# Patient Record
Sex: Female | Born: 1950 | Race: White | Hispanic: No | Smoking: Former smoker
Health system: Southern US, Community
[De-identification: ages and names within clinical notes are randomized; demographics above are authoritative.]

## PROBLEM LIST (undated history)

## (undated) DIAGNOSIS — I7300 Raynaud's syndrome without gangrene: Secondary | ICD-10-CM

## (undated) DIAGNOSIS — I495 Sick sinus syndrome: Secondary | ICD-10-CM

## (undated) DIAGNOSIS — Z950 Presence of cardiac pacemaker: Secondary | ICD-10-CM

## (undated) DIAGNOSIS — G4730 Sleep apnea, unspecified: Secondary | ICD-10-CM

## (undated) DIAGNOSIS — R9439 Abnormal result of other cardiovascular function study: Secondary | ICD-10-CM

## (undated) DIAGNOSIS — I480 Paroxysmal atrial fibrillation: Secondary | ICD-10-CM

## (undated) DIAGNOSIS — I499 Cardiac arrhythmia, unspecified: Secondary | ICD-10-CM

## (undated) DIAGNOSIS — K219 Gastro-esophageal reflux disease without esophagitis: Secondary | ICD-10-CM

## (undated) HISTORY — DX: Paroxysmal atrial fibrillation: I48.0

## (undated) HISTORY — PX: BUNIONECTOMY WITH WEIL OSTEOTOMY: SHX5604

## (undated) HISTORY — PX: BACK SURGERY: SHX140

## (undated) HISTORY — DX: Abnormal result of other cardiovascular function study: R94.39

## (undated) HISTORY — PX: CHOLECYSTECTOMY: SHX55

## (undated) HISTORY — DX: Raynaud's syndrome without gangrene: I73.00

## (undated) HISTORY — PX: TONSILLECTOMY: SUR1361

---

## 2003-10-10 HISTORY — PX: BREAST EXCISIONAL BIOPSY: SUR124

## 2004-05-31 ENCOUNTER — Encounter: Payer: Self-pay | Admitting: Cardiology

## 2009-06-01 ENCOUNTER — Encounter: Payer: Self-pay | Admitting: Cardiology

## 2009-08-12 ENCOUNTER — Encounter: Payer: Self-pay | Admitting: Cardiology

## 2009-08-17 ENCOUNTER — Encounter: Payer: Self-pay | Admitting: Cardiology

## 2009-08-17 ENCOUNTER — Ambulatory Visit: Payer: Self-pay | Admitting: Cardiology

## 2009-09-16 DIAGNOSIS — I10 Essential (primary) hypertension: Secondary | ICD-10-CM | POA: Insufficient documentation

## 2009-09-16 DIAGNOSIS — R002 Palpitations: Secondary | ICD-10-CM | POA: Insufficient documentation

## 2009-09-16 DIAGNOSIS — R0789 Other chest pain: Secondary | ICD-10-CM

## 2009-09-17 ENCOUNTER — Ambulatory Visit: Payer: Self-pay | Admitting: Cardiology

## 2011-10-10 HISTORY — PX: CARDIAC CATHETERIZATION: SHX172

## 2012-06-28 ENCOUNTER — Other Ambulatory Visit: Payer: Self-pay | Admitting: Cardiology

## 2012-06-28 ENCOUNTER — Encounter: Payer: Self-pay | Admitting: Cardiology

## 2012-06-28 DIAGNOSIS — R079 Chest pain, unspecified: Secondary | ICD-10-CM

## 2012-07-03 ENCOUNTER — Encounter: Payer: Self-pay | Admitting: Cardiology

## 2012-07-03 DIAGNOSIS — R079 Chest pain, unspecified: Secondary | ICD-10-CM

## 2012-08-09 ENCOUNTER — Ambulatory Visit (INDEPENDENT_AMBULATORY_CARE_PROVIDER_SITE_OTHER): Payer: BC Managed Care – PPO | Admitting: Cardiology

## 2012-08-09 ENCOUNTER — Encounter: Payer: Self-pay | Admitting: Cardiology

## 2012-08-09 ENCOUNTER — Encounter: Payer: Self-pay | Admitting: *Deleted

## 2012-08-09 ENCOUNTER — Other Ambulatory Visit: Payer: Self-pay | Admitting: Cardiology

## 2012-08-09 VITALS — BP 134/76 | HR 61 | Ht 69.0 in | Wt 180.0 lb

## 2012-08-09 DIAGNOSIS — R0789 Other chest pain: Secondary | ICD-10-CM

## 2012-08-09 DIAGNOSIS — R9439 Abnormal result of other cardiovascular function study: Secondary | ICD-10-CM | POA: Insufficient documentation

## 2012-08-09 DIAGNOSIS — R002 Palpitations: Secondary | ICD-10-CM

## 2012-08-09 MED ORDER — PANTOPRAZOLE SODIUM 40 MG PO TBEC: 40.0000 mg | DELAYED_RELEASE_TABLET | Freq: Every day | ORAL | Status: DC

## 2012-08-09 NOTE — Assessment & Plan Note (Signed)
Could be "false-positive" ST segment changes as detailed above. Needs further investigation.

## 2012-08-09 NOTE — Patient Instructions (Addendum)
Your physician recommends that you schedule a follow-up appointment in: 1 month. Your physician recommends that you continue on your current medications as directed. You have been provided a prescription for pantoprazole 40 mg daily.Please refer to the Current Medication list given to you today.  Your physician has requested that you have a stress echocardiogram. For further information please visit [email or web address] Please follow instruction sheet as given.

## 2012-08-09 NOTE — Progress Notes (Signed)
Clinical Summary Tiffany Melton is a 61 y.o.female referred back to the office by Dr. Dimas Aguas. She was seen previously by Dr. Myrtis Ser in December 2010 and has had history of chest pain as well as palpitations over the years. She had a low-risk nuclear imaging stress test at the time of her prior evaluation (abnormal ST segment changes but reassuring perfusion images), and no further cardiac workup was recommended at that time.  More recently she was admitted to [facility] back in September with recurrent atypical chest pain. She ruled out for myocardial infarction and was referred for a followup GXT on 9/25. She attained adequate heart rate response, 10.1 METs, dyspnea but no chest pain. Abnormal ST segment changes were noted reaching 3 mm depression in leads II, III, aVF, and V4 through V6 at peak heart rate, resolution 5 minutes. Hypertensive response noted.  We reviewed the results of her GXT today. She described her symptoms to me, tends to be a fairly sporadic chest pain, associated with feeling of palpitations, sometimes left arm discomfort. These episodes are nonexertional. Most recent one occurred in association with definite reflux symptoms with waterbrash. She took her husband's nitroglycerin with questionable relief, also took some of his Protonix.   No Known Allergies  Current Outpatient Prescriptions  Medication Sig Dispense Refill  . nitroGLYCERIN (NITROSTAT) 0.4 MG SL tablet Place 0.4 mg under the tongue every 5 (five) minutes as needed.      . pantoprazole (PROTONIX) 40 MG tablet Take 1 tablet (40 mg total) by mouth daily.  30 tablet  3  . DISCONTD: pantoprazole (PROTONIX) 40 MG tablet Take 40 mg by mouth daily.        Past Medical History  Diagnosis Date  . Palpitations   . Essential hypertension, benign   . Raynaud's disease   . Abnormal stress test     GXT 9/13    No past surgical history on file.  Family History  Problem Relation Age of Onset  . Hypertension      Social  History [name] reports that she quit smoking about 30 years ago. Her smoking use included Cigarettes. She started smoking about 49 years ago. She has a 30 pack-year smoking history. She has never used smokeless tobacco. [name] reports that she does not drink alcohol.  Review of Systems No syncope. No orthopnea or PND. No claudication. No fevers or chills. No cough. Otherwise negative.  Physical Examination Filed Vitals:   08/09/12 1350  BP: 134/76  Pulse: 61   Filed Weights   08/09/12 1350  Weight: 180 lb (81.647 kg)   Patient in no acute distress. HEENT: Conjunctiva and lids normal, oropharynx clear with moist mucosa. Neck: Supple, no elevated JVP or carotid bruits, no thyromegaly. Lungs: Clear to auscultation, nonlabored breathing at rest. Cardiac: Regular rate and rhythm, no S3 or significant systolic murmur, no pericardial rub. Abdomen: Soft, nontender, bowel sounds present. Extremities: No pitting edema, distal pulses 2+. Skin: Warm and dry. Musculoskeletal: No kyphosis. Neuropsychiatric: Alert and oriented x3, affect grossly appropriate.   Problem List and Plan   Atypical chest pain Sporadic, prolonged at times, sometimes associated with feeling of palpitations and left arm discomfort. One episode was associated with fairly typical reflux. She did have a recent abnormal GXT as noted, however back in 2010 also had abnormal ST segment changes with a reassuring myocardial perfusion study. Our plan is to initiate Protonix as empiric treatment for possible reflux/esophagitis, and refer her for an exercise echocardiogram to better  clarify possible ischemic etiology. If her echocardiographic images are reassuring, it is likely that the ST segment changes are a "false positive." If both imaging and ECG are more clearly abnormal, then we will consider cardiac catheterization. Office followup arranged.  Abnormal stress test Could be "false-positive" ST segment changes as detailed  above. Needs further investigation.  PALPITATIONS Possibility of transient arrhythmia is also to be considered. Once ischemic workup is complete, we might consider an outpatient monitor.    Jonelle Sidle, M.D., F.A.C.C.

## 2012-08-09 NOTE — Assessment & Plan Note (Signed)
Sporadic, prolonged at times, sometimes associated with feeling of palpitations and left arm discomfort. One episode was associated with fairly typical reflux. She did have a recent abnormal GXT as noted, however back in 2010 also had abnormal ST segment changes with a reassuring myocardial perfusion study. Our plan is to initiate Protonix as empiric treatment for possible reflux/esophagitis, and refer her for an exercise echocardiogram to better clarify possible ischemic etiology. If her echocardiographic images are reassuring, it is likely that the ST segment changes are a "false positive." If both imaging and ECG are more clearly abnormal, then we will consider cardiac catheterization. Office followup arranged.

## 2012-08-09 NOTE — Assessment & Plan Note (Signed)
Possibility of transient arrhythmia is also to be considered. Once ischemic workup is complete, we might consider an outpatient monitor.

## 2012-08-13 DIAGNOSIS — R079 Chest pain, unspecified: Secondary | ICD-10-CM

## 2012-08-16 ENCOUNTER — Telehealth: Payer: Self-pay | Admitting: Cardiology

## 2012-08-16 NOTE — Telephone Encounter (Signed)
The only other cardiac test to be considered now would be a diagnostic heart catheterization, which we did discuss briefly at her office visit. As I have already mentioned, her most recent exercise echocardiogram does not suggest that we will find significant obstructive CAD based on the echocardiographic images. If however she continues to have chest pain despite PPI and remains concerned, I would prefer to actually speak with her in the office for scheduling an invasive procedure that has risks and need to be clearly understood in advance.

## 2012-08-16 NOTE — Telephone Encounter (Signed)
Patient informed and states she continues to have the chest pain and its not any worse than before. Patient requesting to have any further testing that would be needed if suggested by MD without having to come back to office in between due to $70 copay. Nurse informed patient that follow up appointment is recommended and that MD would be notified of patients continued chest pain with protonix and concern about copay.

## 2012-08-16 NOTE — Telephone Encounter (Signed)
Patient called asking about results of stress test.  She states she is still having chest pain that she told Dr. Diona Browner about and states heart rate increases.  Patient indicated her copay is 70.00 and wanting to see if any additional workup is needed and can be done before the end of the year as her deductible is already met.

## 2012-08-16 NOTE — Telephone Encounter (Signed)
Message copied by Eustace Moore on Fri Aug 16, 2012  9:38 AM ------      Message from: MCDOWELL, Illene Bolus      Created: Wed Aug 14, 2012  8:48 AM       Study was reviewed. Although she has abnormal ST segment changes with exercise, documented on more than one occasion, her stress echocardiographic images were normal arguing against major obstructive CAD. Overall this is reassuring. Have her keep her followup visit to she how she is doing on Protonix.

## 2012-08-16 NOTE — Telephone Encounter (Signed)
Patient informed and verbalized understanding of plan. 

## 2012-08-26 ENCOUNTER — Encounter: Payer: Self-pay | Admitting: Cardiology

## 2012-08-26 ENCOUNTER — Encounter: Payer: Self-pay | Admitting: *Deleted

## 2012-08-26 ENCOUNTER — Telehealth: Payer: Self-pay | Admitting: Cardiology

## 2012-08-26 ENCOUNTER — Other Ambulatory Visit: Payer: Self-pay | Admitting: Cardiology

## 2012-08-26 ENCOUNTER — Ambulatory Visit (INDEPENDENT_AMBULATORY_CARE_PROVIDER_SITE_OTHER): Payer: BC Managed Care – PPO | Admitting: Cardiology

## 2012-08-26 VITALS — BP 131/82 | HR 69 | Ht 69.0 in | Wt 175.0 lb

## 2012-08-26 DIAGNOSIS — R072 Precordial pain: Secondary | ICD-10-CM

## 2012-08-26 DIAGNOSIS — R002 Palpitations: Secondary | ICD-10-CM

## 2012-08-26 DIAGNOSIS — Z01810 Encounter for preprocedural cardiovascular examination: Secondary | ICD-10-CM

## 2012-08-26 DIAGNOSIS — R9439 Abnormal result of other cardiovascular function study: Secondary | ICD-10-CM

## 2012-08-26 DIAGNOSIS — R0789 Other chest pain: Secondary | ICD-10-CM

## 2012-08-26 LAB — PROTIME-INR

## 2012-08-26 MED ORDER — ASPIRIN EC 81 MG PO TBEC: 81.0000 mg | DELAYED_RELEASE_TABLET | Freq: Every day | ORAL | Status: DC

## 2012-08-26 NOTE — Progress Notes (Signed)
 Clinical Summary Ms. Tobia is a 61 y.o.female presenting for office followup. She was seen in early November for followup of an abnormal GXT done back in September. She was reporting intermittent chest pain symptoms, fairly sporadic, also sense of palpitations and left arm discomfort. She had tried some of her husband's nitroglycerin and also Protonix.  We discussed followup testing, and proceeded with an exercise echocardiogram for more diagnostic sensitivity and specificity. She developed abnormal ST segment changes, approximately 3 mm ST segment depression in leads II, III, aVF, and V4 through V6 at peak with rare PVC and hypertensive response. No anginal chest pain was reported. The echocardiographic images were reassuring however demonstrating no inducible wall motion abnormalities with LVEF greater than 65%.  She presents today stating that she continues to have intermittent chest "burning and pressure," remains sporadic, not clearly exertional. The Protonix may have helped some but not completely. She also reports nitroglycerin responsiveness.  We reviewed the results of her stress testing, and in light of the fact she continues to manifest symptoms and remains very worried about her heart, plan is to pursue a diagnostic cardiac catheterization to clearly exclude the possibility of obstructive CAD that may need further management beyond medications. If this is not the case however, GI evaluation may be the next step.  No Known Allergies  Current Outpatient Prescriptions  Medication Sig Dispense Refill  . nitroGLYCERIN (NITROSTAT) 0.4 MG SL tablet Place 0.4 mg under the tongue every 5 (five) minutes as needed.      . pantoprazole (PROTONIX) 40 MG tablet Take 1 tablet (40 mg total) by mouth daily.  30 tablet  3  . aspirin EC 81 MG tablet Take 1 tablet (81 mg total) by mouth daily.        Past Medical History  Diagnosis Date  . Palpitations   . Essential hypertension, benign   .  Raynaud's disease   . Abnormal stress test     GXT 9/13    No past surgical history on file.  Family History  Problem Relation Age of Onset  . Hypertension      Social History Ms. Saine reports that she quit smoking about 30 years ago. Her smoking use included Cigarettes. She started smoking about 49 years ago. She has a 30 pack-year smoking history. She has never used smokeless tobacco. Ms. Erpelding reports that she does not drink alcohol.  Review of Systems She has cut back on caffeine, no progressive palpitations. Reports anxiety at times. No other major stressors. Otherwise negative.  Physical Examination Filed Vitals:   08/26/12 1050  BP: 131/82  Pulse: 69   Filed Weights   08/26/12 1050  Weight: 175 lb (79.379 kg)   Patient in no acute distress.  HEENT: Conjunctiva and lids normal, oropharynx clear with moist mucosa.  Neck: Supple, no elevated JVP or carotid bruits, no thyromegaly.  Lungs: Clear to auscultation, nonlabored breathing at rest.  Cardiac: Regular rate and rhythm, no S3 or significant systolic murmur, no pericardial rub.  Abdomen: Soft, nontender, bowel sounds present.  Extremities: No pitting edema, distal pulses 2+.  Skin: Warm and dry.  Musculoskeletal: No kyphosis.  Neuropsychiatric: Alert and oriented x3, affect grossly appropriate.  Problem List and Plan   Atypical chest pain Symptoms persist on Protonix, and patient reports having had responsiveness to nitroglycerin which she borrowed from her husband. She has clearly abnormal ST segment changes by stress testing, however recent stress echocardiogram showed normal echocardiographic images suggesting that these ST segment   changes may be "false positive." Having said that, she continues to manifest symptoms and remains very worried about her heart. We discussed the risks and benefits of diagnostic cardiac catheterization to clearly exclude obstructive CAD that may require further management, and she is in  agreement to proceed. Aspirin is being started, and baseline lab work be obtained. She has had a recent chest x-ray at [facility]. If her cardiac evaluation is reassuring, she may need GI evaluation.  Abnormal stress test Exercise echocardiogram noted above. Clearly abnormal ST segment changes, however reassuring echocardiographic images.  PALPITATIONS Reports no clear correlation with chest discomfort symptoms. She has cut back on caffeine. At this point arrhythmia not documented.    Harles Evetts G. Karmelo Bass, M.D., F.A.C.C.   

## 2012-08-26 NOTE — Assessment & Plan Note (Signed)
Exercise echocardiogram noted above. Clearly abnormal ST segment changes, however reassuring echocardiographic images.

## 2012-08-26 NOTE — Patient Instructions (Addendum)
   Left JV heart cath - see info sheet given  Begin Aspirin 81mg  daily Continue all other current medications. Follow up to be given after procedure above

## 2012-08-26 NOTE — Telephone Encounter (Signed)
Pt has [employer], no precert for heart cath.

## 2012-08-26 NOTE — Telephone Encounter (Signed)
Heart catherization scheduled for 08-30-12 by Dr. Antoine Poche JV lab 9:30am

## 2012-08-26 NOTE — Assessment & Plan Note (Signed)
Symptoms persist on Protonix, and patient reports having had responsiveness to nitroglycerin which she borrowed from her husband. She has clearly abnormal ST segment changes by stress testing, however recent stress echocardiogram showed normal echocardiographic images suggesting that these ST segment changes may be "false positive." Having said that, she continues to manifest symptoms and remains very worried about her heart. We discussed the risks and benefits of diagnostic cardiac catheterization to clearly exclude obstructive CAD that may require further management, and she is in agreement to proceed. Aspirin is being started, and baseline lab work be obtained. She has had a recent chest x-ray at [facility]. If her cardiac evaluation is reassuring, she may need GI evaluation.

## 2012-08-26 NOTE — Assessment & Plan Note (Signed)
Reports no clear correlation with chest discomfort symptoms. She has cut back on caffeine. At this point arrhythmia not documented.

## 2012-08-30 ENCOUNTER — Inpatient Hospital Stay (HOSPITAL_BASED_OUTPATIENT_CLINIC_OR_DEPARTMENT_OTHER)
Admission: RE | Admit: 2012-08-30 | Discharge: 2012-08-30 | Disposition: A | Discharge status: Home / Self Care | Payer: BC Managed Care – PPO | Source: Ambulatory Visit | Attending: Cardiology | Admitting: Cardiology

## 2012-08-30 ENCOUNTER — Encounter (HOSPITAL_BASED_OUTPATIENT_CLINIC_OR_DEPARTMENT_OTHER)
Admission: RE | Disposition: A | Discharge status: Home / Self Care | Payer: Self-pay | Source: Ambulatory Visit | Attending: Cardiology

## 2012-08-30 DIAGNOSIS — R072 Precordial pain: Secondary | ICD-10-CM

## 2012-08-30 DIAGNOSIS — I10 Essential (primary) hypertension: Secondary | ICD-10-CM | POA: Insufficient documentation

## 2012-08-30 DIAGNOSIS — I7300 Raynaud's syndrome without gangrene: Secondary | ICD-10-CM | POA: Insufficient documentation

## 2012-08-30 DIAGNOSIS — R079 Chest pain, unspecified: Secondary | ICD-10-CM | POA: Insufficient documentation

## 2012-08-30 SURGERY — JV LEFT HEART CATHETERIZATION WITH CORONARY ANGIOGRAM
Anesthesia: Moderate Sedation

## 2012-08-30 MED ORDER — ASPIRIN 81 MG PO CHEW
324.0000 mg | CHEWABLE_TABLET | ORAL | Status: AC
  Administered 2012-08-30: 324 mg via ORAL

## 2012-08-30 MED ORDER — SODIUM CHLORIDE 0.9 % IV SOLN: INTRAVENOUS | Status: DC

## 2012-08-30 MED ORDER — DIAZEPAM 5 MG PO TABS
5.0000 mg | ORAL_TABLET | ORAL | Status: AC
  Administered 2012-08-30: 5 mg via ORAL

## 2012-08-30 MED ORDER — SODIUM CHLORIDE 0.9 % IV SOLN: 1.0000 mL/kg/h | INTRAVENOUS | Status: DC

## 2012-08-30 MED ORDER — ONDANSETRON HCL 4 MG/2ML IJ SOLN: 4.0000 mg | Freq: Four times a day (QID) | INTRAMUSCULAR | Status: DC | PRN

## 2012-08-30 MED ORDER — ACETAMINOPHEN 325 MG PO TABS: 650.0000 mg | ORAL_TABLET | ORAL | Status: DC | PRN

## 2012-08-30 NOTE — OR Nursing (Signed)
Discharge instructions reviewed and signed, pt stated understanding, ambulated in hall without difficulty, site level 0, transported to son's car via wheelchair 

## 2012-08-30 NOTE — OR Nursing (Signed)
Dr Hochrein at bedside to discuss results and treatment plan with pt and family 

## 2012-08-30 NOTE — OR Nursing (Signed)
Tegaderm dressing applied, site level 0, bedrest begins at 100

## 2012-08-30 NOTE — OR Nursing (Signed)
Bedrest begins at 1055

## 2012-08-30 NOTE — Interval H&P Note (Signed)
History and Physical Interval Note:  08/30/2012 10:52 AM  Tiffany Melton  has presented today for surgery, with the diagnosis of abnormal stress, chest pain  The various methods of treatment have been discussed with the patient and family. After consideration of risks, benefits and other options for treatment, the patient has consented to  Procedure(s) (LRB) with comments: JV LEFT HEART CATHETERIZATION WITH CORONARY ANGIOGRAM (N/A) as a surgical intervention .  The patient's history has been reviewed, patient examined, no change in status, stable for surgery.  I have reviewed the patient's chart and labs.  Questions were answered to the patient's satisfaction.     Rollene Rotunda

## 2012-08-30 NOTE — CV Procedure (Signed)
  Cardiac Catheterization Procedure Note  Name: EMRYN HIOTT MRN: [id number] DOB: [birth year]  Procedure: Left Heart Cath, Selective Coronary Angiography, LV angiography  Indication:  Chest pain suggestive of unstable angina.   Procedural details: The right groin was prepped, draped, and anesthetized with 1% lidocaine. Using modified Seldinger technique, a 4 French sheath was introduced into the right femoral artery. Standard Judkins catheters were used for coronary angiography and left ventriculography. Catheter exchanges were performed over a guidewire. There were no immediate procedural complications. The patient was transferred to the post catheterization recovery area for further monitoring.  I did attempt radial access.  However, I was unable to cannulate the radial artery.    Procedural Findings:   Hemodynamics:     AO 123/88    LV 127/12   Coronary angiography:   Coronary dominance: Right  Left mainstem:   LM normal  Left anterior descending (LAD):   Moderate sized vessel.  Normal throughout.  Mid diagonal large and normal.  Left circumflex (LCx):  AV groove normal.  MOM moderate sized and normal.  PL small and normal.   Right coronary artery (RCA):  Very large and dominant.  Normal.  PDA large and normal.  PL moderate sized and normal  Left ventriculography: Left ventricular systolic function is normal, LVEF is estimated at 65%, there is no significant mitral regurgitation   Final Conclusions:  Normal coronaries.  NL LV function.  Recommendations: No further cardiac work up.    Rollene Rotunda 08/30/2012, 10:43 AM

## 2012-08-30 NOTE — H&P (View-Only) (Signed)
Clinical Summary Tiffany Melton is a 61 y.o.female presenting for office followup. She was seen in early November for followup of an abnormal GXT done back in September. She was reporting intermittent chest pain symptoms, fairly sporadic, also sense of palpitations and left arm discomfort. She had tried some of her husband's nitroglycerin and also Protonix.  We discussed followup testing, and proceeded with an exercise echocardiogram for more diagnostic sensitivity and specificity. She developed abnormal ST segment changes, approximately 3 mm ST segment depression in leads II, III, aVF, and V4 through V6 at peak with rare PVC and hypertensive response. No anginal chest pain was reported. The echocardiographic images were reassuring however demonstrating no inducible wall motion abnormalities with LVEF greater than 65%.  She presents today stating that she continues to have intermittent chest "burning and pressure," remains sporadic, not clearly exertional. The Protonix may have helped some but not completely. She also reports nitroglycerin responsiveness.  We reviewed the results of her stress testing, and in light of the fact she continues to manifest symptoms and remains very worried about her heart, plan is to pursue a diagnostic cardiac catheterization to clearly exclude the possibility of obstructive CAD that may need further management beyond medications. If this is not the case however, GI evaluation may be the next step.  No Known Allergies  Current Outpatient Prescriptions  Medication Sig Dispense Refill  . nitroGLYCERIN (NITROSTAT) 0.4 MG SL tablet Place 0.4 mg under the tongue every 5 (five) minutes as needed.      . pantoprazole (PROTONIX) 40 MG tablet Take 1 tablet (40 mg total) by mouth daily.  30 tablet  3  . aspirin EC 81 MG tablet Take 1 tablet (81 mg total) by mouth daily.        Past Medical History  Diagnosis Date  . Palpitations   . Essential hypertension, benign   .  Raynaud's disease   . Abnormal stress test     GXT 9/13    No past surgical history on file.  Family History  Problem Relation Age of Onset  . Hypertension      Social History Ms. Anagnos reports that she quit smoking about 30 years ago. Her smoking use included Cigarettes. She started smoking about 49 years ago. She has a 30 pack-year smoking history. She has never used smokeless tobacco. Ms. Frederking reports that she does not drink alcohol.  Review of Systems She has cut back on caffeine, no progressive palpitations. Reports anxiety at times. No other major stressors. Otherwise negative.  Physical Examination Filed Vitals:   08/26/12 1050  BP: 131/82  Pulse: 69   Filed Weights   08/26/12 1050  Weight: 175 lb (79.379 kg)   Patient in no acute distress.  HEENT: Conjunctiva and lids normal, oropharynx clear with moist mucosa.  Neck: Supple, no elevated JVP or carotid bruits, no thyromegaly.  Lungs: Clear to auscultation, nonlabored breathing at rest.  Cardiac: Regular rate and rhythm, no S3 or significant systolic murmur, no pericardial rub.  Abdomen: Soft, nontender, bowel sounds present.  Extremities: No pitting edema, distal pulses 2+.  Skin: Warm and dry.  Musculoskeletal: No kyphosis.  Neuropsychiatric: Alert and oriented x3, affect grossly appropriate.  Problem List and Plan   Atypical chest pain Symptoms persist on Protonix, and patient reports having had responsiveness to nitroglycerin which she borrowed from her husband. She has clearly abnormal ST segment changes by stress testing, however recent stress echocardiogram showed normal echocardiographic images suggesting that these ST segment  changes may be "false positive." Having said that, she continues to manifest symptoms and remains very worried about her heart. We discussed the risks and benefits of diagnostic cardiac catheterization to clearly exclude obstructive CAD that may require further management, and she is in  agreement to proceed. Aspirin is being started, and baseline lab work be obtained. She has had a recent chest x-ray at [facility]. If her cardiac evaluation is reassuring, she may need GI evaluation.  Abnormal stress test Exercise echocardiogram noted above. Clearly abnormal ST segment changes, however reassuring echocardiographic images.  PALPITATIONS Reports no clear correlation with chest discomfort symptoms. She has cut back on caffeine. At this point arrhythmia not documented.    Jonelle Sidle, M.D., F.A.C.C.

## 2012-09-19 ENCOUNTER — Ambulatory Visit: Payer: BC Managed Care – PPO | Admitting: Cardiology

## 2012-11-23 ENCOUNTER — Other Ambulatory Visit: Payer: Self-pay

## 2013-08-14 ENCOUNTER — Other Ambulatory Visit: Payer: Self-pay

## 2014-04-30 ENCOUNTER — Encounter (HOSPITAL_COMMUNITY): Payer: Self-pay | Admitting: *Deleted

## 2014-04-30 ENCOUNTER — Inpatient Hospital Stay (HOSPITAL_COMMUNITY)
Admission: AD | Admit: 2014-04-30 | Discharge: 2014-05-05 | DRG: 243 | Disposition: A | Discharge status: Home / Self Care | Payer: BC Managed Care – PPO | Source: Other Acute Inpatient Hospital | Attending: Cardiology | Admitting: Cardiology

## 2014-04-30 DIAGNOSIS — I10 Essential (primary) hypertension: Secondary | ICD-10-CM | POA: Diagnosis present

## 2014-04-30 DIAGNOSIS — R55 Syncope and collapse: Secondary | ICD-10-CM

## 2014-04-30 DIAGNOSIS — Z7982 Long term (current) use of aspirin: Secondary | ICD-10-CM

## 2014-04-30 DIAGNOSIS — I455 Other specified heart block: Secondary | ICD-10-CM

## 2014-04-30 DIAGNOSIS — I4891 Unspecified atrial fibrillation: Secondary | ICD-10-CM

## 2014-04-30 DIAGNOSIS — R079 Chest pain, unspecified: Secondary | ICD-10-CM

## 2014-04-30 DIAGNOSIS — Z87891 Personal history of nicotine dependence: Secondary | ICD-10-CM

## 2014-04-30 DIAGNOSIS — Z79899 Other long term (current) drug therapy: Secondary | ICD-10-CM

## 2014-04-30 DIAGNOSIS — I959 Hypotension, unspecified: Secondary | ICD-10-CM

## 2014-04-30 DIAGNOSIS — R002 Palpitations: Secondary | ICD-10-CM

## 2014-04-30 DIAGNOSIS — I480 Paroxysmal atrial fibrillation: Secondary | ICD-10-CM

## 2014-04-30 DIAGNOSIS — I7300 Raynaud's syndrome without gangrene: Secondary | ICD-10-CM | POA: Diagnosis present

## 2014-04-30 DIAGNOSIS — I4892 Unspecified atrial flutter: Secondary | ICD-10-CM | POA: Diagnosis present

## 2014-04-30 DIAGNOSIS — I495 Sick sinus syndrome: Principal | ICD-10-CM | POA: Diagnosis present

## 2014-04-30 HISTORY — DX: Sick sinus syndrome: I49.5

## 2014-04-30 MED ORDER — SODIUM CHLORIDE 0.9 % IV SOLN
INTRAVENOUS | Status: DC
  Administered 2014-04-30: 23:00:00 via INTRAVENOUS

## 2014-04-30 MED ORDER — POTASSIUM CHLORIDE CRYS ER 20 MEQ PO TBCR
40.0000 meq | EXTENDED_RELEASE_TABLET | Freq: Once | ORAL | Status: AC
  Administered 2014-05-01: 40 meq via ORAL
  Filled 2014-04-30: qty 2

## 2014-05-01 ENCOUNTER — Inpatient Hospital Stay (HOSPITAL_COMMUNITY): Payer: BC Managed Care – PPO

## 2014-05-01 ENCOUNTER — Encounter (HOSPITAL_COMMUNITY): Payer: Self-pay | Admitting: Internal Medicine

## 2014-05-01 DIAGNOSIS — R079 Chest pain, unspecified: Secondary | ICD-10-CM

## 2014-05-01 DIAGNOSIS — I480 Paroxysmal atrial fibrillation: Secondary | ICD-10-CM

## 2014-05-01 DIAGNOSIS — I4891 Unspecified atrial fibrillation: Secondary | ICD-10-CM

## 2014-05-01 DIAGNOSIS — I455 Other specified heart block: Secondary | ICD-10-CM

## 2014-05-01 DIAGNOSIS — R55 Syncope and collapse: Secondary | ICD-10-CM

## 2014-05-01 DIAGNOSIS — R002 Palpitations: Secondary | ICD-10-CM

## 2014-05-01 DIAGNOSIS — I495 Sick sinus syndrome: Secondary | ICD-10-CM

## 2014-05-01 LAB — CBC WITH DIFFERENTIAL/PLATELET
Basophils Absolute: 0 10*3/uL (ref 0.0–0.1)
Basophils Relative: 0 % (ref 0–1)
EOS ABS: 0.1 10*3/uL (ref 0.0–0.7)
EOS PCT: 1 % (ref 0–5)
HEMATOCRIT: 36.5 % (ref 36.0–46.0)
HEMOGLOBIN: 11.8 g/dL — AB (ref 12.0–15.0)
LYMPHS ABS: 2 10*3/uL (ref 0.7–4.0)
Lymphocytes Relative: 35 % (ref 12–46)
MCH: 31.5 pg (ref 26.0–34.0)
MCHC: 32.3 g/dL (ref 30.0–36.0)
MCV: 97.3 fL (ref 78.0–100.0)
MONO ABS: 0.6 10*3/uL (ref 0.1–1.0)
MONOS PCT: 11 % (ref 3–12)
Neutro Abs: 3 10*3/uL (ref 1.7–7.7)
Neutrophils Relative %: 53 % (ref 43–77)
Platelets: 177 10*3/uL (ref 150–400)
RBC: 3.75 MIL/uL — AB (ref 3.87–5.11)
RDW: 12.8 % (ref 11.5–15.5)
WBC: 5.7 10*3/uL (ref 4.0–10.5)

## 2014-05-01 LAB — BASIC METABOLIC PANEL
Anion gap: 11 (ref 5–15)
BUN: 10 mg/dL (ref 6–23)
CO2: 23 mEq/L (ref 19–32)
CREATININE: 0.57 mg/dL (ref 0.50–1.10)
Calcium: 8.1 mg/dL — ABNORMAL LOW (ref 8.4–10.5)
Chloride: 109 mEq/L (ref 96–112)
GFR calc Af Amer: >90 mL/min (ref >90)
GLUCOSE: 81 mg/dL (ref 70–99)
POTASSIUM: 4.2 meq/L (ref 3.7–5.3)
Sodium: 143 mEq/L (ref 137–147)

## 2014-05-01 LAB — HEMOGLOBIN A1C
HEMOGLOBIN A1C: 5.7 % — AB (ref <5.7)
Mean Plasma Glucose: 117 mg/dL — ABNORMAL HIGH (ref <117)

## 2014-05-01 LAB — TROPONIN I: Troponin I: <0.3 ng/mL (ref <0.30)

## 2014-05-01 LAB — LIPID PANEL
CHOL/HDL RATIO: 3.5 ratio
Cholesterol: 195 mg/dL (ref 0–200)
HDL: 55 mg/dL (ref >39)
LDL CALC: 126 mg/dL — AB (ref 0–99)
TRIGLYCERIDES: 69 mg/dL (ref <150)
VLDL: 14 mg/dL (ref 0–40)

## 2014-05-01 LAB — PRO B NATRIURETIC PEPTIDE: Pro B Natriuretic peptide (BNP): 490.1 pg/mL — ABNORMAL HIGH (ref 0–125)

## 2014-05-01 LAB — PROTIME-INR
INR: 1 (ref 0.00–1.49)
Prothrombin Time: 13.2 seconds (ref 11.6–15.2)

## 2014-05-01 LAB — D-DIMER, QUANTITATIVE (NOT AT ARMC)

## 2014-05-01 LAB — MRSA PCR SCREENING: MRSA BY PCR: POSITIVE — AB

## 2014-05-01 MED ORDER — SODIUM CHLORIDE 0.9 % IV SOLN: 250.0000 mL | INTRAVENOUS | Status: DC | PRN

## 2014-05-01 MED ORDER — ACETAMINOPHEN 325 MG PO TABS
650.0000 mg | ORAL_TABLET | ORAL | Status: DC | PRN
  Administered 2014-05-01 – 2014-05-02 (×2): 650 mg via ORAL
  Filled 2014-05-01 (×2): qty 2

## 2014-05-01 MED ORDER — IOHEXOL 350 MG/ML SOLN
100.0000 mL | Freq: Once | INTRAVENOUS | Status: AC | PRN
  Administered 2014-05-01: 100 mL via INTRAVENOUS

## 2014-05-01 MED ORDER — SODIUM CHLORIDE 0.9 % IV BOLUS (SEPSIS)
500.0000 mL | Freq: Once | INTRAVENOUS | Status: AC
  Administered 2014-05-01: 500 mL via INTRAVENOUS

## 2014-05-01 MED ORDER — METOPROLOL TARTRATE 1 MG/ML IV SOLN
INTRAVENOUS | Status: AC
  Administered 2014-05-01: 5 mg
  Filled 2014-05-01: qty 5

## 2014-05-01 MED ORDER — CHLORHEXIDINE GLUCONATE CLOTH 2 % EX PADS
6.0000 | MEDICATED_PAD | Freq: Every day | CUTANEOUS | Status: AC
  Administered 2014-05-01 – 2014-05-05 (×5): 6 via TOPICAL

## 2014-05-01 MED ORDER — NITROGLYCERIN 0.4 MG SL SUBL
SUBLINGUAL_TABLET | SUBLINGUAL | Status: AC
  Administered 2014-05-01: 0.4 mg
  Filled 2014-05-01: qty 1

## 2014-05-01 MED ORDER — BIOTENE DRY MOUTH MT LIQD
15.0000 mL | Freq: Two times a day (BID) | OROMUCOSAL | Status: DC
  Administered 2014-05-02 (×2): 15 mL via OROMUCOSAL

## 2014-05-01 MED ORDER — SODIUM CHLORIDE 0.9 % IJ SOLN: 3.0000 mL | INTRAMUSCULAR | Status: DC | PRN

## 2014-05-01 MED ORDER — MUPIROCIN 2 % EX OINT
1.0000 "application " | TOPICAL_OINTMENT | Freq: Two times a day (BID) | CUTANEOUS | Status: DC
  Administered 2014-05-01 – 2014-05-04 (×7): 1 via NASAL
  Filled 2014-05-01: qty 22

## 2014-05-01 MED ORDER — DOPAMINE-DEXTROSE 3.2-5 MG/ML-% IV SOLN
INTRAVENOUS | Status: AC
  Administered 2014-05-01: 2 ug/kg/min via INTRAVENOUS
  Filled 2014-05-01: qty 250

## 2014-05-01 MED ORDER — ASPIRIN 325 MG PO TABS
325.0000 mg | ORAL_TABLET | Freq: Once | ORAL | Status: AC
  Administered 2014-05-01: 325 mg via ORAL
  Filled 2014-05-01: qty 1

## 2014-05-01 MED ORDER — COSYNTROPIN 0.25 MG IJ SOLR
0.2500 mg | Freq: Once | INTRAMUSCULAR | Status: AC
  Administered 2014-05-01: 0.25 mg via INTRAVENOUS
  Filled 2014-05-01: qty 0.25

## 2014-05-01 MED ORDER — ASPIRIN EC 81 MG PO TBEC
81.0000 mg | DELAYED_RELEASE_TABLET | Freq: Every day | ORAL | Status: DC
  Administered 2014-05-01 – 2014-05-04 (×4): 81 mg via ORAL
  Filled 2014-05-01 (×5): qty 1

## 2014-05-01 MED ORDER — SODIUM CHLORIDE 0.9 % IJ SOLN
3.0000 mL | Freq: Two times a day (BID) | INTRAMUSCULAR | Status: DC
  Administered 2014-05-01 (×3): 3 mL via INTRAVENOUS

## 2014-05-01 MED ORDER — ONDANSETRON HCL 4 MG/2ML IJ SOLN: 4.0000 mg | Freq: Four times a day (QID) | INTRAMUSCULAR | Status: DC | PRN

## 2014-05-01 MED ORDER — DOPAMINE-DEXTROSE 3.2-5 MG/ML-% IV SOLN
2.0000 ug/kg/min | INTRAVENOUS | Status: DC
  Administered 2014-05-01: 2 ug/kg/min via INTRAVENOUS
  Administered 2014-05-01: 5 ug/kg/min via INTRAVENOUS
  Filled 2014-05-01: qty 250

## 2014-05-01 NOTE — Plan of Care (Signed)
Problem: Phase I Progression Outcomes Goal: Hemodynamically stable Outcome: Progressing Pauses resolved remains on dopamine

## 2014-05-01 NOTE — Progress Notes (Signed)
  Echocardiogram 2D Echocardiogram has been performed.  Mauricio Po 05/01/2014, 3:27 PM

## 2014-05-01 NOTE — Progress Notes (Signed)
Discussed case with Jolyn Nap of electrophysiology.  We will try to determine the underlying etiology for her hypotension which is currently persistent despite normal heart rate.  She did have atrial fibrillation briefly with sinus pauses. Ultimately she may require pacemaker but for now she is stable and we will try to determine hypotension etiology.  Checking cosyntropin stim test TSH normal Checking d-dimer - if elevated, CTA to exclude pulmonary embolism Checking echocardiogram Currently receiving IV fluid boluses  Afebrile, no white count No overt signs of infection. No rashes.  Exam is benign.  Trying to wean dopamine.  We will continue to follow. Please see history and physical from Dr. Jules Husbands earlier this morning.  Candee Furbish, MD

## 2014-05-01 NOTE — Progress Notes (Signed)
Written for NS bolus 500 ml. Reviewed H&P.  Candee Furbish, MD

## 2014-05-01 NOTE — H&P (Addendum)
Tiffany Melton is an 63 y.o. female.    Chief Complaint: dizziness and syncope Primary Cardiologist: Dr. Domenic Polite  HPI: Tiffany Melton is a 63 yo woman with PMH of Raynaud's disease, previous abnormal stress test and subsequent LHC (negative) 11/13 who presents with chest pain, dizziness and syncope. Tiffany Melton tells me she's been feeling less "right" over the last 6 months. She is able to walk as far as she likes and she's always on the go; however she has more fatigue and "gives out more" than she used to. However, today around 3pm she began experiencing some chest tightness and dizziness and ultimately was speaking to her daughter on the phone when she told her something was wrong and her husband saw her pass out.  She went to [facility] where she was noted to have atrial fibrillation + RVR to 170s and received 10 mg IV diltiazem. She had improved rates and actually had 2x2 second pauses and 1x4 second pause with associated dizziness and chest tightness. She was put on pacer pads with a back up rate of 50 and shortly thereafter she received several very painful jolts that apparently was pacing from the pacer/defibrillation per report and she went into sinus bradycardia. She's felt much improved since. She characterized her chest tightness as a tightness, left upper chest and felt like she couldn't take a deep breath, however, she didn't have chest tightness with a deep breath. During my exam she's been chest pain free. She tells me she doesn't and has not taken any other the counter herbals, weight loss drugs or other supplements.    Past Medical History  Diagnosis Date  . Palpitations   . Essential hypertension, benign   . Raynaud's disease   . Abnormal stress test     GXT 9/13    Past Surgical History  Procedure Laterality Date  . Back surgery    . Tonsillectomy    . Cholecystectomy    . Cardiac catheterization      2 years ago    Family History  Problem Relation Age of Onset  .  Hypertension     Social History:  reports that she quit smoking about 31 years ago. Her smoking use included Cigarettes. She started smoking about 51 years ago. She has a 35 pack-year smoking history. She has never used smokeless tobacco. She reports that she does not drink alcohol or use illicit drugs.  Allergies: No Known Allergies  Medications Prior to Admission  Medication Sig Dispense Refill  . aspirin EC 81 MG tablet Take 1 tablet (81 mg total) by mouth daily.      . nitroGLYCERIN (NITROSTAT) 0.4 MG SL tablet Place 0.4 mg under the tongue every 5 (five) minutes as needed.      . pantoprazole (PROTONIX) 40 MG tablet Take 1 tablet (40 mg total) by mouth daily.  30 tablet  3    No results found for this or any previous visit (from the past 48 hour(s)). No results found.  Review of Systems  Constitutional: Positive for malaise/fatigue. Negative for fever, chills and weight loss.  HENT: Negative for ear discharge.   Eyes: Negative for blurred vision, double vision and discharge.  Respiratory: Negative for cough and hemoptysis.   Cardiovascular: Positive for chest pain and palpitations. Negative for orthopnea and leg swelling.  Gastrointestinal: Negative for nausea, vomiting and diarrhea.  Genitourinary: Negative for frequency and hematuria.  Musculoskeletal: Negative for myalgias and neck pain.  Skin: Negative for rash.  Neurological:  Positive for dizziness and loss of consciousness. Negative for tremors, sensory change and headaches.  Endo/Heme/Allergies: Negative for polydipsia. Does not bruise/bleed easily.  Psychiatric/Behavioral: Negative for depression and suicidal ideas.    Blood pressure 102/67, pulse 61, temperature 98.2 F (36.8 C), temperature source Oral, resp. rate 13, height 5' 8"  (1.727 m), weight 85.5 kg (188 lb 7.9 oz), SpO2 100.00%. Physical Exam  Nursing note and vitals reviewed. Constitutional: She is oriented to person, place, and time. She appears  well-developed and well-nourished. No distress.  HENT:  Head: Normocephalic and atraumatic.  Nose: Nose normal.  Mouth/Throat: Oropharynx is clear and moist. No oropharyngeal exudate.  Eyes: Conjunctivae and EOM are normal. Pupils are equal, round, and reactive to light. No scleral icterus.  Neck: Normal range of motion. Neck supple. No JVD present. No tracheal deviation present.  Cardiovascular: Regular rhythm, normal heart sounds and intact distal pulses.  Exam reveals no gallop.   No murmur heard. bradycardia  Respiratory: Effort normal and breath sounds normal. No respiratory distress. She has no wheezes. She has no rales.  GI: Soft. Bowel sounds are normal. She exhibits no distension. There is no tenderness. There is no rebound.  Musculoskeletal: Normal range of motion. She exhibits no edema and no tenderness.  Neurological: She is alert and oriented to person, place, and time. No cranial nerve deficit.  Skin: Skin is warm and dry. No rash noted. She is not diaphoretic. No erythema.  Psychiatric: She has a normal mood and affect. Her behavior is normal. Thought content normal.  labs reviewed from [facility]; INR 0.9, bun/cr 0.59, albumin 4.1, ca 8.8, ast/alt 25/20, alk phos 93, na 140, K 3.4, co2 30, tsh 5.46, ck 162, CKMB 2.5, Trop I <0.01, wbc 7.3, h/h 14/41.6, plt 201 ECG strips reviewed; multiple pauses as long as 6 seconds, several at 4 ECG there afib + RVR 150ish ECG here at sinus bradycardia CXR [place]: no acute process  Assessment/Plan 63 yo woman with PMH of raynauds, previous 11/13 negative LHC who presents with dizziness, syncope and chest tightness.  1. Syncope: differential is hypovolemia, vasovagal, arrhythmia, neurogenic among others. Given pauses, it seems that sinus arrest is likely culprit. However, She did receive 10 mg IV diltiazem before the ECG findings per report. Going to the bathroom with supervision here in sinus bradycardia she remains lightheaded. Will observe  on telemetry in ICU, no AV nodal blockers, echocardiogram in AM. Strongly consider pacemaker. Discussed differential and potential need for PM with patient/family. NPO after MN.  2. Atrial fibrillation with RVR: she converted spontaneously or with attempted back-up pacing at [facility] CHADS2VASC of 2 for age > 73 and female (could be 3 if hypertension). She has an indication for AV nodal blockade for rate control and will likely need pacemaker. Defer anticoagulation for potential procedure but NOAC seem like a good option.  3. Chest pain: seems to be related to sinus arrest and atrial fibrillation with RVR as she also experienced palpitations. She had a negative cath 11/13 and she hasn't had chest pain until today. Currently resolved. Will obtain AM troponin to confirm.    Domenick Quebedeaux 05/01/2014, 12:58 AM Addendum: She was very dizzy getting up to go to the bathroom. No block or sinus pauses/arrest. She had variable lowish blood pressure and ultimately had maps repeated dropping into 40s so low dose dopamine started.

## 2014-05-01 NOTE — Progress Notes (Addendum)
Slowly weaning off Dopamime  HR is stable.   BP improved with IVF boluses.    Discussed with Dr. Caryl Comes. May not ultimately need pacer.  Will monitor.   ?reason for hypotension WBC normal TSH normal No fever Stim test results pending ECHO normal function. Can not rule out mass in ascending aorta. Recommend CTA.   I will order a gated cardiac CT to evaluate aortic root. Discussed with Dr. Weber Cooks and Dr. Aundra Dubin.    Candee Furbish, MD

## 2014-05-01 NOTE — Care Management Note (Signed)
    Page 1 of 1   05/01/2014     8:46:46 AM CARE MANAGEMENT NOTE 05/01/2014  Patient:  HARTLEE, AMEDEE   Account Number:  [id number]  Date Initiated:  05/01/2014  Documentation initiated by:  Elissa Hefty  Subjective/Objective Assessment:   adm w sinus node dysfunction     Action/Plan:   lives w husband, pcp dr Lennette Bihari howard   Anticipated DC Date:     Anticipated DC Plan:           Choice offered to / List presented to:             Status of service:   Medicare Important Message given?   (If response is "NO", the following Medicare IM given date fields will be blank) Date Medicare IM given:   Medicare IM given by:   Date Additional Medicare IM given:   Additional Medicare IM given by:    Discharge Disposition:    Per UR Regulation:  Reviewed for med. necessity/level of care/duration of stay  If discussed at [employer] of Stay Meetings, dates discussed:    Comments:

## 2014-05-02 DIAGNOSIS — I455 Other specified heart block: Secondary | ICD-10-CM

## 2014-05-02 DIAGNOSIS — I4891 Unspecified atrial fibrillation: Secondary | ICD-10-CM

## 2014-05-02 LAB — TROPONIN I: Troponin I: <0.3 ng/mL (ref <0.30)

## 2014-05-02 LAB — COMPREHENSIVE METABOLIC PANEL
ALK PHOS: 91 U/L (ref 39–117)
ALT: 15 U/L (ref 0–35)
AST: 21 U/L (ref 0–37)
Albumin: 3.3 g/dL — ABNORMAL LOW (ref 3.5–5.2)
Anion gap: 15 (ref 5–15)
BILIRUBIN TOTAL: 0.4 mg/dL (ref 0.3–1.2)
BUN: 9 mg/dL (ref 6–23)
CO2: 24 mEq/L (ref 19–32)
Calcium: 8.4 mg/dL (ref 8.4–10.5)
Chloride: 103 mEq/L (ref 96–112)
Creatinine, Ser: 0.48 mg/dL — ABNORMAL LOW (ref 0.50–1.10)
GFR calc Af Amer: >90 mL/min (ref >90)
GFR calc non Af Amer: >90 mL/min (ref >90)
GLUCOSE: 103 mg/dL — AB (ref 70–99)
POTASSIUM: 3.5 meq/L — AB (ref 3.7–5.3)
Sodium: 142 mEq/L (ref 137–147)
Total Protein: 6.6 g/dL (ref 6.0–8.3)

## 2014-05-02 LAB — ACTH STIMULATION, 3 TIME POINTS
Cortisol, 30 Min: 21.4 ug/dL (ref >20.0)
Cortisol, 60 Min: 24.3 ug/dL (ref >20)
Cortisol, Base: 17.8 ug/dL

## 2014-05-02 LAB — CBC
HEMATOCRIT: 38.8 % (ref 36.0–46.0)
HEMOGLOBIN: 12.8 g/dL (ref 12.0–15.0)
MCH: 31.8 pg (ref 26.0–34.0)
MCHC: 33 g/dL (ref 30.0–36.0)
MCV: 96.3 fL (ref 78.0–100.0)
Platelets: 207 10*3/uL (ref 150–400)
RBC: 4.03 MIL/uL (ref 3.87–5.11)
RDW: 12.4 % (ref 11.5–15.5)
WBC: 6.8 10*3/uL (ref 4.0–10.5)

## 2014-05-02 MED ORDER — SODIUM CHLORIDE 0.9 % IV SOLN
INTRAVENOUS | Status: DC
  Administered 2014-05-03: 75 mL via INTRAVENOUS

## 2014-05-02 NOTE — Progress Notes (Signed)
    Subjective:  Denies CP or dyspnea; no fever, dysuria, chills, productive cough, melena   Objective:  Filed Vitals:   05/02/14 0400 05/02/14 0500 05/02/14 0600 05/02/14 0730  BP: 102/57 119/51 106/29 110/41  Pulse: 50 52 57   Temp: 98.1 F (36.7 C)   98.1 F (36.7 C)  TempSrc: Oral   Oral  Resp: 17 17 12 18   Height:      Weight:  182 lb 8.7 oz (82.8 kg)    SpO2: 96% 96% 97% 97%    Intake/Output from previous day:  Intake/Output Summary (Last 24 hours) at 05/02/14 2952 Last data filed at 05/02/14 8413  Gross per 24 hour  Intake 2188.17 ml  Output   3000 ml  Net -811.83 ml    Physical Exam: Physical exam: Well-developed well-nourished in no acute distress.  Skin is warm and dry.  HEENT is normal.  Neck is supple.  Chest is clear to auscultation with normal expansion.  Cardiovascular exam is regular rate and rhythm.  Abdominal exam nontender or distended. No masses palpated. Extremities show no edema. neuro grossly intact    Lab Results: Basic Metabolic Panel:  Recent Labs  05/01/14 0230 05/02/14 0239  NA 143 142  K 4.2 3.5*  CL 109 103  CO2 23 24  GLUCOSE 81 103*  BUN 10 9  CREATININE 0.57 0.48*  CALCIUM 8.1* 8.4   CBC:  Recent Labs  05/01/14 0230 05/02/14 0239  WBC 5.7 6.8  NEUTROABS 3.0  --   HGB 11.8* 12.8  HCT 36.5 38.8  MCV 97.3 96.3  PLT 177 207   Cardiac Enzymes:  Recent Labs  05/01/14 0230  TROPONINI <0.30     Assessment/Plan:  1 paroxysmal atrial fibrillation/flutter-I have reviewed the patient's rhythm strips from [facility]. The patient appears to have atypical flutter. She also was noted to have post termination pauses following cardizem. However she had a syncopal episode at home prior to receiving any medications. Question if this was due to post termination pulse. I will have electrophysiology review on Monday. Question if she would be a candidate for ablation or need for pacemaker. She has not had any further  atrial arrhythmias in the past 24 hours. We cannot add AV nodal blocking agents given posttermination pauses and hypotension. Continue aspirin. Her embolic risk factors include female sex only. 2 syncope-as described above. 3 hypotension-etiology is unclear. She is not febrile and there is no indication of infection. Ddimer normal. Her hemoglobin is normal. Her LV function is normal. She does not appear to be dehydrated. Plan to continue hydration and wean dopamine as tolerated. CT shows no aortic mass.  Kirk Ruths 05/02/2014, 8:08 AM

## 2014-05-03 DIAGNOSIS — I495 Sick sinus syndrome: Principal | ICD-10-CM

## 2014-05-03 LAB — URINALYSIS, ROUTINE W REFLEX MICROSCOPIC
Bilirubin Urine: NEGATIVE
Glucose, UA: NEGATIVE mg/dL
HGB URINE DIPSTICK: NEGATIVE
Ketones, ur: NEGATIVE mg/dL
Leukocytes, UA: NEGATIVE
Nitrite: NEGATIVE
Protein, ur: NEGATIVE mg/dL
Specific Gravity, Urine: 1.019 (ref 1.005–1.030)
Urobilinogen, UA: 0.2 mg/dL (ref 0.0–1.0)
pH: 5.5 (ref 5.0–8.0)

## 2014-05-03 MED ORDER — YOU HAVE A PACEMAKER BOOK
Freq: Once | Status: AC
  Administered 2014-05-03: 1
  Filled 2014-05-03: qty 1

## 2014-05-03 NOTE — Progress Notes (Addendum)
    Subjective:  Denies CP or dyspnea; no fever, dysuria, chills, productive cough, melena; complains of HA (has these at home).   Objective:  Filed Vitals:   05/03/14 0500 05/03/14 0521 05/03/14 0600 05/03/14 0700  BP:  120/41 92/35 123/44  Pulse: 44 51 47 46  Temp:      TempSrc:      Resp:      Height:      Weight:      SpO2: 94% 97% 95% 97%    Intake/Output from previous day:  Intake/Output Summary (Last 24 hours) at 05/03/14 0737 Last data filed at 05/03/14 0700  Gross per 24 hour  Intake 3729.8 ml  Output   2050 ml  Net 1679.8 ml    Physical Exam: Physical exam: Well-developed well-nourished in no acute distress.  Skin is warm and dry.  HEENT is normal.  Neck is supple.  Chest is clear to auscultation with normal expansion.  Cardiovascular exam is regular rate and rhythm.  Abdominal exam nontender or distended. No masses palpated. Extremities show no edema. neuro grossly intact    Lab Results: Basic Metabolic Panel:  Recent Labs  05/01/14 0230 05/02/14 0239  NA 143 142  K 4.2 3.5*  CL 109 103  CO2 23 24  GLUCOSE 81 103*  BUN 10 9  CREATININE 0.57 0.48*  CALCIUM 8.1* 8.4   CBC:  Recent Labs  05/01/14 0230 05/02/14 0239  WBC 5.7 6.8  NEUTROABS 3.0  --   HGB 11.8* 12.8  HCT 36.5 38.8  MCV 97.3 96.3  PLT 177 207   Cardiac Enzymes:  Recent Labs  05/01/14 0230 05/02/14 0955  TROPONINI <0.30 <0.30    Telemetry-transient junctional rhythm early AM with HR high 30s and low 40s.  Assessment/Plan:  1 paroxysmal atrial fibrillation/flutter-I have reviewed the patient's rhythm strips from [facility]. The patient appears to have atypical flutter. She also was noted to have post termination pauses following cardizem. However she had a syncopal episode at home prior to receiving any medications. Question if this was due to post termination pause. I reviewed strips with Dr [name] yesterday and EP will see in AM. Question if she would be a  candidate for ablation; if not, will need pacemaker and then AV nodal blocking agents. She has not had any further atrial arrhythmias in the past 24 hours. We cannot add AV nodal blocking agents at this point given posttermination pauses and intermittent hypotension. Continue aspirin. Her embolic risk factors include female sex only. 2 syncope-as described above. 3 hypotension-patient continues with mild hypotension predominantly while asleep; etiology is unclear. She is not febrile and there is no indication of infection. Check UA. Ddimer normal. Her hemoglobin is normal. Her LV function is normal. She does not appear to be dehydrated. Plan to continue hydration and wean dopamine as tolerated. CT shows no aortic mass.  Kirk Ruths 05/03/2014, 7:37 AM

## 2014-05-04 ENCOUNTER — Encounter (HOSPITAL_COMMUNITY)
Admission: AD | Disposition: A | Discharge status: Home / Self Care | Payer: Self-pay | Source: Other Acute Inpatient Hospital | Attending: Cardiology

## 2014-05-04 ENCOUNTER — Encounter (HOSPITAL_COMMUNITY): Payer: Self-pay | Admitting: *Deleted

## 2014-05-04 DIAGNOSIS — I495 Sick sinus syndrome: Secondary | ICD-10-CM

## 2014-05-04 DIAGNOSIS — R55 Syncope and collapse: Secondary | ICD-10-CM

## 2014-05-04 DIAGNOSIS — I959 Hypotension, unspecified: Secondary | ICD-10-CM

## 2014-05-04 HISTORY — PX: PACEMAKER INSERTION: SHX728

## 2014-05-04 HISTORY — PX: PERMANENT PACEMAKER INSERTION: SHX5480

## 2014-05-04 SURGERY — PERMANENT PACEMAKER INSERTION
Anesthesia: LOCAL

## 2014-05-04 MED ORDER — ACETAMINOPHEN 325 MG PO TABS
325.0000 mg | ORAL_TABLET | ORAL | Status: DC | PRN
  Administered 2014-05-04 – 2014-05-05 (×4): 650 mg via ORAL
  Filled 2014-05-04 (×4): qty 2

## 2014-05-04 MED ORDER — SODIUM CHLORIDE 0.9 % IV SOLN
INTRAVENOUS | Status: DC
  Administered 2014-05-04 (×2): via INTRAVENOUS

## 2014-05-04 MED ORDER — LIDOCAINE HCL (PF) 1 % IJ SOLN
INTRAMUSCULAR | Status: AC
  Filled 2014-05-04: qty 30

## 2014-05-04 MED ORDER — HEPARIN (PORCINE) IN NACL 2-0.9 UNIT/ML-% IJ SOLN
INTRAMUSCULAR | Status: AC
  Filled 2014-05-04: qty 500

## 2014-05-04 MED ORDER — SODIUM CHLORIDE 0.9 % IV SOLN
INTRAVENOUS | Status: DC
Start: 2014-05-04 — End: 2014-05-04
  Administered 2014-05-04: 09:00:00 via INTRAVENOUS

## 2014-05-04 MED ORDER — MIDAZOLAM HCL 5 MG/5ML IJ SOLN
INTRAMUSCULAR | Status: AC
  Filled 2014-05-04: qty 5

## 2014-05-04 MED ORDER — CHLORHEXIDINE GLUCONATE 4 % EX LIQD: 60.0000 mL | Freq: Once | CUTANEOUS | Status: AC

## 2014-05-04 MED ORDER — CHLORHEXIDINE GLUCONATE 4 % EX LIQD
CUTANEOUS | Status: AC
  Administered 2014-05-04: 08:00:00
  Filled 2014-05-04: qty 30

## 2014-05-04 MED ORDER — GENTAMICIN SULFATE 40 MG/ML IJ SOLN
80.0000 mg | INTRAMUSCULAR | Status: DC
  Filled 2014-05-04: qty 2

## 2014-05-04 MED ORDER — ONDANSETRON HCL 4 MG/2ML IJ SOLN: 4.0000 mg | Freq: Four times a day (QID) | INTRAMUSCULAR | Status: DC | PRN

## 2014-05-04 MED ORDER — SODIUM CHLORIDE 0.9 % IV SOLN
INTRAVENOUS | Status: AC
  Administered 2014-05-04: 12:00:00 via INTRAVENOUS

## 2014-05-04 MED ORDER — FENTANYL CITRATE 0.05 MG/ML IJ SOLN
INTRAMUSCULAR | Status: AC
  Filled 2014-05-04: qty 2

## 2014-05-04 MED ORDER — CEFAZOLIN SODIUM 1-5 GM-% IV SOLN
1.0000 g | Freq: Four times a day (QID) | INTRAVENOUS | Status: AC
  Administered 2014-05-04 – 2014-05-05 (×3): 1 g via INTRAVENOUS
  Filled 2014-05-04 (×3): qty 50

## 2014-05-04 MED ORDER — CEFAZOLIN SODIUM-DEXTROSE 2-3 GM-% IV SOLR
2.0000 g | INTRAVENOUS | Status: DC
  Filled 2014-05-04: qty 50

## 2014-05-04 NOTE — CV Procedure (Signed)
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 Hemostasis was obtained.  After this, we turned our attention to gaining accessm to the extrathoracic,left subclavian vein. This was accomplished without difficulty and without the aspiration of air or puncture of the artery. 2 separate venipunctures were accomplished; guidewires were placed and retained and sequentially 7 French sheath through which were  passed an Medtronic MRI compatible  ventricular lead serial [id number] and a Medtronic MRI compatible   atrial lead serial number [id number] .  The ventricular lead was manipulated to the right ventricular apex with a bipolar R wave was 8, the pacing impedance was 1246, the threshold was 1.3 @ 0.5 msec  Current at threshold was   10  Ma and the current of injury was  BRISK.  The right atrial lead was manipulated to the right atrial appendage with a bipolar P-wave  3, the pacing impedance was 1081, the threshold 0.9@ 0.5 msec   Current at threshold was 0.9  Ma and the current of injury was BRISK.  The leads were affixed to the prepectoral fascia and attached to a Medtronic MRI compatible pulse generator [id number]  pulse generator serial number [id number].  Hemostasis was obtained. The pocket was copiously irrigated with antibiotic containing saline solution. The leads and the pulse generator were placed in the pocket and affixed to the prepectoral fascia. The wound was then closed in 2 layers in the normal fashion.  The wound was washed dried . And a dermabond adhesive was applied   Needle  Count, sponge counts and instrument counts were  correct at the end of the procedure .   The patient tolerated the procedure without apparent complication.  Maryagnes Amos.D.

## 2014-05-04 NOTE — Consult Note (Signed)
ELECTROPHYSIOLOGY CONSULT NOTE    Patient ID: Tiffany Melton MRN: [id number], DOB/AGE: 06-28-1951 63 y.o.  Admit date: 04/30/2014 Date of Consult: 05-04-14  Primary Physician: Rory Percy, MD Primary Cardiologist: Domenic Polite  Reason for Consultation: tachy brady syndrome  HPI:  Tiffany Melton is a 63 y.o. female with a past medical history significant for hypertension and Raynaud's disease.  She presented to [facility] on the day of admission with complaints of chest tightness and dizziness. She was found to be in afib with RVR with ventricular rates in the 170's.  She was given IV Diltiazem with improved rate control.  She then had several pauses lasting up to 4 seconds.  She was placed on temporary external pacemaker, received back up pacing, and converted to sinus bradycardia.  [facility].  Telemetry here has demonstrated sinus bradycardia and intermittent junctional rhythm.  She reports that for the last several months she has had decreased exercise intolerance and intermittent dizziness.    She has a long-standing history of recurrent tachypalpitations lasting more than a couple of years. These have been associated with lightheadedness.  She denies neurological symptoms.  Her hospitalization has also been notable for hypotension for which no explanation has been identified. This persisted and required pressors for a couple of days. Cortisol, d-dimer hemoglobin LV function was normal. No evidence of infection was noted.  Echo this admission demonstrated EF 60-65%, no RWMA, possible mass in ascending aorta, LA 50.    CTA of chest demonstrated no mass in aortic root or ascending aorta.   EP has been asked to evaluate for treatment options.   Thromboembolic risk profile is notable for a CHADS-VASc score of 0 (1 gender)  Past Medical History  Diagnosis Date  . Essential hypertension, benign   . Raynaud's disease   . Abnormal stress test      GXT 9/13  . Atrial fibrillation      Surgical History:  Past Surgical History  Procedure Laterality Date  . Back surgery    . Tonsillectomy    . Cholecystectomy    . Cardiac catheterization      2 years ago     Prescriptions prior to admission  Medication Sig Dispense Refill  . nitroGLYCERIN (NITROSTAT) 0.4 MG SL tablet Place 0.4 mg under the tongue every 5 (five) minutes as needed for chest pain.         Inpatient Medications:  . aspirin EC  81 mg Oral Daily  .  ceFAZolin (ANCEF) IV  2 g Intravenous On Call  . Chlorhexidine Gluconate Cloth  6 each Topical Q0600  . gentamicin irrigation  80 mg Irrigation On Call  . mupirocin ointment  1 application Nasal BID    Allergies: No Known Allergies  History   Social History  . Marital Status: Married    Spouse Name: N/A    Number of Children: N/A  . Years of Education: N/A   Occupational History  . Not on file.   Social History Main Topics  . Smoking status: Former Smoker -- 1.00 packs/day for 35 years    Types: Cigarettes    Start date: 10/09/1962    Quit date: 08/09/1982  . Smokeless tobacco: Never Used  . Alcohol Use: No  . Drug Use: No  . Sexual Activity: Not on file   Other Topics Concern  . Not on file   Social History Narrative  . No narrative on file  Family History  Problem Relation Age of Onset  . Hypertension      BP 137/63  Pulse 52  Temp(Src) 97.2 F (36.2 C) (Oral)  Resp 20  Ht 5\' 8"  (1.727 m)  Wt 187 lb 13.3 oz (85.2 kg)  BMI 28.57 kg/m2  SpO2 100%  Physical Exam: Alert and oriented in no acute distress HENT- normal Eyes- EOMI, without scleral icterus Skin- warm and dry; without rashes LN-neg Neck- supple without thyromegaly, JVP-flat, carotids brisk and full without bruits Back-without CVAT or kyphosis Lungs-clear to auscultation CV-Regular rate and rhythm, nl S1 and S2, no murmurs gallops or rubs, S4-absent Abd-soft with active bowel sounds; no midline pulsation or  hepatomegaly Pulses-intact femoral and distal MKS-without gross deformity Neuro- Ax O, CN3-12 intact, grossly normal motor and sensory function Affect engaging   Labs:   Lab Results  Component Value Date   WBC 6.8 05/02/2014   HGB 12.8 05/02/2014   HCT 38.8 05/02/2014   MCV 96.3 05/02/2014   PLT 207 05/02/2014     Recent Labs Lab 05/02/14 0239  NA 142  K 3.5*  CL 103  CO2 24  BUN 9  CREATININE 0.48*  CALCIUM 8.4  PROT 6.6  BILITOT 0.4  ALKPHOS 91  ALT 15  AST 21  GLUCOSE 103*     Radiology/Studies: Ct Coronary Morp W/cta Cor W/score W/ca W/cm &/or Wo/cm 05/01/2014   CLINICAL DATA:  Possible aortic root mass noted on recent echocardiogram.  EXAM: CT ANGIOGRAPHY OF THE HEART, CORONARY ARTERY, STRUCTURE, AND MORPHOLOGY  PROCEDURE: CT angiography of the coronary vessels was performed on a 256 channel system using prospective ECG gating. A scout and ECG-gated noncontrast exam (for calcium scoring) were performed. Appropriate delay was determined by bolus tracking after injection of iodinated contrast, and an ECG-gated coronary CTA was performed with sub-mm slice collimation during late diastole. Imaging post processing was performed on an independent workstation creating multiplanar and 3-D images, allowing for quantitative analysis of the heart and coronary arteries. Note that this exam targets the heart and the chest was not imaged in its entirety.  COMPARISON:  None.  MEDICATIONS: Lopressor:  5  mg, IV  Nitroglycerin 400 mcg, sublingual  FINDINGS: Technical quality: Suboptimal. There was a malfunction with the scanner such that the scanner was decoupled from the injector and bolus timing was significantly off (images were acquired during the venous phase rather than arterial phase of acquisition).  Heart rate:  60-62  CORONARY ARTERIES:  Evaluation of coronary artery is was nondiagnostic secondary to the limitations of the examination.  CORONARY CALCIUM: Total Agatston Score:  52  MESA  database percentile:  93rd  AORTA AND PULMONARY MEASUREMENTS:  Aortic root (21 - 40 mm):  23  mm at the annulus  31  mm at the sinuses of Valsalva  27  mm at the sinotubular junction  Ascending aorta: (< 40 mm):  35 mm  Descending aorta:  (< 40 mm):  28 mm  Main pulmonary artery: (< 30 mm):  29 mm  OTHER FINDINGS: The aortic valve is mildly calcified, predominantly on the left cusp near the commissure with the right cusp and near the commissure with the noncoronary cusp. Small amount of scarring in the inferior segment of the lingula. Dependent subsegmental atelectasis in the lower lobes of the lungs bilaterally. No suspicious appearing pulmonary nodules or masses are identified within the visualized thorax. Visualized portions of the upper abdomen are unremarkable. Old compression fracture of a mid thoracic vertebral  body (likely T9) with approximately 15% loss of anterior vertebral body height.  IMPRESSION: 1. Despite the limitations of today's examination, there is no aortic root or ascending aorta mass identified. 2. Mild sclerosis of the aortic valve. 3. The patient's total coronary artery calcium score is 71 which is 93rd percentile for patient's of matched age, gender and race/ethnicity. 4. Additional incidental findings, as above. These results were called by telephone at the time of interpretation on 05/01/2014 at 7:45 pm to Dr. Candee Furbish , who verbally acknowledged these results.   Electronically Signed   By: Vinnie Langton M.D.   On: 05/01/2014 19:52    TKZ:SWFUX brady, rate 57, normal intervals  TELEMETRY: atrial fibrillation with post termination pauses >4 sec  .hpor Principal Problem:   Syncope Active Problems:   Sinus pause   Atrial fibrillation with RVR   Sinus node dysfunction   Chest pain  The patient has had recurrent atrial fibrillation with tachycardia palpitations, episodes of lightheadedness and most recently syncope. She has had in the absence of AV node blocking agents  posttermination pauses. She's also had rapid rates require and swelling. Given this combination of sinus node dysfunction rapid atrial fibrillation and syncope with documented posttermination pauses, I think dual-chamber pacing as the Foundational  therapy is indicated.  control of rate and rhythm to require backup bradycardia pacing. I anticipate the use of antiarrhythmic therapy given the issues of hypotension an explanation for which thus far has alluded [place]

## 2014-05-05 ENCOUNTER — Inpatient Hospital Stay (HOSPITAL_COMMUNITY): Payer: BC Managed Care – PPO

## 2014-05-05 ENCOUNTER — Encounter (HOSPITAL_COMMUNITY): Payer: Self-pay | Admitting: *Deleted

## 2014-05-05 MED ORDER — ASPIRIN 81 MG PO TBEC: 81.0000 mg | DELAYED_RELEASE_TABLET | Freq: Every day | ORAL | Status: DC

## 2014-05-05 NOTE — Progress Notes (Signed)
   ELECTROPHYSIOLOGY ROUNDING NOTE    Patient Name: Tiffany Melton Date of Encounter: 05/05/2014    SUBJECTIVE:Patient feels well this morning.  No chest pain or shortness of breath.   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    TELEMETRY: Reviewed telemetry pt in atrial pacing with intrinsic ventricular conduction: Filed Vitals:   05/04/14 1324 05/04/14 1348 05/04/14 1531 05/05/14 0514  BP: 132/76 120/87 104/47 112/76  Pulse: 60 67 66 62  Temp:    97.6 F (36.4 C)  TempSrc:    Oral  Resp:  18  20  Height:      Weight:      SpO2: 100% 100% 100% 100%    Intake/Output Summary (Last 24 hours) at 05/05/14 9407 Last data filed at 05/04/14 1000  Gross per 24 hour  Intake 336.25 ml  Output      0 ml  Net 336.25 ml    CURRENT MEDICATIONS: . aspirin EC  81 mg Oral Daily  . mupirocin ointment  1 application Nasal BID    Radiology/Studies:  Final result pending, leads in stable position.  PHYSICAL EXAM Well developed and nourished in no acute distress HENT normal Neck supple Wound without hematoma Clear Regular rate and rhythm, no murmurs or gallops Abd-soft with active BS No Clubbing cyanosis edema Skin-warm and dry A & Oriented  Grossly normal sensory and motor function   DEVICE INTERROGATION: Device interrogated by industry.  Lead values including impedence, sensing, threshold within normal values.    Principal Problem:   Syncope Active Problems:   Atrial fibrillation with RVR   Sinus node dysfunction   Sinus pause   Chest pain   Hypotension   Wound care, restrictions reviewed with patient.  Will schedule follow up in [place] office per her request.  For afib, willlet symptoms and frequency declare the course

## 2014-05-05 NOTE — Discharge Instructions (Signed)
° ° °  Supplemental Discharge Instructions for  Pacemaker Patients  Activity No heavy lifting or vigorous activity with your left/right arm for 6 to 8 weeks.  Do not raise your left/right arm above your head for one week.  Gradually raise your affected arm as drawn below.              05/07/14                        05/08/14                         05/09/14                   05/10/14  NO DRIVING until cleared by your cardiologist.  WOUND CARE   Keep the wound area clean and dry.  Do not get this area wet for one week. No showers for one week; you may shower on  05/12/14  .   The tape/steri-strips on your wound will fall off; do not pull them off.  No bandage is needed on the site.  DO  NOT apply any creams, oils, or ointments to the wound area.   If you notice any drainage or discharge from the wound, any swelling or bruising at the site, or you develop a fever > 101? F after you are discharged home, call the office at once.  Special Instructions   You are still able to use cellular telephones; use the ear opposite the side where you have your pacemaker/defibrillator.  Avoid carrying your cellular phone near your device.   When traveling through airports, show security personnel your identification card to avoid being screened in the metal detectors.  Ask the security personnel to use the [place] wand.   Avoid arc welding equipment, MRI testing (magnetic resonance imaging), TENS units (transcutaneous nerve stimulators).  Call the office for questions about other devices.   Avoid electrical appliances that are in poor condition or are not properly grounded.   Microwave ovens are safe to be near or to operate.

## 2014-05-06 NOTE — Progress Notes (Signed)
Patient discharged to home.  Patient alert, oriented, verbally responsive, breathing regular and non-labored throughout, no s/s of distress noted throughout, no c/o pain throughout.  Discharge instructions thoroughly verbalized to patient and husband at bedside.  Both verbalized understanding throughout.  Patient left unit per wheelchair accompanied by tech.  VS WNL.  Dirk Dress 05/06/2014

## 2014-05-18 ENCOUNTER — Telehealth: Payer: Self-pay | Admitting: Internal Medicine

## 2014-05-18 NOTE — Telephone Encounter (Signed)
Mrs. Komorowski wants to know if she can return to work before seeing the clinic on 05-29-14 (wound check)

## 2014-05-18 NOTE — Telephone Encounter (Signed)
Patient informed that she will not be able to go to work until after her wound check appt. Pt voiced understanding. I offered her a sooner appt than the one scheduled for [facility] She stated that she would think about it and call back if she could make it. She says that she has not had any problems with the wound since the placement of the device.

## 2014-05-21 ENCOUNTER — Telehealth: Payer: Self-pay | Admitting: Internal Medicine

## 2014-05-21 ENCOUNTER — Ambulatory Visit (INDEPENDENT_AMBULATORY_CARE_PROVIDER_SITE_OTHER): Payer: BC Managed Care – PPO | Admitting: *Deleted

## 2014-05-21 DIAGNOSIS — Z950 Presence of cardiac pacemaker: Secondary | ICD-10-CM

## 2014-05-21 DIAGNOSIS — I495 Sick sinus syndrome: Secondary | ICD-10-CM

## 2014-05-21 NOTE — Telephone Encounter (Signed)
Reviewed restrictions for lifting > 10 pounds with the affected arm and she is clear to return to work.

## 2014-05-21 NOTE — Telephone Encounter (Signed)
New message     Pt was just seen and forgot to ask if she can go back to work.

## 2014-05-21 NOTE — Discharge Summary (Signed)
    Patient ID: Tiffany Melton MRN: [id number] DOB/AGE: [birth year] 63 y.o.  Admit date: 04/30/2014 Discharge date: 05/21/2014  Primary Discharge Diagnosis: Syncope Secondary Discharge Diagnosis: Hypotension, AFIB RVR    Consults: Dr. Avis Epley Course: 63 year old with AFIB RVR, associated 5 second pauses, hypotension, syncope. Workup for infection, Addisons negative.   CT scan of aorta was normal. (ECHO artifact aorta mass), normal EF.   Pacemaker placed, Dr. Caryl Comes.  Tiffany Melton discharged her after Dr. Caryl Comes saw in AM. See his notes for details.     Discharge Exam: Blood pressure 112/76, pulse 62, temperature 97.6 F (36.4 C), temperature source Oral, resp. rate 20, height 5\' 8"  (1.727 m), weight 187 lb 13.3 oz (85.2 kg), SpO2 100.00%.  Labs:   Lab Results  Component Value Date   WBC 6.8 05/02/2014   HGB 12.8 05/02/2014   HCT 38.8 05/02/2014   MCV 96.3 05/02/2014   PLT 207 05/02/2014   No results found for this basename: NA, K, CL, CO2, BUN, CREATININE, CALCIUM, LABALBU, PROT, BILITOT, ALKPHOS, ALT, AST, GLUCOSE,  in the last 168 hours Lab Results  Component Value Date   TROPONINI <0.30 05/02/2014    Lab Results  Component Value Date   CHOL 195 05/01/2014   Lab Results  Component Value Date   HDL 55 05/01/2014   Lab Results  Component Value Date   LDLCALC 126* 05/01/2014   Lab Results  Component Value Date   TRIG 69 05/01/2014   Lab Results  Component Value Date   CHOLHDL 3.5 05/01/2014       FOLLOW UP PLANS AND APPOINTMENTS    Medication List    STOP taking these medications       nitroGLYCERIN 0.4 MG SL tablet  Commonly known as:  NITROSTAT      TAKE these medications       aspirin 81 MG EC tablet  Take 1 tablet (81 mg total) by mouth daily.           Follow-up Information   Follow up with [facility]. (05/22/14 at 11:30am for pacemaker wound check)    Specialty:  Cardiology   Contact information:   [place] [address] [phone]      Follow up with Thompson Grayer, MD. (09/07/14 at 9am)    Contact information:   [place] [address] [phone]      [facility] UP APPOINTMENTS   Signed: Candee Furbish 05/21/2014, 11:52 AM

## 2014-05-22 ENCOUNTER — Ambulatory Visit: Payer: BC Managed Care – PPO

## 2014-05-22 LAB — MDC_IDC_ENUM_SESS_TYPE_INCLINIC
Battery Remaining Longevity: 142 mo
Battery Voltage: 3.11 V
Brady Statistic AS VS Percent: 27.23 %
Brady Statistic RA Percent Paced: 72.5 %
Brady Statistic RV Percent Paced: 3.46 %
Date Time Interrogation Session: 20150813181714
Lead Channel Impedance Value: 418 Ohm
Lead Channel Impedance Value: 475 Ohm
Lead Channel Pacing Threshold Amplitude: 0.5 V
Lead Channel Pacing Threshold Pulse Width: 0.4 ms
Lead Channel Sensing Intrinsic Amplitude: 3.875 mV
Lead Channel Sensing Intrinsic Amplitude: 4.125 mV
Lead Channel Setting Pacing Amplitude: 3.5 V
Lead Channel Setting Pacing Pulse Width: 0.6 ms
MDC IDC MSMT LEADCHNL RA IMPEDANCE VALUE: 570 Ohm
MDC IDC MSMT LEADCHNL RV IMPEDANCE VALUE: 361 Ohm
MDC IDC MSMT LEADCHNL RV PACING THRESHOLD AMPLITUDE: 1.875 V
MDC IDC MSMT LEADCHNL RV PACING THRESHOLD PULSEWIDTH: 0.4 ms
MDC IDC MSMT LEADCHNL RV SENSING INTR AMPL: 2.25 mV
MDC IDC MSMT LEADCHNL RV SENSING INTR AMPL: 4.5 mV
MDC IDC SET LEADCHNL RA PACING AMPLITUDE: 3.5 V
MDC IDC SET LEADCHNL RV SENSING SENSITIVITY: 2 mV
MDC IDC STAT BRADY AP VP PERCENT: 3.19 %
MDC IDC STAT BRADY AP VS PERCENT: 69.31 %
MDC IDC STAT BRADY AS VP PERCENT: 0.26 %
Zone Setting Detection Interval: 350 ms
Zone Setting Detection Interval: 400 ms

## 2014-05-22 NOTE — Progress Notes (Signed)
Wound check appointment. No steri-strips, dermabond used. Wound without redness or edema. Incision edges approximated, wound well healed. Normal device function. Thresholds, sensing, and impedances consistent with implant measurements. Device programmed at 3.5V/auto capture programmed on for extra safety margin until 3 month visit---changed RV output from 4.75V@0 .62ms to 3.5V@0 .89ms. Histogram distribution appropriate for patient and level of activity. No mode switches or high ventricular rates noted. Patient educated about wound care, arm mobility, lifting restrictions. ROV w/ Dr. Liane Comber 09/07/14.

## 2014-05-29 ENCOUNTER — Ambulatory Visit: Payer: BC Managed Care – PPO

## 2014-06-03 ENCOUNTER — Encounter: Payer: Self-pay | Admitting: Internal Medicine

## 2014-08-11 ENCOUNTER — Telehealth: Payer: Self-pay | Admitting: *Deleted

## 2014-08-11 NOTE — Telephone Encounter (Signed)
Patient called to see if it was safe for her to tan 2-3 x's per week with her PPM. Please advise.

## 2014-08-20 NOTE — Telephone Encounter (Signed)
LMOM--1613/[id number]

## 2014-09-07 ENCOUNTER — Encounter: Payer: Self-pay | Admitting: Internal Medicine

## 2014-09-07 ENCOUNTER — Ambulatory Visit (INDEPENDENT_AMBULATORY_CARE_PROVIDER_SITE_OTHER): Payer: BC Managed Care – PPO | Admitting: Internal Medicine

## 2014-09-07 VITALS — BP 128/83 | HR 79 | Ht 69.0 in | Wt 192.0 lb

## 2014-09-07 DIAGNOSIS — I4891 Unspecified atrial fibrillation: Secondary | ICD-10-CM

## 2014-09-07 DIAGNOSIS — I495 Sick sinus syndrome: Secondary | ICD-10-CM | POA: Diagnosis not present

## 2014-09-07 DIAGNOSIS — I480 Paroxysmal atrial fibrillation: Secondary | ICD-10-CM

## 2014-09-07 LAB — MDC_IDC_ENUM_SESS_TYPE_INCLINIC
Battery Remaining Longevity: 134 mo
Brady Statistic AP VP Percent: 2.18 %
Brady Statistic AP VS Percent: 60.01 %
Brady Statistic AS VS Percent: 37.4 %
Brady Statistic RV Percent Paced: 2.59 %
Date Time Interrogation Session: 20151130092429
Lead Channel Impedance Value: 399 Ohm
Lead Channel Pacing Threshold Amplitude: 0.5 V
Lead Channel Pacing Threshold Pulse Width: 0.4 ms
Lead Channel Pacing Threshold Pulse Width: 0.4 ms
Lead Channel Sensing Intrinsic Amplitude: 5 mV
Lead Channel Setting Sensing Sensitivity: 2 mV
MDC IDC MSMT BATTERY VOLTAGE: 3.05 V
MDC IDC MSMT LEADCHNL RA IMPEDANCE VALUE: 532 Ohm
MDC IDC MSMT LEADCHNL RA IMPEDANCE VALUE: 665 Ohm
MDC IDC MSMT LEADCHNL RA SENSING INTR AMPL: 4.125 mV
MDC IDC MSMT LEADCHNL RV IMPEDANCE VALUE: 437 Ohm
MDC IDC MSMT LEADCHNL RV PACING THRESHOLD AMPLITUDE: 1 V
MDC IDC SET LEADCHNL RA PACING AMPLITUDE: 2 V
MDC IDC SET LEADCHNL RV PACING AMPLITUDE: 2.5 V
MDC IDC SET LEADCHNL RV PACING PULSEWIDTH: 0.4 ms
MDC IDC STAT BRADY AS VP PERCENT: 0.41 %
MDC IDC STAT BRADY RA PERCENT PACED: 62.19 %
Zone Setting Detection Interval: 350 ms
Zone Setting Detection Interval: 400 ms

## 2014-09-07 MED ORDER — DILTIAZEM HCL 30 MG PO TABS: 30.0000 mg | ORAL_TABLET | Freq: Four times a day (QID) | ORAL | Status: DC | PRN

## 2014-09-07 NOTE — Patient Instructions (Addendum)
Your physician recommends that you schedule a follow-up appointment in: 1 year with Dr. [name]. You will receive a reminder letter in the mail in about 10 months reminding you to call and schedule your appointment. If you don't receive this letter, please contact our office. Carelink device check 12/07/14. Your physician has recommended you make the following change in your medication Start diltiazem 30 mg taking 1-2 tablets every 6 hours as needed for palipitations. Please schedule a visit with Dr. Domenic Polite in 3-4 months.

## 2014-09-07 NOTE — Progress Notes (Signed)
PCP:  Rory Percy, MD Primary Cardiologist: Dr Domenic Polite  The patient presents today for routine electrophysiology followup.  Since her recent hospital discharge, the patient reports doing very well.  Her afib burden is low.  She reports symptoms of tachypalpitations and fatigue during her afib.  Episodes are short lived. Today, she denies symptoms of chest pain, shortness of breath, orthopnea, PND, lower extremity edema, dizziness, presyncope, syncope, or neurologic sequela.  The patient feels that she is tolerating medications without difficulties and is otherwise without complaint today.   Past Medical History  Diagnosis Date  . Raynaud's disease   . Abnormal stress test     GXT 9/13, cath revealed normal coronary arteries  . Paroxysmal atrial fibrillation   . Sick sinus syndrome    Past Surgical History  Procedure Laterality Date  . Back surgery    . Tonsillectomy    . Cholecystectomy    . Cardiac catheterization  2013    normal cors  . Pacemaker insertion  05-04-14    MDT Advisa dual chamber pacemaker implanted by Dr Caryl Comes for SSS, atrial fibrillation post termination pauses    Current Outpatient Prescriptions  Medication Sig Dispense Refill  . aspirin EC 81 MG EC tablet Take 1 tablet (81 mg total) by mouth daily.    Marland Kitchen diltiazem (CARDIZEM) 30 MG tablet Take 1-2 tablets (30-60 mg total) by mouth every 6 (six) hours as needed. palpitations 30 tablet 1   No current facility-administered medications for this visit.    No Known Allergies  History   Social History  . Marital Status: Married    Spouse Name: N/A    Number of Children: N/A  . Years of Education: N/A   Occupational History  . Not on file.   Social History Main Topics  . Smoking status: Former Smoker -- 1.00 packs/day for 35 years    Types: Cigarettes    Start date: 10/09/1962    Quit date: 08/09/1982  . Smokeless tobacco: Never Used  . Alcohol Use: No  . Drug Use: No  . Sexual Activity: Not on file    Other Topics Concern  . Not on file   Social History Narrative    Family History  Problem Relation Age of Onset  . Hypertension      ROS-  All systems are reviewed and are negative except as outlined in the HPI above  Physical Exam: Filed Vitals:   09/07/14 0848  BP: 128/83  Pulse: 79  Height: 5\' 9"  (1.753 m)  Weight: 192 lb (87.091 kg)    GEN- The patient is well appearing, alert and oriented x 3 today.   Head- normocephalic, atraumatic Eyes-  Sclera clear, conjunctiva pink Ears- hearing intact Oropharynx- clear Neck- supple, no JVP Lymph- no cervical lymphadenopathy Lungs- Clear to ausculation bilaterally, normal work of breathing Heart- Regular rate and rhythm, no murmurs, rubs or gallops, PMI not laterally displaced GI- soft, NT, ND, + BS Extremities- no clubbing, cyanosis, or edema Neuro- strength and sensation are intact Pacemaker pocket is well healed  Pacemaker interrogation is reviewed today and normal  Assessment and Plan:  1. afib Burden is low (0.5% by PPM interrogation today) Today I have given cardizem 30mg  tablets which she can take prn with afib She does not wish to take daily antiarrhythmic therapy at this time.  We could consider flecainide as a pill in the pocket if needed going forward. Her chads2vasc score is 1 though she will soon be a 63 yo.  She is aware that once she is 64, chads2vasc score would be 2 and she should consider initiation of anticoagulation therapy at that time.  She is not overly excited about this.  2. Sick sinus with post termination pauses She a paces > 65% of the time but is doing well with this Normal pacemaker function See Claudia Desanctis Art report No changes today  3. Overweight Regular exercise is encouraged  She will follow-up with Dr Domenic Polite for AF management carelink every 3 months I will see again in 1 year

## 2014-09-08 ENCOUNTER — Encounter: Payer: Self-pay | Admitting: Internal Medicine

## 2014-09-17 ENCOUNTER — Encounter (HOSPITAL_COMMUNITY): Payer: Self-pay | Admitting: Internal Medicine

## 2014-11-10 ENCOUNTER — Telehealth: Payer: Self-pay | Admitting: *Deleted

## 2014-11-12 ENCOUNTER — Telehealth: Payer: Self-pay | Admitting: Internal Medicine

## 2014-11-12 NOTE — Telephone Encounter (Signed)
Informed pt that wearing a fitbit is ok and using a Yeske gun is ok as well.

## 2014-11-12 NOTE — Telephone Encounter (Signed)
°  1. Has your device fired? No  2. Is you device beeping? No  3. Are you experiencing draining or swelling at device site? No  4. Are you calling to see if we received your device transmission? No  5. Have you passed out? No  Pt calling wanting to know if her wearing a Fitbit will interfere with her Pacemaker? Pt also states that she has signed up for a concealed carry gun class and wants to know if her shooting a gun will interfere with her pacemaker as well. Please call back and advice. Pt states that she has to work at 1:30 and if she is not home when you call, pt states that it is ok to leave a detailed message answering her questions.

## 2014-11-20 NOTE — Telephone Encounter (Signed)
Patient informed via vm. 

## 2014-11-30 ENCOUNTER — Ambulatory Visit (INDEPENDENT_AMBULATORY_CARE_PROVIDER_SITE_OTHER): Payer: BC Managed Care – PPO | Admitting: Cardiology

## 2014-11-30 ENCOUNTER — Encounter: Payer: Self-pay | Admitting: Cardiology

## 2014-11-30 VITALS — BP 138/94 | HR 80 | Ht 68.0 in | Wt 185.0 lb

## 2014-11-30 DIAGNOSIS — I495 Sick sinus syndrome: Secondary | ICD-10-CM

## 2014-11-30 DIAGNOSIS — I480 Paroxysmal atrial fibrillation: Secondary | ICD-10-CM

## 2014-11-30 NOTE — Patient Instructions (Signed)
Continue all current medications. Your physician wants you to follow up in: 6 months.  You will receive a reminder letter in the mail one-two months in advance.  If you don't receive a letter, please call our office to schedule the follow up appointment   

## 2014-11-30 NOTE — Progress Notes (Signed)
Cardiology Office Note  Date: 11/30/2014   ID: Tiffany Melton, DOB Nov 05, 1950, MRN [id number]  PCP: Rory Percy, MD  Primary Cardiologist: Rozann Lesches, MD   Chief Complaint  Patient presents with  . PAF  . Sick sinus syndrome    History of Present Illness: Tiffany Melton is a 64 y.o. female last seen by me in November 2013. Interval follow-up with Dr. [name] noted in November 2015. As noted below, she has a history of interval Medtronic pacemaker placement with sick sinus syndrome in the setting of PAF, July 2015. She has been noted to have very low atrial fibrillation burden on device interrogation, so far has not been anticoagulated with CHADSVASC score of 1.  She presents today stating that she has been trying to exercise more, has a wrist worn fitness monitoring device, and has also been participating in [employer]. She still experiences intermittent palpitations, they scare her somewhat, but she does not have to use Cardizem very frequently at all. She has had no dizziness or syncope.   Past Medical History  Diagnosis Date  . Raynaud's disease   . Abnormal stress test     GXT 9/13, cath revealed normal coronary arteries  . Paroxysmal atrial fibrillation   . Sick sinus syndrome     Past Surgical History  Procedure Laterality Date  . Back surgery    . Tonsillectomy    . Cholecystectomy    . Cardiac catheterization  2013  . Pacemaker insertion  05-04-14    MDT Advisa dual chamber pacemaker implanted by Dr Caryl Comes for SSS, atrial fibrillation post termination pauses  . Permanent pacemaker insertion N/A 05/04/2014    Procedure: PERMANENT PACEMAKER INSERTION;  Surgeon: Deboraha Sprang, MD;  Location: [facility] CATH LAB;  Service: Cardiovascular;  Laterality: N/A;    Current Outpatient Prescriptions  Medication Sig Dispense Refill  . aspirin EC 81 MG EC tablet Take 1 tablet (81 mg total) by mouth daily.    Marland Kitchen diltiazem (CARDIZEM) 30 MG tablet Take 1-2 tablets (30-60 mg total)  by mouth every 6 (six) hours as needed. palpitations 30 tablet 1   No current facility-administered medications for this visit.    Allergies:  Review of patient's allergies indicates no known allergies.   Social History: The patient  reports that she quit smoking about 32 years ago. Her smoking use included Cigarettes. She started smoking about 52 years ago. She has a 35 pack-year smoking history. She has never used smokeless tobacco. She reports that she does not drink alcohol or use illicit drugs.   Family History: The patient's family history includes Hypertension in an other family member.   ROS:  Please see the history of present illness. Otherwise, complete review of systems is positive for anxiety.  All other systems are reviewed and negative.    Physical Exam: VS:  BP 138/94 mmHg  Pulse 80  Ht 5\' 8"  (1.727 m)  Wt 185 lb (83.915 kg)  BMI 28.14 kg/m2  SpO2 98%, BMI Body mass index is 28.14 kg/(m^2).  Wt Readings from Last 3 Encounters:  11/30/14 185 lb (83.915 kg)  09/07/14 192 lb (87.091 kg)  05/04/14 187 lb 13.3 oz (85.2 kg)     Patient in no acute distress.  HEENT: Conjunctiva and lids normal, oropharynx clear with moist mucosa.  Neck: Supple, no elevated JVP or carotid bruits, no thyromegaly.  Lungs: Clear to auscultation, nonlabored breathing at rest.  Cardiac: Regular rate and rhythm, no S3 or significant systolic  murmur, no pericardial rub.  Abdomen: Soft, nontender, bowel sounds present.  Extremities: No pitting edema, distal pulses 2+.  Skin: Warm and dry.  Musculoskeletal: No kyphosis.  Neuropsychiatric: Alert and oriented x3, affect grossly appropriate.   ECG: ECG is not ordered today.   Recent Labwork: 05/01/2014: Pro B Natriuretic peptide (BNP) 490.1* 05/02/2014: ALT 15; AST 21; BUN 9; Creatinine 0.48*; Hemoglobin 12.8; Platelets 207; Potassium 3.5*; Sodium 142     Component Value Date/Time   CHOL 195 05/01/2014 0230   TRIG 69 05/01/2014 0230    HDL 55 05/01/2014 0230   CHOLHDL 3.5 05/01/2014 0230   VLDL 14 05/01/2014 0230   LDLCALC 126* 05/01/2014 0230    Assessment and Plan:  1. Paroxysmal atrial fibrillation, CHADSVASC score of 1. She continues on aspirin and as needed Cardizem for now. We did discuss potential for anticoagulation as her thromboembolic risk increases. Keep interval follow with Dr. [name] for device interrogation.  2. History of sick sinus syndrome with post-termination pauses status post Medtronic pacemaker followed by Dr. [name].  3. History of normal coronary arteries by cardiac catheterization 2013.  Current medicines are reviewed at length with the patient today.  The patient does not have concerns regarding medicines.  Disposition: FU with me in 6 months.   Signed, Satira Sark, MD, [facility] 11/30/2014 9:24 AM    [place] at [place], [place],  [address] Phone: [phone]; Fax: [phone]

## 2014-12-07 ENCOUNTER — Ambulatory Visit (INDEPENDENT_AMBULATORY_CARE_PROVIDER_SITE_OTHER): Payer: BC Managed Care – PPO | Admitting: *Deleted

## 2014-12-07 DIAGNOSIS — I495 Sick sinus syndrome: Secondary | ICD-10-CM

## 2014-12-08 NOTE — Progress Notes (Signed)
Remote pacemaker transmission.   

## 2014-12-16 LAB — MDC_IDC_ENUM_SESS_TYPE_REMOTE
Brady Statistic AP VP Percent: 0.15 %
Brady Statistic AP VS Percent: 68.46 %
Brady Statistic AS VP Percent: 0.02 %
Brady Statistic AS VS Percent: 31.37 %
Brady Statistic RA Percent Paced: 68.61 %
Brady Statistic RV Percent Paced: 0.16 %
Lead Channel Impedance Value: 665 Ohm
Lead Channel Pacing Threshold Amplitude: 1 V
Lead Channel Pacing Threshold Pulse Width: 0.4 ms
Lead Channel Sensing Intrinsic Amplitude: 0.375 mV
Lead Channel Setting Pacing Amplitude: 2 V
Lead Channel Setting Pacing Amplitude: 2.5 V
Lead Channel Setting Pacing Pulse Width: 0.4 ms
Lead Channel Setting Sensing Sensitivity: 2 mV
MDC IDC MSMT BATTERY REMAINING LONGEVITY: 128 mo
MDC IDC MSMT BATTERY VOLTAGE: 3.03 V
MDC IDC MSMT LEADCHNL RA IMPEDANCE VALUE: 494 Ohm
MDC IDC MSMT LEADCHNL RA PACING THRESHOLD AMPLITUDE: 0.625 V
MDC IDC MSMT LEADCHNL RV IMPEDANCE VALUE: 380 Ohm
MDC IDC MSMT LEADCHNL RV IMPEDANCE VALUE: 418 Ohm
MDC IDC MSMT LEADCHNL RV PACING THRESHOLD PULSEWIDTH: 0.4 ms
MDC IDC MSMT LEADCHNL RV SENSING INTR AMPL: 3 mV
MDC IDC SESS DTM: 20160229140051
MDC IDC SET ZONE DETECTION INTERVAL: 400 ms
Zone Setting Detection Interval: 350 ms

## 2014-12-21 ENCOUNTER — Encounter: Payer: Self-pay | Admitting: Cardiology

## 2014-12-24 ENCOUNTER — Encounter: Payer: Self-pay | Admitting: Internal Medicine

## 2015-01-04 ENCOUNTER — Encounter: Payer: Self-pay | Admitting: Cardiology

## 2015-03-11 ENCOUNTER — Ambulatory Visit (INDEPENDENT_AMBULATORY_CARE_PROVIDER_SITE_OTHER): Payer: BC Managed Care – PPO | Admitting: *Deleted

## 2015-03-11 DIAGNOSIS — I495 Sick sinus syndrome: Secondary | ICD-10-CM

## 2015-03-11 NOTE — Progress Notes (Signed)
Remote pacemaker transmission.   

## 2015-03-16 LAB — CUP PACEART REMOTE DEVICE CHECK
Battery Remaining Longevity: 116 mo
Battery Voltage: 3.03 V
Brady Statistic AP VP Percent: 0.18 %
Brady Statistic AP VS Percent: 63.3 %
Brady Statistic AS VP Percent: 0.03 %
Brady Statistic AS VS Percent: 36.49 %
Date Time Interrogation Session: 20160602131608
Lead Channel Impedance Value: 342 Ohm
Lead Channel Impedance Value: 475 Ohm
Lead Channel Impedance Value: 627 Ohm
Lead Channel Pacing Threshold Amplitude: 0.625 V
Lead Channel Sensing Intrinsic Amplitude: 3 mV
Lead Channel Sensing Intrinsic Amplitude: 3 mV
Lead Channel Sensing Intrinsic Amplitude: 3.375 mV
Lead Channel Setting Pacing Amplitude: 2.5 V
Lead Channel Setting Pacing Pulse Width: 0.4 ms
MDC IDC MSMT LEADCHNL RA PACING THRESHOLD PULSEWIDTH: 0.4 ms
MDC IDC MSMT LEADCHNL RA SENSING INTR AMPL: 3.375 mV
MDC IDC MSMT LEADCHNL RV IMPEDANCE VALUE: 399 Ohm
MDC IDC MSMT LEADCHNL RV PACING THRESHOLD AMPLITUDE: 1.125 V
MDC IDC MSMT LEADCHNL RV PACING THRESHOLD PULSEWIDTH: 0.4 ms
MDC IDC SET LEADCHNL RA PACING AMPLITUDE: 2 V
MDC IDC SET LEADCHNL RV SENSING SENSITIVITY: 2 mV
MDC IDC SET ZONE DETECTION INTERVAL: 350 ms
MDC IDC SET ZONE DETECTION INTERVAL: 400 ms
MDC IDC STAT BRADY RA PERCENT PACED: 63.48 %
MDC IDC STAT BRADY RV PERCENT PACED: 0.21 %

## 2015-03-30 ENCOUNTER — Encounter: Payer: Self-pay | Admitting: Cardiology

## 2015-03-31 ENCOUNTER — Encounter: Payer: Self-pay | Admitting: Internal Medicine

## 2015-04-19 DIAGNOSIS — E780 Pure hypercholesterolemia: Secondary | ICD-10-CM | POA: Diagnosis not present

## 2015-04-26 DIAGNOSIS — E780 Pure hypercholesterolemia: Secondary | ICD-10-CM | POA: Diagnosis not present

## 2015-04-26 DIAGNOSIS — I480 Paroxysmal atrial fibrillation: Secondary | ICD-10-CM | POA: Diagnosis not present

## 2015-04-26 DIAGNOSIS — B372 Candidiasis of skin and nail: Secondary | ICD-10-CM | POA: Diagnosis not present

## 2015-04-26 DIAGNOSIS — I495 Sick sinus syndrome: Secondary | ICD-10-CM | POA: Diagnosis not present

## 2015-04-30 DIAGNOSIS — Z1231 Encounter for screening mammogram for malignant neoplasm of breast: Secondary | ICD-10-CM | POA: Diagnosis not present

## 2015-06-15 ENCOUNTER — Ambulatory Visit (INDEPENDENT_AMBULATORY_CARE_PROVIDER_SITE_OTHER): Payer: Medicare Other | Admitting: *Deleted

## 2015-06-15 DIAGNOSIS — I495 Sick sinus syndrome: Secondary | ICD-10-CM

## 2015-06-15 NOTE — Progress Notes (Signed)
Remote pacemaker transmission.   

## 2015-06-22 LAB — CUP PACEART REMOTE DEVICE CHECK
Brady Statistic AP VP Percent: 0.4 %
Brady Statistic AP VS Percent: 70.7 %
Date Time Interrogation Session: 20160913113009
Lead Channel Pacing Threshold Amplitude: 0.625 V
Lead Channel Sensing Intrinsic Amplitude: 0.4 mV
Lead Channel Setting Pacing Amplitude: 2.5 V
MDC IDC MSMT LEADCHNL RA IMPEDANCE VALUE: 608 Ohm
MDC IDC MSMT LEADCHNL RA PACING THRESHOLD PULSEWIDTH: 0.4 ms
MDC IDC MSMT LEADCHNL RV IMPEDANCE VALUE: 399 Ohm
MDC IDC MSMT LEADCHNL RV PACING THRESHOLD AMPLITUDE: 2 V
MDC IDC MSMT LEADCHNL RV PACING THRESHOLD PULSEWIDTH: 0.4 ms
MDC IDC MSMT LEADCHNL RV SENSING INTR AMPL: 4 mV
MDC IDC SET LEADCHNL RA PACING AMPLITUDE: 2 V
MDC IDC SET LEADCHNL RV PACING PULSEWIDTH: 0.4 ms
MDC IDC SET LEADCHNL RV SENSING SENSITIVITY: 2 mV
MDC IDC SET ZONE DETECTION INTERVAL: 400 ms
MDC IDC STAT BRADY AS VS PERCENT: 28.9 %
Zone Setting Detection Interval: 350 ms

## 2015-06-28 ENCOUNTER — Encounter: Payer: Self-pay | Admitting: *Deleted

## 2015-06-29 ENCOUNTER — Encounter: Payer: Self-pay | Admitting: Cardiology

## 2015-07-06 DIAGNOSIS — J029 Acute pharyngitis, unspecified: Secondary | ICD-10-CM | POA: Diagnosis not present

## 2015-07-13 ENCOUNTER — Encounter: Payer: Self-pay | Admitting: *Deleted

## 2015-07-13 ENCOUNTER — Ambulatory Visit (INDEPENDENT_AMBULATORY_CARE_PROVIDER_SITE_OTHER): Payer: Medicare Other | Admitting: Cardiology

## 2015-07-13 ENCOUNTER — Encounter: Payer: Self-pay | Admitting: Cardiology

## 2015-07-13 VITALS — BP 139/83 | HR 65 | Ht 68.0 in | Wt 186.0 lb

## 2015-07-13 DIAGNOSIS — I495 Sick sinus syndrome: Secondary | ICD-10-CM

## 2015-07-13 DIAGNOSIS — Z950 Presence of cardiac pacemaker: Secondary | ICD-10-CM

## 2015-07-13 DIAGNOSIS — I480 Paroxysmal atrial fibrillation: Secondary | ICD-10-CM

## 2015-07-13 MED ORDER — APIXABAN 5 MG PO TABS: 5.0000 mg | ORAL_TABLET | Freq: Two times a day (BID) | ORAL | Status: DC

## 2015-07-13 MED ORDER — DILTIAZEM HCL ER COATED BEADS 120 MG PO CP24: 120.0000 mg | ORAL_CAPSULE | Freq: Every day | ORAL | Status: DC

## 2015-07-13 NOTE — Progress Notes (Signed)
Cardiology Office Note  Date: 07/13/2015   ID: [name], DOB [birth year], MRN [id number]  PCP: Rory Percy, MD  Primary Cardiologist: Rozann Lesches, MD   Chief Complaint  Patient presents with  . PAF    History of Present Illness: Tiffany Melton is a 64 y.o. female last seen in February. She presents for a routine follow-up visit today. She still describes intermittent palpitations, has used short acting Cardizem only 4 times in the last 6 months however. She really tries to avoid using any medication for most of her palpitation episodes.  She continues to follow with Dr. [name] in the device clinic, also has remote transmissions. Most recent assessment found 167 AT/AF episodes, the longest being around an hour in duration. We discussed this today.  CHADSVASC score is now 2 at age 1. Annual risk of stroke is approximately 2.3% on aspirin versus 0.8% on Eliquis. We discussed the thromboembolic risk associated with PAF and indication for anticoagulation.  ECG today shows sinus rhythm with PVCs.  She reports having lab work with Dr. Nadara Mustard in the last few months.   Past Medical History  Diagnosis Date  . Raynaud's disease   . Abnormal stress test     GXT 9/13, cath revealed normal coronary arteries  . Paroxysmal atrial fibrillation (HCC)   . Sick sinus syndrome [facility])     Past Surgical History  Procedure Laterality Date  . Back surgery    . Tonsillectomy    . Cholecystectomy    . Cardiac catheterization  2013  . Pacemaker insertion  05-04-14    MDT Advisa dual chamber pacemaker implanted by Dr Caryl Comes for SSS, atrial fibrillation post termination pauses  . Permanent pacemaker insertion N/A 05/04/2014    Procedure: PERMANENT PACEMAKER INSERTION;  Surgeon: Deboraha Sprang, MD;  Location: [facility] CATH LAB;  Service: Cardiovascular;  Laterality: N/A;    Current Outpatient Prescriptions  Medication Sig Dispense Refill  . diltiazem (CARDIZEM) 30 MG tablet Take 1-2 tablets (30-60  mg total) by mouth every 6 (six) hours as needed. palpitations 30 tablet 1  . simvastatin (ZOCOR) 10 MG tablet Take 1 tablet by mouth daily.    Marland Kitchen apixaban (ELIQUIS) 5 MG TABS tablet Take 1 tablet (5 mg total) by mouth 2 (two) times daily. 60 tablet 0  . diltiazem (CARDIZEM CD) 120 MG 24 hr capsule Take 1 capsule (120 mg total) by mouth daily. 30 capsule 4   No current facility-administered medications for this visit.    Allergies:  Review of patient's allergies indicates no known allergies.   Social History: The patient  reports that she quit smoking about 32 years ago. Her smoking use included Cigarettes. She started smoking about 52 years ago. She has a 35 pack-year smoking history. She has never used smokeless tobacco. She reports that she does not drink alcohol or use illicit drugs.   ROS:  Please see the history of present illness. Otherwise, complete review of systems is positive for palpitations.  All other systems are reviewed and negative.   Physical Exam: VS:  BP 139/83 mmHg  Pulse 65  Ht 5\' 8"  (1.727 m)  Wt 186 lb (84.369 kg)  BMI 28.29 kg/m2, BMI Body mass index is 28.29 kg/(m^2).  Wt Readings from Last 3 Encounters:  07/13/15 186 lb (84.369 kg)  11/30/14 185 lb (83.915 kg)  09/07/14 192 lb (87.091 kg)     Patient in no acute distress.  HEENT: Conjunctiva and lids normal, oropharynx clear with  moist mucosa.  Neck: Supple, no elevated JVP or carotid bruits, no thyromegaly.  Lungs: Clear to auscultation, nonlabored breathing at rest.  Cardiac: Regular rate and rhythm, no S3 or significant systolic murmur, no pericardial rub.  Abdomen: Soft, nontender, bowel sounds present.  Extremities: No pitting edema, distal pulses 2+.  Skin: Warm and dry.  Musculoskeletal: No kyphosis.  Neuropsychiatric: Alert and oriented x3, affect grossly appropriate.   ECG: ECG is ordered today.   Recent Labwork:  July 2015: Hemoglobin 12.8, platelets 207, potassium 3.5, BUN 9,  creatinine 0.48, AST 21, ALT 15  Assessment and Plan:  1. Symptomatic paroxysmal atrial fibrillation. CHADSVASC score is now 2. We discussed rationale for anticoagulation to reduce risk of stroke. She is in agreement and we will start Eliquis 5 mg twice daily, stop aspirin. Otherwise we will try Cardizem CD 120 mg daily to see if this improves her palpitations. She still has short acting Cardizem to use for longer episodes. If this is not effective, may need to consider antiarrhythmic therapy.  2. Sick sinus syndrome status post pacemaker placement, followed by Dr. [name].  3. History of normal coronary arteries at cardiac catheterization 2013.  Current medicines were reviewed with the patient today.   Orders Placed This Encounter  Procedures  . EKG 12-Lead    Disposition: FU with me in 4 months.   Signed, Satira Sark, MD, [facility] 07/13/2015 9:10 AM    [place] at [place], [place], [place] [address] Phone: [phone]; Fax: [phone]

## 2015-07-13 NOTE — Patient Instructions (Signed)
Your physician has recommended you make the following change in your medication:  Stop aspirin. Start eliquis 5 mg twice daily. You have been given 1 week of samples and also please activate your free-30 day trial card. Start cardizem cd 120 mg daily. Continue all other medications the same. Your physician recommends that you return for lab work in: 4 months just before your next visit to check your BMET & CBC. Your physician recommends that you schedule a follow-up appointment in: 4 months. You will receive a reminder letter in the mail in about 1-2 months reminding you to call and schedule your appointment. If you don't receive this letter, please contact our office.

## 2015-07-15 ENCOUNTER — Encounter: Payer: Self-pay | Admitting: Internal Medicine

## 2015-07-22 ENCOUNTER — Other Ambulatory Visit: Payer: Self-pay | Admitting: *Deleted

## 2015-07-22 ENCOUNTER — Telehealth: Payer: Self-pay | Admitting: Cardiology

## 2015-07-22 MED ORDER — APIXABAN 5 MG PO TABS: 5.0000 mg | ORAL_TABLET | Freq: Two times a day (BID) | ORAL | Status: DC

## 2015-07-22 NOTE — Telephone Encounter (Signed)
Patient advised to bring the paperwork completely filled out to our office and we could send the application in for her to get eliquis from the drug company.

## 2015-07-22 NOTE — Telephone Encounter (Signed)
Home until 12pm can leave message after this time if needed Would like to know if she can get help with medication asst form

## 2015-08-05 ENCOUNTER — Telehealth: Payer: Self-pay | Admitting: *Deleted

## 2015-08-05 ENCOUNTER — Other Ambulatory Visit: Payer: Self-pay | Admitting: *Deleted

## 2015-08-05 MED ORDER — DILTIAZEM HCL ER COATED BEADS 120 MG PO CP24: 120.0000 mg | ORAL_CAPSULE | Freq: Every day | ORAL | Status: DC

## 2015-08-05 NOTE — Telephone Encounter (Signed)
Tiffany Melton from [employer] pt assistance says pt is approved for free Eliquis. Will need to reapply by 10/09/2015.

## 2015-08-05 NOTE — Telephone Encounter (Signed)
Contacted [employer] to give the corrected [employer] ID information.

## 2015-08-05 NOTE — Telephone Encounter (Signed)
Andee Demers dob:05-Sep-2051     [employer] member id #[id number] gp# [employer]

## 2015-08-24 ENCOUNTER — Other Ambulatory Visit: Payer: Self-pay | Admitting: *Deleted

## 2015-08-24 DIAGNOSIS — Z5181 Encounter for therapeutic drug level monitoring: Secondary | ICD-10-CM

## 2015-08-24 DIAGNOSIS — I480 Paroxysmal atrial fibrillation: Secondary | ICD-10-CM

## 2015-08-24 NOTE — Progress Notes (Signed)
Lab work orders mailed to patient per her request and she is having these done at Dr. Mertha Finders office.

## 2015-09-10 ENCOUNTER — Ambulatory Visit (INDEPENDENT_AMBULATORY_CARE_PROVIDER_SITE_OTHER): Payer: Medicare Other | Admitting: Internal Medicine

## 2015-09-10 ENCOUNTER — Encounter: Payer: Self-pay | Admitting: Internal Medicine

## 2015-09-10 VITALS — BP 144/86 | HR 78 | Ht 69.0 in | Wt 186.0 lb

## 2015-09-10 DIAGNOSIS — I480 Paroxysmal atrial fibrillation: Secondary | ICD-10-CM

## 2015-09-10 DIAGNOSIS — I495 Sick sinus syndrome: Secondary | ICD-10-CM

## 2015-09-10 NOTE — Patient Instructions (Signed)
Your physician recommends that you continue on your current medications as directed. Please refer to the Current Medication list given to you today. Your next device check from home is on 12/13/15. Your physician recommends that you schedule a follow-up appointment in: 1 year with Dr. Allred. You can schedule this appointment today or you can wait for your letter to come in the mail in about 10 months reminding you to call and schedule this appointment. If you do not receive this letter, please contact our office for your appointment.  

## 2015-09-10 NOTE — Progress Notes (Signed)
PCP:  Rory Percy, MD Primary Cardiologist: Dr Domenic Polite  The patient presents today for routine electrophysiology followup.  Since her last visit, the patient reports doing very well.  Her afib burden is low.  She reports symptoms of tachypalpitations and fatigue during her afib.  Episodes are short lived typically. Today, she denies symptoms of chest pain, shortness of breath, orthopnea, PND, lower extremity edema, dizziness, presyncope, syncope, or neurologic sequela.  The patient feels that she is tolerating medications without difficulties and is otherwise without complaint today.   Past Medical History  Diagnosis Date  . Raynaud's disease   . Abnormal stress test     GXT 9/13, cath revealed normal coronary arteries  . Paroxysmal atrial fibrillation (HCC)   . Sick sinus syndrome [facility])    Past Surgical History  Procedure Laterality Date  . Back surgery    . Tonsillectomy    . Cholecystectomy    . Cardiac catheterization  2013  . Pacemaker insertion  05-04-14    MDT Advisa dual chamber pacemaker implanted by Dr Caryl Comes for SSS, atrial fibrillation post termination pauses  . Permanent pacemaker insertion N/A 05/04/2014    Procedure: PERMANENT PACEMAKER INSERTION;  Surgeon: Deboraha Sprang, MD;  Location: [facility] CATH LAB;  Service: Cardiovascular;  Laterality: N/A;    Current Outpatient Prescriptions  Medication Sig Dispense Refill  . apixaban (ELIQUIS) 5 MG TABS tablet Take 1 tablet (5 mg total) by mouth 2 (two) times daily. 180 tablet 3  . diltiazem (CARDIZEM CD) 120 MG 24 hr capsule Take 1 capsule (120 mg total) by mouth daily. 90 capsule 2  . diltiazem (CARDIZEM) 30 MG tablet Take 1-2 tablets (30-60 mg total) by mouth every 6 (six) hours as needed. palpitations 30 tablet 1  . simvastatin (ZOCOR) 10 MG tablet Take 1 tablet by mouth daily.     No current facility-administered medications for this visit.    No Known Allergies  Social History   Social History  . Marital Status:  Married    Spouse Name: N/A  . Number of Children: N/A  . Years of Education: N/A   Occupational History  . Not on file.   Social History Main Topics  . Smoking status: Former Smoker -- 1.00 packs/day for 35 years    Types: Cigarettes    Start date: 10/09/1962    Quit date: 08/09/1982  . Smokeless tobacco: Never Used  . Alcohol Use: No  . Drug Use: No  . Sexual Activity: Not on file   Other Topics Concern  . Not on file   Social History Narrative    Family History  Problem Relation Age of Onset  . Hypertension      ROS-  All systems are reviewed and are negative except as outlined in the HPI above  Physical Exam: Filed Vitals:   09/10/15 1138  BP: 144/86  Pulse: 78  Height: 5\' 9"  (1.753 m)  Weight: 186 lb (84.369 kg)  SpO2: 100%    GEN- The patient is well appearing, alert and oriented x 3 today.   Head- normocephalic, atraumatic Eyes-  Sclera clear, conjunctiva pink Ears- hearing intact Oropharynx- clear Neck- supple, no JVP Lymph- no cervical lymphadenopathy Lungs- Clear to ausculation bilaterally, normal work of breathing Heart- Regular rate and rhythm, no murmurs, rubs or gallops, PMI not laterally displaced GI- soft, NT, ND, + BS Extremities- no clubbing, cyanosis, or edema Neuro- strength and sensation are intact Pacemaker pocket is well healed  Pacemaker interrogation is reviewed  today and normal  Assessment and Plan:  1. afib Burden is low (0.8% by PPM interrogation today--> 0.5% last year) She has prn cardizem.  She is not interested in increasing daily cardizem or AAD therapy.  She is also not interested in prn flecainide. Lifestyle modification was discussed today Her chads2vasc score is 2 . She is doing well with eliquis,  2. Sick sinus with post termination pauses Normal pacemaker function See Pace Art report No changes today RV output is elevated though she never RV paces  3. Overweight Regular exercise is encouraged  She will  follow-up with Dr Domenic Polite for AF management carelink every 3 months I will see again in 1 year  Thompson Grayer MD, [facility] 09/10/2015 12:26 PM

## 2015-09-28 ENCOUNTER — Other Ambulatory Visit: Payer: Self-pay | Admitting: *Deleted

## 2015-09-28 MED ORDER — APIXABAN 5 MG PO TABS: 5.0000 mg | ORAL_TABLET | Freq: Two times a day (BID) | ORAL | Status: DC

## 2015-09-30 LAB — CUP PACEART INCLINIC DEVICE CHECK
Brady Statistic AP VS Percent: 68.29 %
Brady Statistic AS VS Percent: 31.46 %
Brady Statistic RA Percent Paced: 68.51 %
Implantable Lead Implant Date: 20150727
Implantable Lead Location: 753859
Implantable Lead Model: 5076
Lead Channel Impedance Value: 627 Ohm
Lead Channel Pacing Threshold Amplitude: 0.75 V
Lead Channel Pacing Threshold Pulse Width: 0.4 ms
Lead Channel Sensing Intrinsic Amplitude: 4.625 mV
Lead Channel Setting Pacing Amplitude: 2 V
Lead Channel Setting Pacing Amplitude: 3.5 V
MDC IDC LEAD IMPLANT DT: 20150727
MDC IDC LEAD LOCATION: 753860
MDC IDC MSMT BATTERY REMAINING LONGEVITY: 108 mo
MDC IDC MSMT BATTERY VOLTAGE: 3.02 V
MDC IDC MSMT LEADCHNL RA IMPEDANCE VALUE: 475 Ohm
MDC IDC MSMT LEADCHNL RA PACING THRESHOLD PULSEWIDTH: 0.4 ms
MDC IDC MSMT LEADCHNL RA SENSING INTR AMPL: 3.5 mV
MDC IDC MSMT LEADCHNL RV IMPEDANCE VALUE: 342 Ohm
MDC IDC MSMT LEADCHNL RV IMPEDANCE VALUE: 399 Ohm
MDC IDC MSMT LEADCHNL RV PACING THRESHOLD AMPLITUDE: 2.25 V
MDC IDC SESS DTM: 20161202180154
MDC IDC SET LEADCHNL RV PACING PULSEWIDTH: 0.4 ms
MDC IDC SET LEADCHNL RV SENSING SENSITIVITY: 2 mV
MDC IDC STAT BRADY AP VP PERCENT: 0.21 %
MDC IDC STAT BRADY AS VP PERCENT: 0.03 %
MDC IDC STAT BRADY RV PERCENT PACED: 0.24 %

## 2015-11-01 DIAGNOSIS — M545 Low back pain: Secondary | ICD-10-CM | POA: Diagnosis not present

## 2015-11-08 ENCOUNTER — Other Ambulatory Visit: Payer: Self-pay | Admitting: Cardiology

## 2015-11-08 DIAGNOSIS — I480 Paroxysmal atrial fibrillation: Secondary | ICD-10-CM | POA: Diagnosis not present

## 2015-11-08 DIAGNOSIS — E7800 Pure hypercholesterolemia, unspecified: Secondary | ICD-10-CM | POA: Diagnosis not present

## 2015-11-08 MED ORDER — DILTIAZEM HCL ER COATED BEADS 120 MG PO CP24: 120.0000 mg | ORAL_CAPSULE | Freq: Every day | ORAL | Status: DC

## 2015-11-08 NOTE — Telephone Encounter (Signed)
Medication sent to pharmacy  

## 2015-11-08 NOTE — Telephone Encounter (Signed)
diltiazem (CARDIZEM CD) 120 MG 24 hr capsule  Please send RX to [facility]   Fax # [phone]

## 2015-11-15 ENCOUNTER — Encounter: Payer: Self-pay | Admitting: Cardiology

## 2015-11-15 ENCOUNTER — Ambulatory Visit (INDEPENDENT_AMBULATORY_CARE_PROVIDER_SITE_OTHER): Payer: Medicare Other | Admitting: Cardiology

## 2015-11-15 VITALS — BP 126/74 | HR 74 | Ht 69.0 in | Wt 188.6 lb

## 2015-11-15 DIAGNOSIS — I480 Paroxysmal atrial fibrillation: Secondary | ICD-10-CM | POA: Diagnosis not present

## 2015-11-15 DIAGNOSIS — I495 Sick sinus syndrome: Secondary | ICD-10-CM

## 2015-11-15 NOTE — Patient Instructions (Signed)
Your physician recommends that you continue on your current medications as directed. Please refer to the Current Medication list given to you today. Your physician recommends that you schedule a follow-up appointment in: 6 months. You will receive a reminder letter in the mail in about 4 months reminding you to call and schedule your appointment. If you don't receive this letter, please contact our office. 

## 2015-11-15 NOTE — Progress Notes (Signed)
Cardiology Office Note  Date: 11/15/2015   ID: Tiffany Melton, DOB 1950-11-04, MRN [id number]  PCP: Rory Percy, MD  Primary Cardiologist: Rozann Lesches, MD   Chief Complaint  Patient presents with  . Atrial Fibrillation    History of Present Illness: Tiffany Melton is a 65 y.o. female last seen in October 2016. Interval follow-up with Dr. [name] noted in December 2016. She was noted to have a relatively low PAF burden and continued on Cardizem as well as Eliquis.  She presents for a follow-up visit today, states that she feels much better in terms of palpitation frequency on her current regimen. She is on Cardizem CD 120 mg daily as well as Eliquis. No reported bleeding problems. She had lab work recently with Dr. Nadara Mustard, results to be requested.  Past Medical History  Diagnosis Date  . Raynaud's disease   . Abnormal stress test     GXT 9/13, cath revealed normal coronary arteries  . Paroxysmal atrial fibrillation (HCC)   . Sick sinus syndrome [facility])     Current Outpatient Prescriptions  Medication Sig Dispense Refill  . apixaban (ELIQUIS) 5 MG TABS tablet Take 1 tablet (5 mg total) by mouth 2 (two) times daily. 180 tablet 3  . diltiazem (CARDIZEM CD) 120 MG 24 hr capsule Take 1 capsule (120 mg total) by mouth daily. 90 capsule 3  . diltiazem (CARDIZEM) 30 MG tablet Take 1-2 tablets (30-60 mg total) by mouth every 6 (six) hours as needed. palpitations 30 tablet 1  . simvastatin (ZOCOR) 10 MG tablet Take 1 tablet by mouth daily.     No current facility-administered medications for this visit.   Allergies:  Review of patient's allergies indicates no known allergies.   Social History: The patient  reports that she quit smoking about 33 years ago. Her smoking use included Cigarettes. She started smoking about 53 years ago. She has a 35 pack-year smoking history. She has never used smokeless tobacco. She reports that she does not drink alcohol or use illicit drugs.   ROS:   Please see the history of present illness. Otherwise, complete review of systems is positive for none.  All other systems are reviewed and negative.   Physical Exam: VS:  BP 126/74 mmHg  Pulse 74  Ht 5\' 9"  (1.753 m)  Wt 188 lb 9.6 oz (85.548 kg)  BMI 27.84 kg/m2  SpO2 100%, BMI Body mass index is 27.84 kg/(m^2).  Wt Readings from Last 3 Encounters:  11/15/15 188 lb 9.6 oz (85.548 kg)  09/10/15 186 lb (84.369 kg)  07/13/15 186 lb (84.369 kg)    Patient in no acute distress.  HEENT: Conjunctiva and lids normal, oropharynx clear with moist mucosa.  Neck: Supple, no elevated JVP or carotid bruits, no thyromegaly.  Lungs: Clear to auscultation, nonlabored breathing at rest.  Cardiac: Regular rate and rhythm, no S3 or significant systolic murmur, no pericardial rub.  Abdomen: Soft, nontender, bowel sounds present.  Extremities: No pitting edema, distal pulses 2+.   ECG: I reviewed her tracing from 07/13/2015 which showed normal sinus rhythm with PVCs, inferior Q waves.  Assessment and Plan:  1. Paroxysmal atrial fibrillation. Symptomatically well controlled on current regimen including Cardizem CD and Eliquis. Low arrhythmia burden by recent device interrogation. Continue current regimen.  2. Sick sinus syndrome status post Medtronic pacemaker placement. Keep follow-up with Dr. [name].  Current medicines were reviewed with the patient today.  Disposition: FU with me in 6 months.   Signed,  Satira Sark, MD, [facility] 11/15/2015 10:51 AM    [facility] [place], [place], [place] [address] Phone: [phone]; Fax: [phone]

## 2015-11-30 DIAGNOSIS — J069 Acute upper respiratory infection, unspecified: Secondary | ICD-10-CM | POA: Diagnosis not present

## 2015-12-09 ENCOUNTER — Telehealth: Payer: Self-pay | Admitting: *Deleted

## 2015-12-09 NOTE — Telephone Encounter (Signed)
Patient calling office this morning with c/o chest pain.  Stated that pain was on her right side, went up to face and down to her stomach.  Rated  7/10.  Lasted approximately 10-12 minutes then eased off.  Stated that she did not take her NTG as she was not sure due to being on her right side.  Now she has no complaints of chest pain, SOB, or dizziness.  Does feel slight weakness, but mostly feels back to her usual self.  Reviewed use of nitro with patient & need to call 911 / go to ED if occurs again.  Message sent to provider for any further advice.

## 2015-12-13 ENCOUNTER — Ambulatory Visit (INDEPENDENT_AMBULATORY_CARE_PROVIDER_SITE_OTHER): Payer: Medicare Other | Admitting: *Deleted

## 2015-12-13 DIAGNOSIS — I495 Sick sinus syndrome: Secondary | ICD-10-CM

## 2015-12-17 ENCOUNTER — Ambulatory Visit (INDEPENDENT_AMBULATORY_CARE_PROVIDER_SITE_OTHER): Payer: Medicare Other | Admitting: Cardiology

## 2015-12-17 ENCOUNTER — Encounter: Payer: Self-pay | Admitting: Cardiology

## 2015-12-17 VITALS — BP 121/77 | HR 66 | Ht 69.0 in | Wt 185.2 lb

## 2015-12-17 DIAGNOSIS — I495 Sick sinus syndrome: Secondary | ICD-10-CM

## 2015-12-17 DIAGNOSIS — R079 Chest pain, unspecified: Secondary | ICD-10-CM

## 2015-12-17 DIAGNOSIS — R0789 Other chest pain: Secondary | ICD-10-CM

## 2015-12-17 DIAGNOSIS — I480 Paroxysmal atrial fibrillation: Secondary | ICD-10-CM | POA: Diagnosis not present

## 2015-12-17 NOTE — Progress Notes (Signed)
Remote pacemaker transmission.   

## 2015-12-17 NOTE — Patient Instructions (Addendum)
Your physician recommends that you continue on your current medications as directed. Please refer to the Current Medication list given to you today. Please use your diltiazem 30 mg more frequently for breakthrough palpitations. Call office with response so that dose may be adjusted if needed. Your physician recommends that you schedule a follow-up appointment in: 6 months. You will receive a reminder letter in the mail in about 4 months reminding you to call and schedule your appointment. If you don't receive this letter, please contact our office.

## 2015-12-17 NOTE — Progress Notes (Signed)
Cardiology Office Note  Date: 12/17/2015   ID: [name], DOB Dec 19, 1950, MRN [id number]  PCP: Rory Percy, MD  Primary Cardiologist: Rozann Lesches, MD   Chief Complaint  Patient presents with  . PAF    History of Present Illness: Tiffany Melton is a 65 y.o. female last seen in February. She presents to discuss interval symptoms. She states that she has had a few episodes of sudden palpitations associated with discomfort , one occurred when she was at work and began in her neck moving down into the chest and abdomen, another occurred when she was at home. Both without specific precipitant other than perhaps stress on one occasion. She has not had any exertional chest pain.  She has not remembered to take her short acting Cardizem with these episodes.  She has a known history of PAF that we have been managing medically, had a relatively low arrhythmia burden based on her prior device check, most recent interrogation pending. She feels better today, I reviewed her tracing which shows sinus rhythm with short PR interval.  She is a history of normal coronary arteries at cardiac catheterization in 2013, also follow-up cardiac CT in 2015 showing calcium score 52.  Past Medical History  Diagnosis Date  . Raynaud's disease   . Abnormal stress test     GXT 9/13, cath revealed normal coronary arteries  . Paroxysmal atrial fibrillation (HCC)   . Sick sinus syndrome [facility])     Past Surgical History  Procedure Laterality Date  . Back surgery    . Tonsillectomy    . Cholecystectomy    . Cardiac catheterization  2013  . Pacemaker insertion  05-04-14    MDT Advisa dual chamber pacemaker implanted by Dr Caryl Comes for SSS, atrial fibrillation post termination pauses  . Permanent pacemaker insertion N/A 05/04/2014    Procedure: PERMANENT PACEMAKER INSERTION;  Surgeon: Deboraha Sprang, MD;  Location: [facility] CATH LAB;  Service: Cardiovascular;  Laterality: N/A;    Current Outpatient Prescriptions    Medication Sig Dispense Refill  . apixaban (ELIQUIS) 5 MG TABS tablet Take 1 tablet (5 mg total) by mouth 2 (two) times daily. 180 tablet 3  . diltiazem (CARDIZEM CD) 120 MG 24 hr capsule Take 1 capsule (120 mg total) by mouth daily. 90 capsule 3  . diltiazem (CARDIZEM) 30 MG tablet Take 1-2 tablets (30-60 mg total) by mouth every 6 (six) hours as needed. palpitations 30 tablet 1  . simvastatin (ZOCOR) 10 MG tablet Take 1 tablet by mouth daily.     No current facility-administered medications for this visit.   Allergies:  Review of patient's allergies indicates no known allergies.   Social History: The patient  reports that she quit smoking about 33 years ago. Her smoking use included Cigarettes. She started smoking about 53 years ago. She has a 35 pack-year smoking history. She has never used smokeless tobacco. She reports that she does not drink alcohol or use illicit drugs.   ROS:  Please see the history of present illness. Otherwise, complete review of systems is positive for none.  All other systems are reviewed and negative.   Physical Exam: VS:  BP 121/77 mmHg  Pulse 66  Ht 5\' 9"  (1.753 m)  Wt 185 lb 3.2 oz (84.006 kg)  BMI 27.34 kg/m2  SpO2 99%, BMI Body mass index is 27.34 kg/(m^2).  Wt Readings from Last 3 Encounters:  12/17/15 185 lb 3.2 oz (84.006 kg)  11/15/15 188 lb 9.6  oz (85.548 kg)  09/10/15 186 lb (84.369 kg)    Patient in no acute distress.  HEENT: Conjunctiva and lids normal, oropharynx clear with moist mucosa.  Neck: Supple, no elevated JVP or carotid bruits, no thyromegaly.  Lungs: Clear to auscultation, nonlabored breathing at rest.  Cardiac: Regular rate and rhythm, no S3 or significant systolic murmur, no pericardial rub.  Abdomen: Soft, nontender, bowel sounds present.  Extremities: No pitting edema, distal pulses 2+.   ECG: I personally reviewed the prior tracing from 07/13/2015 which showed sinus rhythm with PVCs.  Recent Labwork: No results  found for requested labs within last 365 days.     Component Value Date/Time   CHOL 195 05/01/2014 0230   TRIG 69 05/01/2014 0230   HDL 55 05/01/2014 0230   CHOLHDL 3.5 05/01/2014 0230   VLDL 14 05/01/2014 0230   LDLCALC 126* 05/01/2014 0230    Other Studies Reviewed Today:  Cardiac catheterization in 08/30/2012: Coronary angiography:   Coronary dominance: Right  Left mainstem: LM normal  Left anterior descending (LAD): Moderate sized vessel. Normal throughout. Mid diagonal large and normal.  Left circumflex (LCx): AV groove normal. MOM moderate sized and normal. PL small and normal.   Right coronary artery (RCA): Very large and dominant. Normal. PDA large and normal. PL moderate sized and normal  Left ventriculography: Left ventricular systolic function is normal, LVEF is estimated at 65%, there is no significant mitral regurgitation   Final Conclusions: Normal coronaries. NL LV function.  Cardiac CT 05/01/2014: FINDINGS: Technical quality: Suboptimal. There was a malfunction with the scanner such that the scanner was decoupled from the injector and bolus timing was significantly off (images were acquired during the venous phase rather than arterial phase of acquisition).  Heart rate: 60-62  CORONARY ARTERIES:  Evaluation of coronary artery is was nondiagnostic secondary to the limitations of the examination.  CORONARY CALCIUM: Total Agatston Score: 52  MESA database percentile: 93rd  AORTA AND PULMONARY MEASUREMENTS:  Aortic root (21 - 40 mm):  23 mm at the annulus  31 mm at the sinuses of Valsalva  27 mm at the sinotubular junction  Ascending aorta: (< 40 mm): 35 mm  Descending aorta: (< 40 mm): 28 mm  Main pulmonary artery: (< 30 mm): 29 mm  OTHER FINDINGS: The aortic valve is mildly calcified, predominantly on the left cusp near the commissure with the right cusp and near the commissure with the noncoronary  cusp. Small amount of scarring in the inferior segment of the lingula. Dependent subsegmental atelectasis in the lower lobes of the lungs bilaterally. No suspicious appearing pulmonary nodules or masses are identified within the visualized thorax. Visualized portions of the upper abdomen are unremarkable. Old compression fracture of a mid thoracic vertebral body (likely T9) with approximately 15% loss of anterior vertebral body height.  IMPRESSION: 1. Despite the limitations of today's examination, there is no aortic root or ascending aorta mass identified. 2. Mild sclerosis of the aortic valve. 3. The patient's total coronary artery calcium score is 35 which is 93rd percentile for patient's of matched age, gender and race/ethnicity. 4. Additional incidental findings, as above.  Assessment and Plan:  1. Recent episodes of palpitations and chest discomfort, at this point suspect symptomatic PAF based on her history. She is in sinus rhythm today. We discussed medication adjustments, for now she will try and be more consistent with using short acting Cardizem when these events occur. If frequency increases we will then increase her Cardizem CD dose.  If device interrogation indicates significantly increased atrial fibrillation burden, antiarrhythmic therapy such as flecainide could be considered.  2. History of normal coronary arteries by cardiac catheterization 2013.  3. Sick sinus syndrome status post Medtronic pacemaker placement, followed by Dr. [name].  Current medicines were reviewed with the patient today.   Orders Placed This Encounter  Procedures  . EKG 12-Lead    Disposition: FU with me in 6 months.   Signed, Satira Sark, MD, [facility] 12/17/2015 9:21 AM    [facility] [place] [facility] [place], [place], [place] [address] Phone: [phone]; Fax: [phone]

## 2015-12-18 LAB — CUP PACEART REMOTE DEVICE CHECK
Battery Voltage: 3.02 V
Brady Statistic AP VP Percent: 0.11 %
Brady Statistic AS VP Percent: 0.02 %
Brady Statistic RA Percent Paced: 72.59 %
Brady Statistic RV Percent Paced: 0.14 %
Date Time Interrogation Session: 20170306153247
Implantable Lead Implant Date: 20150727
Implantable Lead Location: 753860
Lead Channel Impedance Value: 361 Ohm
Lead Channel Impedance Value: 418 Ohm
Lead Channel Pacing Threshold Amplitude: 0.625 V
Lead Channel Pacing Threshold Pulse Width: 0.4 ms
Lead Channel Pacing Threshold Pulse Width: 0.4 ms
Lead Channel Sensing Intrinsic Amplitude: 4.5 mV
Lead Channel Setting Pacing Amplitude: 2 V
Lead Channel Setting Pacing Pulse Width: 0.4 ms
MDC IDC LEAD IMPLANT DT: 20150727
MDC IDC LEAD LOCATION: 753859
MDC IDC MSMT BATTERY REMAINING LONGEVITY: 106 mo
MDC IDC MSMT LEADCHNL RA IMPEDANCE VALUE: 532 Ohm
MDC IDC MSMT LEADCHNL RA IMPEDANCE VALUE: 684 Ohm
MDC IDC MSMT LEADCHNL RA SENSING INTR AMPL: 0.375 mV
MDC IDC MSMT LEADCHNL RA SENSING INTR AMPL: 0.375 mV
MDC IDC MSMT LEADCHNL RV PACING THRESHOLD AMPLITUDE: 1.75 V
MDC IDC MSMT LEADCHNL RV SENSING INTR AMPL: 4.5 mV
MDC IDC SET LEADCHNL RV PACING AMPLITUDE: 3.5 V
MDC IDC SET LEADCHNL RV SENSING SENSITIVITY: 2 mV
MDC IDC STAT BRADY AP VS PERCENT: 72.48 %
MDC IDC STAT BRADY AS VS PERCENT: 27.39 %

## 2015-12-18 NOTE — Progress Notes (Signed)
Normal remote reviewed. Stable AF burden (0.7%), +Eliquis, V rates not well controlled.  Next Carelink 03/13/16

## 2015-12-22 ENCOUNTER — Encounter: Payer: Self-pay | Admitting: Cardiology

## 2016-01-18 DIAGNOSIS — K625 Hemorrhage of anus and rectum: Secondary | ICD-10-CM | POA: Diagnosis not present

## 2016-01-18 DIAGNOSIS — K5900 Constipation, unspecified: Secondary | ICD-10-CM | POA: Diagnosis not present

## 2016-01-31 DIAGNOSIS — Z7901 Long term (current) use of anticoagulants: Secondary | ICD-10-CM | POA: Diagnosis not present

## 2016-01-31 DIAGNOSIS — Z9049 Acquired absence of other specified parts of digestive tract: Secondary | ICD-10-CM | POA: Diagnosis not present

## 2016-01-31 DIAGNOSIS — K625 Hemorrhage of anus and rectum: Secondary | ICD-10-CM | POA: Diagnosis not present

## 2016-01-31 DIAGNOSIS — K5900 Constipation, unspecified: Secondary | ICD-10-CM | POA: Diagnosis not present

## 2016-01-31 DIAGNOSIS — Z79899 Other long term (current) drug therapy: Secondary | ICD-10-CM | POA: Diagnosis not present

## 2016-01-31 DIAGNOSIS — Z950 Presence of cardiac pacemaker: Secondary | ICD-10-CM | POA: Diagnosis not present

## 2016-02-28 DIAGNOSIS — K625 Hemorrhage of anus and rectum: Secondary | ICD-10-CM | POA: Diagnosis not present

## 2016-03-20 ENCOUNTER — Ambulatory Visit (INDEPENDENT_AMBULATORY_CARE_PROVIDER_SITE_OTHER): Payer: Medicare Other | Admitting: *Deleted

## 2016-03-20 DIAGNOSIS — I495 Sick sinus syndrome: Secondary | ICD-10-CM

## 2016-03-20 NOTE — Progress Notes (Signed)
Remote pacemaker transmission.   

## 2016-03-23 LAB — CUP PACEART REMOTE DEVICE CHECK
Battery Voltage: 3.02 V
Brady Statistic AP VP Percent: 0.2 %
Brady Statistic AS VP Percent: 0.07 %
Brady Statistic AS VS Percent: 34.12 %
Brady Statistic RA Percent Paced: 65.81 %
Date Time Interrogation Session: 20170612114835
Implantable Lead Implant Date: 20150727
Implantable Lead Location: 753859
Implantable Lead Model: 5076
Lead Channel Impedance Value: 342 Ohm
Lead Channel Impedance Value: 456 Ohm
Lead Channel Impedance Value: 608 Ohm
Lead Channel Pacing Threshold Amplitude: 0.75 V
Lead Channel Pacing Threshold Amplitude: 1.625 V
Lead Channel Pacing Threshold Pulse Width: 0.4 ms
Lead Channel Sensing Intrinsic Amplitude: 4 mV
Lead Channel Setting Pacing Amplitude: 2 V
Lead Channel Setting Pacing Pulse Width: 0.4 ms
MDC IDC LEAD IMPLANT DT: 20150727
MDC IDC LEAD LOCATION: 753860
MDC IDC MSMT BATTERY REMAINING LONGEVITY: 104 mo
MDC IDC MSMT LEADCHNL RA PACING THRESHOLD PULSEWIDTH: 0.4 ms
MDC IDC MSMT LEADCHNL RA SENSING INTR AMPL: 4 mV
MDC IDC MSMT LEADCHNL RA SENSING INTR AMPL: 4 mV
MDC IDC MSMT LEADCHNL RV IMPEDANCE VALUE: 399 Ohm
MDC IDC MSMT LEADCHNL RV SENSING INTR AMPL: 4 mV
MDC IDC SET LEADCHNL RV PACING AMPLITUDE: 3.5 V
MDC IDC SET LEADCHNL RV SENSING SENSITIVITY: 2 mV
MDC IDC STAT BRADY AP VS PERCENT: 65.61 %
MDC IDC STAT BRADY RV PERCENT PACED: 0.26 %

## 2016-03-30 ENCOUNTER — Encounter: Payer: Self-pay | Admitting: Cardiology

## 2016-05-15 DIAGNOSIS — Z1231 Encounter for screening mammogram for malignant neoplasm of breast: Secondary | ICD-10-CM | POA: Diagnosis not present

## 2016-05-25 DIAGNOSIS — I480 Paroxysmal atrial fibrillation: Secondary | ICD-10-CM | POA: Diagnosis not present

## 2016-05-25 DIAGNOSIS — E7800 Pure hypercholesterolemia, unspecified: Secondary | ICD-10-CM | POA: Diagnosis not present

## 2016-05-30 DIAGNOSIS — I480 Paroxysmal atrial fibrillation: Secondary | ICD-10-CM | POA: Diagnosis not present

## 2016-05-30 DIAGNOSIS — I495 Sick sinus syndrome: Secondary | ICD-10-CM | POA: Diagnosis not present

## 2016-05-30 DIAGNOSIS — Z6829 Body mass index (BMI) 29.0-29.9, adult: Secondary | ICD-10-CM | POA: Diagnosis not present

## 2016-06-19 ENCOUNTER — Other Ambulatory Visit: Payer: Self-pay | Admitting: *Deleted

## 2016-06-19 ENCOUNTER — Ambulatory Visit (INDEPENDENT_AMBULATORY_CARE_PROVIDER_SITE_OTHER): Payer: Medicare Other | Admitting: *Deleted

## 2016-06-19 ENCOUNTER — Telehealth: Payer: Self-pay | Admitting: Cardiology

## 2016-06-19 DIAGNOSIS — I495 Sick sinus syndrome: Secondary | ICD-10-CM | POA: Diagnosis not present

## 2016-06-19 MED ORDER — DILTIAZEM HCL ER COATED BEADS 120 MG PO CP24: 120.0000 mg | ORAL_CAPSULE | Freq: Every day | ORAL | 3 refills | Status: DC

## 2016-06-19 NOTE — Progress Notes (Signed)
Remote pacemaker transmission.   

## 2016-06-19 NOTE — Telephone Encounter (Signed)
LMOVM reminding pt to send remote transmission.   

## 2016-06-21 LAB — CUP PACEART REMOTE DEVICE CHECK
Battery Remaining Longevity: 99 mo
Brady Statistic AP VS Percent: 75.09 %
Brady Statistic RV Percent Paced: 0.23 %
Implantable Lead Location: 753860
Implantable Lead Model: 5076
Lead Channel Impedance Value: 399 Ohm
Lead Channel Pacing Threshold Pulse Width: 0.4 ms
Lead Channel Pacing Threshold Pulse Width: 0.4 ms
Lead Channel Sensing Intrinsic Amplitude: 0.25 mV
Lead Channel Sensing Intrinsic Amplitude: 0.25 mV
Lead Channel Sensing Intrinsic Amplitude: 3.625 mV
Lead Channel Setting Pacing Amplitude: 3.5 V
Lead Channel Setting Sensing Sensitivity: 2 mV
MDC IDC LEAD IMPLANT DT: 20150727
MDC IDC LEAD IMPLANT DT: 20150727
MDC IDC LEAD LOCATION: 753859
MDC IDC MSMT BATTERY VOLTAGE: 3.02 V
MDC IDC MSMT LEADCHNL RA IMPEDANCE VALUE: 418 Ohm
MDC IDC MSMT LEADCHNL RA IMPEDANCE VALUE: 570 Ohm
MDC IDC MSMT LEADCHNL RA PACING THRESHOLD AMPLITUDE: 0.75 V
MDC IDC MSMT LEADCHNL RV IMPEDANCE VALUE: 342 Ohm
MDC IDC MSMT LEADCHNL RV PACING THRESHOLD AMPLITUDE: 2 V
MDC IDC MSMT LEADCHNL RV SENSING INTR AMPL: 3.625 mV
MDC IDC SESS DTM: 20170911173856
MDC IDC SET LEADCHNL RA PACING AMPLITUDE: 2 V
MDC IDC SET LEADCHNL RV PACING PULSEWIDTH: 0.4 ms
MDC IDC STAT BRADY AP VP PERCENT: 0.17 %
MDC IDC STAT BRADY AS VP PERCENT: 0.07 %
MDC IDC STAT BRADY AS VS PERCENT: 24.68 %
MDC IDC STAT BRADY RA PERCENT PACED: 75.25 %

## 2016-06-22 ENCOUNTER — Encounter: Payer: Self-pay | Admitting: Cardiology

## 2016-06-23 NOTE — Progress Notes (Signed)
Cardiology Office Note  Date: 06/26/2016   ID: Tiffany Melton, DOB 10/17/1950, MRN [id number]  PCP: Tiffany Percy, MD  Primary Cardiologist: Tiffany Lesches, MD   Chief Complaint  Patient presents with  . PAF    History of Present Illness: Tiffany Melton is a 65 y.o. female last seen in March. She presents for a routine follow-up visit. Since last evaluation she has not had any significant palpitations to require short acting Cardizem. She has been trying to walk for exercise, somewhat frustrated that she has not been able to lose any weight. We discussed her diet today.  She continues to follow in the device clinic with Dr. Rayann Melton, Medtronic pacemaker in place. Recent device interrogation demonstrated 1.6% atrial fibrillation burden. She continues on Eliquis, no reported bleeding episodes. I reviewed her most recent lab work as outlined below.  Past Medical History:  Diagnosis Date  . Abnormal stress test    GXT 9/13, cath revealed normal coronary arteries  . Paroxysmal atrial fibrillation (HCC)   . Raynaud's disease   . Sick sinus syndrome [facility]     Past Surgical History:  Procedure Laterality Date  . BACK SURGERY    . CARDIAC CATHETERIZATION  2013  . CHOLECYSTECTOMY    . PACEMAKER INSERTION  05-04-14   MDT Advisa dual chamber pacemaker implanted by Dr Caryl Comes for SSS, atrial fibrillation post termination pauses  . PERMANENT PACEMAKER INSERTION N/A 05/04/2014   Procedure: PERMANENT PACEMAKER INSERTION;  Surgeon: Deboraha Sprang, MD;  Location: [facility] CATH LAB;  Service: Cardiovascular;  Laterality: N/A;  . TONSILLECTOMY      Current Outpatient Prescriptions  Medication Sig Dispense Refill  . apixaban (ELIQUIS) 5 MG TABS tablet Take 1 tablet (5 mg total) by mouth 2 (two) times daily. 180 tablet 3  . diltiazem (CARDIZEM CD) 120 MG 24 hr capsule Take 1 capsule (120 mg total) by mouth daily. 90 capsule 3  . simvastatin (ZOCOR) 10 MG tablet Take 1 tablet by mouth daily.     No  current facility-administered medications for this visit.    Allergies:  Review of patient's allergies indicates no known allergies.   Social History: The patient  reports that she quit smoking about 33 years ago. Her smoking use included Cigarettes. She started smoking about 53 years ago. She has a 35.00 pack-year smoking history. She has never used smokeless tobacco. She reports that she does not drink alcohol or use drugs.   ROS:  Please see the history of present illness. Otherwise, complete review of systems is positive for seasonal allergies.  All other systems are reviewed and negative.   Physical Exam: VS:  BP (!) 143/61   Pulse 78   Ht 5\' 8"  (1.727 m)   Wt 192 lb 12.8 oz (87.5 kg)   BMI 29.32 kg/m , BMI Body mass index is 29.32 kg/m.  Wt Readings from Last 3 Encounters:  06/26/16 192 lb 12.8 oz (87.5 kg)  12/17/15 185 lb 3.2 oz (84 kg)  11/15/15 188 lb 9.6 oz (85.5 kg)    Appears younger than stated age, no acute distress.  HEENT: Conjunctiva and lids normal, oropharynx clear with moist mucosa.  Neck: Supple, no elevated JVP or carotid bruits, no thyromegaly.  Lungs: Clear to auscultation, nonlabored breathing at rest.  Cardiac: Regular rate and rhythm, no S3 or significant systolic murmur, no pericardial rub.  Abdomen: Soft, nontender, bowel sounds present.  Extremities: No pitting edema, distal pulses 2+.   ECG: I personally  reviewed the tracing from 12/17/2015 which showed sinus rhythm with short PR interval.  Recent Labwork: August 2017: BUN 15, creatinine 0.7, potassium 4.2, AST 24, ALT 16, cholesterol 146, triglycerides 77, HDL 64, LDL 67  Other Studies Reviewed Today:  Cardiac catheterization in 08/30/2012: Coronary angiography:   Coronary dominance: Right  Left mainstem: LM normal  Left anterior descending (LAD): Moderate sized vessel. Normal throughout. Mid diagonal large and normal.  Left circumflex (LCx): AV groove normal. MOM  moderate sized and normal. PL small and normal.   Right coronary artery (RCA): Very large and dominant. Normal. PDA large and normal. PL moderate sized and normal  Left ventriculography: Left ventricular systolic function is normal, LVEF is estimated at 65%, there is no significant mitral regurgitation   Final Conclusions: Normal coronaries. NL LV function.  Cardiac CT 05/01/2014: FINDINGS: Technical quality: Suboptimal. There was a malfunction with the scanner such that the scanner was decoupled from the injector and bolus timing was significantly off (images were acquired during the venous phase rather than arterial phase of acquisition).  Heart rate: 60-62  CORONARY ARTERIES:  Evaluation of coronary artery is was nondiagnostic secondary to the limitations of the examination.  CORONARY CALCIUM: Total Agatston Score: 52  MESA database percentile: 93rd  AORTA AND PULMONARY MEASUREMENTS:  Aortic root (21 - 40 mm):  23 mm at the annulus  31 mm at the sinuses of Valsalva  27 mm at the sinotubular junction  Ascending aorta: (< 40 mm): 35 mm  Descending aorta: (< 40 mm): 28 mm  Main pulmonary artery: (< 30 mm): 29 mm  OTHER FINDINGS: The aortic valve is mildly calcified, predominantly on the left cusp near the commissure with the right cusp and near the commissure with the noncoronary cusp. Small amount of scarring in the inferior segment of the lingula. Dependent subsegmental atelectasis in the lower lobes of the lungs bilaterally. No suspicious appearing pulmonary nodules or masses are identified within the visualized thorax. Visualized portions of the upper abdomen are unremarkable. Old compression fracture of a mid thoracic vertebral body (likely T9) with approximately 15% loss of anterior vertebral body height.  IMPRESSION: 1. Despite the limitations of today's examination, there is no aortic root or ascending aorta mass  identified. 2. Mild sclerosis of the aortic valve. 3. The patient's total coronary artery calcium score is 87 which is 93rd percentile for patient's of matched age, gender and race/ethnicity. 4. Additional incidental findings, as above.  Assessment and Plan:  1. Paroxysmal atrial fibrillation, symptomatically stable on current medical regimen with infrequent episodes. CHADSVASC score is 2. She continues on Eliquis without bleeding problems. Follow-up CBC and BMET for her next visit.  2. Sick sinus syndrome status post Medtronic pacemaker placement. She continues to follow with Dr. Rayann Melton.  Current medicines were reviewed with the patient today.  Disposition: Follow-up with me in 6 months.  Signed, Satira Sark, MD, [facility] 06/26/2016 8:23 AM    [facility] [place], [place], [place] [address] Phone: [phone]; Fax: [phone]

## 2016-06-26 ENCOUNTER — Ambulatory Visit (INDEPENDENT_AMBULATORY_CARE_PROVIDER_SITE_OTHER): Payer: Medicare Other | Admitting: Cardiology

## 2016-06-26 ENCOUNTER — Encounter: Payer: Self-pay | Admitting: Cardiology

## 2016-06-26 VITALS — BP 143/61 | HR 78 | Ht 68.0 in | Wt 192.8 lb

## 2016-06-26 DIAGNOSIS — I480 Paroxysmal atrial fibrillation: Secondary | ICD-10-CM | POA: Diagnosis not present

## 2016-06-26 DIAGNOSIS — I495 Sick sinus syndrome: Secondary | ICD-10-CM | POA: Diagnosis not present

## 2016-06-26 NOTE — Patient Instructions (Signed)
Medication Instructions:  Continue all current medications.  Labwork: BMET, CBC - prior to next visit.  Testing/Procedures: none  Follow-Up: Your physician wants you to follow up in: 6 months.  You will receive a reminder letter in the mail one-two months in advance.  If you don't receive a letter, please call our office to schedule the follow up appointment   Any Other Special Instructions Will Be Listed Below (If Applicable).  If you need a refill on your cardiac medications before your next appointment, please call your pharmacy.

## 2016-07-03 DIAGNOSIS — J019 Acute sinusitis, unspecified: Secondary | ICD-10-CM | POA: Diagnosis not present

## 2016-07-03 DIAGNOSIS — Z6829 Body mass index (BMI) 29.0-29.9, adult: Secondary | ICD-10-CM | POA: Diagnosis not present

## 2016-07-03 DIAGNOSIS — J209 Acute bronchitis, unspecified: Secondary | ICD-10-CM | POA: Diagnosis not present

## 2016-07-15 DIAGNOSIS — Z23 Encounter for immunization: Secondary | ICD-10-CM | POA: Diagnosis not present

## 2016-08-18 ENCOUNTER — Encounter: Payer: Medicare Other | Admitting: Internal Medicine

## 2016-09-08 ENCOUNTER — Encounter: Payer: Medicare Other | Admitting: Internal Medicine

## 2016-11-21 DIAGNOSIS — E7800 Pure hypercholesterolemia, unspecified: Secondary | ICD-10-CM | POA: Diagnosis not present

## 2016-11-27 DIAGNOSIS — Z6829 Body mass index (BMI) 29.0-29.9, adult: Secondary | ICD-10-CM | POA: Diagnosis not present

## 2016-11-27 DIAGNOSIS — Z1389 Encounter for screening for other disorder: Secondary | ICD-10-CM | POA: Diagnosis not present

## 2016-11-27 DIAGNOSIS — I480 Paroxysmal atrial fibrillation: Secondary | ICD-10-CM | POA: Diagnosis not present

## 2016-11-29 ENCOUNTER — Encounter: Payer: Self-pay | Admitting: Cardiology

## 2016-12-12 ENCOUNTER — Telehealth: Payer: Self-pay | Admitting: Cardiology

## 2016-12-12 MED ORDER — APIXABAN 5 MG PO TABS: 5.0000 mg | ORAL_TABLET | Freq: Two times a day (BID) | ORAL | 0 refills | Status: DC

## 2016-12-12 MED ORDER — APIXABAN 5 MG PO TABS: 5.0000 mg | ORAL_TABLET | Freq: Two times a day (BID) | ORAL | 6 refills | Status: DC

## 2016-12-12 NOTE — Telephone Encounter (Signed)
Left message to return call 

## 2016-12-12 NOTE — Telephone Encounter (Signed)
Tiffany Melton called stating that she only has one week of Eliquis 5mg  left. She did not realize that she has to complete a form for the drug company each year.  She is very upset that she is about to run out.  Can we supply her with samples?   [phone].

## 2016-12-12 NOTE — Telephone Encounter (Signed)
Patient informed that we have samples available & ready for pick up for her.  Stated that she will come Monday.  Will also drop off application for assistance.  Stated that she discussed with company, not sure if she has met her 3% yet.

## 2016-12-15 ENCOUNTER — Encounter: Payer: Medicare Other | Admitting: Internal Medicine

## 2017-01-12 ENCOUNTER — Encounter: Payer: Self-pay | Admitting: *Deleted

## 2017-01-15 ENCOUNTER — Ambulatory Visit (INDEPENDENT_AMBULATORY_CARE_PROVIDER_SITE_OTHER): Payer: Medicare Other | Admitting: Cardiology

## 2017-01-15 ENCOUNTER — Encounter: Payer: Self-pay | Admitting: Cardiology

## 2017-01-15 VITALS — BP 150/90 | HR 78 | Ht 69.0 in | Wt 187.0 lb

## 2017-01-15 DIAGNOSIS — R030 Elevated blood-pressure reading, without diagnosis of hypertension: Secondary | ICD-10-CM

## 2017-01-15 DIAGNOSIS — I495 Sick sinus syndrome: Secondary | ICD-10-CM

## 2017-01-15 DIAGNOSIS — I480 Paroxysmal atrial fibrillation: Secondary | ICD-10-CM | POA: Diagnosis not present

## 2017-01-15 DIAGNOSIS — E782 Mixed hyperlipidemia: Secondary | ICD-10-CM

## 2017-01-15 NOTE — Progress Notes (Signed)
Cardiology Office Note  Date: 01/15/2017   ID: [name], DOB 1951/05/17, MRN [id number]  PCP: Rory Percy, MD  Primary Cardiologist: Rozann Lesches, MD   Chief Complaint  Patient presents with  . PAF    History of Present Illness: Tiffany Melton is a 66 y.o. female last seen in September 2017. She presents for a routine follow-up visit. Reports no significant palpitations on current medical regimen.  She continues to follow with Dr. [name] in the device clinic, Medtronic pacemaker in place. She continues on Eliquis for stroke prophylaxis, reports no bleeding problems.  I reviewed her most recent lab work per Dr. Nadara Mustard, outlined below. Lipids are well controlled on Zocor.  ECG shows sinus rhythm with PVC.  Past Medical History:  Diagnosis Date  . Abnormal stress test    GXT 9/13, cath revealed normal coronary arteries  . Paroxysmal atrial fibrillation (HCC)   . Raynaud's disease   . Sick sinus syndrome [facility])     Past Surgical History:  Procedure Laterality Date  . BACK SURGERY    . CARDIAC CATHETERIZATION  2013  . CHOLECYSTECTOMY    . PACEMAKER INSERTION  05-04-14   MDT Advisa dual chamber pacemaker implanted by Dr Caryl Comes for SSS, atrial fibrillation post termination pauses  . PERMANENT PACEMAKER INSERTION N/A 05/04/2014   Procedure: PERMANENT PACEMAKER INSERTION;  Surgeon: Deboraha Sprang, MD;  Location: [facility] CATH LAB;  Service: Cardiovascular;  Laterality: N/A;  . TONSILLECTOMY      Current Outpatient Prescriptions  Medication Sig Dispense Refill  . apixaban (ELIQUIS) 5 MG TABS tablet Take 1 tablet (5 mg total) by mouth 2 (two) times daily. 56 tablet 0  . diltiazem (CARDIZEM CD) 120 MG 24 hr capsule Take 1 capsule (120 mg total) by mouth daily. 90 capsule 3  . diltiazem (CARDIZEM) 30 MG tablet Take 30 mg by mouth as needed.    . simvastatin (ZOCOR) 10 MG tablet Take 1 tablet by mouth daily.     No current facility-administered medications for this visit.     Allergies:  Patient has no known allergies.   Social History: The patient  reports that she quit smoking about 34 years ago. Her smoking use included Cigarettes. She started smoking about 54 years ago. She has a 35.00 pack-year smoking history. She has never used smokeless tobacco. She reports that she does not drink alcohol or use drugs.   ROS:  Please see the history of present illness. Otherwise, complete review of systems is positive for none.  All other systems are reviewed and negative.   Physical Exam: VS:  BP (!) 150/90   Pulse 78   Ht 5\' 9"  (1.753 m)   Wt 187 lb (84.8 kg)   SpO2 99%   BMI 27.62 kg/m , BMI Body mass index is 27.62 kg/m.  Wt Readings from Last 3 Encounters:  01/15/17 187 lb (84.8 kg)  06/26/16 192 lb 12.8 oz (87.5 kg)  12/17/15 185 lb 3.2 oz (84 kg)    Appears younger than stated age, no acute distress.  HEENT: Conjunctiva and lids normal, oropharynx clear with moist mucosa.  Neck: Supple, no elevated JVP or carotid bruits, no thyromegaly.  Lungs: Clear to auscultation, nonlabored breathing at rest.  Cardiac: Regular rate and rhythm, no S3 or significant systolic murmur, no pericardial rub.  Abdomen: Soft, nontender, bowel sounds present.  Extremities: No pitting edema, distal pulses 2+.  ECG: I personally reviewed the tracing from 12/17/2015 which showed sinus rhythm  with short PR interval.  Recent Labwork:  February 2018: BUN 18, creatinine 0.8, potassium 4.2, AST 17, ALT 14, cholesterol 159, triglycerides 59, HDL 61, LDL 86  Other Studies Reviewed Today:  Cardiac CT 05/01/2014: FINDINGS: Technical quality: Suboptimal. There was a malfunction with the scanner such that the scanner was decoupled from the injector and bolus timing was significantly off (images were acquired during the venous phase rather than arterial phase of acquisition).  Heart rate: 60-62  CORONARY ARTERIES:  Evaluation of coronary artery is was nondiagnostic  secondary to the limitations of the examination.  CORONARY CALCIUM: Total Agatston Score: 52  MESA database percentile: 93rd  AORTA AND PULMONARY MEASUREMENTS:  Aortic root (21 - 40 mm):  23 mm at the annulus  31 mm at the sinuses of Valsalva  27 mm at the sinotubular junction  Ascending aorta: (< 40 mm): 35 mm  Descending aorta: (<40 mm): 28 mm  Main pulmonary artery: (< 30 mm): 29 mm  OTHER FINDINGS: The aortic valve is mildly calcified, predominantly on the left cusp near the commissure with the right cusp and near the commissure with the noncoronary cusp. Small amount of scarring in the inferior segment of the lingula. Dependent subsegmental atelectasis in the lower lobes of the lungs bilaterally. No suspicious appearing pulmonary nodules or masses are identified within the visualized thorax. Visualized portions of the upper abdomen are unremarkable. Old compression fracture of a mid thoracic vertebral body (likely T9) with approximately 15% loss of anterior vertebral body height.  IMPRESSION: 1. Despite the limitations of today's examination, there is no aortic root or ascending aorta mass identified. 2. Mild sclerosis of the aortic valve. 3. The patient's total coronary artery calcium score is 52 which is 93rd percentile for patient's of matched age, gender and race/ethnicity. 4. Additional incidental findings, as above.  Assessment and Plan:  1. Symptomatically stable paroxysmal atrial fibrillation with CHADSVASC score of 2. No changes made to current regimen including Eliquis. I reviewed her recent lab work.  2. Sick sinus syndrome status post Medtronic pacemaker placement. She remained stable and follows with Dr. [name] in the device clinic.  3. Elevated blood pressure. She is on Cardizem CD mainly for her PAF. Follow blood pressure trend with Dr. Nadara Mustard.  4. Hyperlipidemia, on Zocor. Recent LDL 86.  Current medicines were reviewed  with the patient today.   Orders Placed This Encounter  Procedures  . EKG 12-Lead    Disposition: Follow-up in 6 months.  Signed, Satira Sark, MD, [facility] 01/15/2017 11:39 AM    [facility] [place], [place], [place] [address] Phone: [phone]; Fax: [phone]

## 2017-01-15 NOTE — Patient Instructions (Signed)

## 2017-03-26 DIAGNOSIS — R300 Dysuria: Secondary | ICD-10-CM | POA: Diagnosis not present

## 2017-03-26 DIAGNOSIS — Z6828 Body mass index (BMI) 28.0-28.9, adult: Secondary | ICD-10-CM | POA: Diagnosis not present

## 2017-03-26 DIAGNOSIS — L568 Other specified acute skin changes due to ultraviolet radiation: Secondary | ICD-10-CM | POA: Diagnosis not present

## 2017-04-02 DIAGNOSIS — L508 Other urticaria: Secondary | ICD-10-CM | POA: Diagnosis not present

## 2017-04-02 DIAGNOSIS — Z6829 Body mass index (BMI) 29.0-29.9, adult: Secondary | ICD-10-CM | POA: Diagnosis not present

## 2017-04-04 ENCOUNTER — Other Ambulatory Visit: Payer: Self-pay | Admitting: *Deleted

## 2017-04-04 MED ORDER — DILTIAZEM HCL ER COATED BEADS 120 MG PO CP24: 120.0000 mg | ORAL_CAPSULE | Freq: Every day | ORAL | 1 refills | Status: DC

## 2017-04-13 ENCOUNTER — Ambulatory Visit (INDEPENDENT_AMBULATORY_CARE_PROVIDER_SITE_OTHER): Payer: Medicare Other | Admitting: Internal Medicine

## 2017-04-13 ENCOUNTER — Encounter: Payer: Self-pay | Admitting: Internal Medicine

## 2017-04-13 VITALS — BP 134/79 | HR 77 | Ht 68.0 in | Wt 188.0 lb

## 2017-04-13 DIAGNOSIS — I480 Paroxysmal atrial fibrillation: Secondary | ICD-10-CM | POA: Diagnosis not present

## 2017-04-13 DIAGNOSIS — I495 Sick sinus syndrome: Secondary | ICD-10-CM

## 2017-04-13 MED ORDER — APIXABAN 5 MG PO TABS: 5.0000 mg | ORAL_TABLET | Freq: Two times a day (BID) | ORAL | 0 refills | Status: DC

## 2017-04-13 NOTE — Patient Instructions (Signed)
Medication Instructions:  Continue all current medications.  Labwork: none  Testing/Procedures: none  Follow-Up: Your physician wants you to follow up in:  1 year.  You will receive a reminder letter in the mail one-two months in advance.  If you don't receive a letter, please call our office to schedule the follow up appointment.  (ALLRED)  Any Other Special Instructions Will Be Listed Below (If Applicable). Remote monitoring is used to monitor your Pacemaker of ICD from home. This monitoring reduces the number of office visits required to check your device to one time per year. It allows [place] to keep an eye on the functioning of your device to ensure it is working properly. You are scheduled for a device check from home on 07-16-2017. You may send your transmission at any time that day. If you have a wireless device, the transmission will be sent automatically. After your physician reviews your transmission, you will receive a postcard with your next transmission date.  If you need a refill on your cardiac medications before your next appointment, please call your pharmacy.

## 2017-04-13 NOTE — Addendum Note (Signed)
Addended by: Laurine Blazer on: 04/13/2017 11:21 AM   Modules accepted: Orders

## 2017-04-13 NOTE — Progress Notes (Signed)
PCP: Rory Percy, MD Primary Cardiologist:  Dr Lanier Clam Abundis is a 66 y.o. female who presents today for routine electrophysiology followup.  Since last being seen in our clinic, the patient reports doing very well.  Her afib is well controlled.  Today, she denies symptoms of palpitations, chest pain, shortness of breath,  lower extremity edema, dizziness, presyncope, or syncope.  The patient is otherwise without complaint today.   Past Medical History:  Diagnosis Date  . Abnormal stress test    GXT 9/13, cath revealed normal coronary arteries  . Paroxysmal atrial fibrillation (HCC)   . Raynaud's disease   . Sick sinus syndrome [facility])    Past Surgical History:  Procedure Laterality Date  . BACK SURGERY    . CARDIAC CATHETERIZATION  2013  . CHOLECYSTECTOMY    . PACEMAKER INSERTION  05-04-14   MDT Advisa dual chamber pacemaker implanted by Dr Caryl Comes for SSS, atrial fibrillation post termination pauses  . PERMANENT PACEMAKER INSERTION N/A 05/04/2014   Procedure: PERMANENT PACEMAKER INSERTION;  Surgeon: Deboraha Sprang, MD;  Location: [facility] CATH LAB;  Service: Cardiovascular;  Laterality: N/A;  . TONSILLECTOMY      ROS- all systems are reviewed and negative except as per HPI above  Current Outpatient Prescriptions  Medication Sig Dispense Refill  . apixaban (ELIQUIS) 5 MG TABS tablet Take 1 tablet (5 mg total) by mouth 2 (two) times daily. 56 tablet 0  . diltiazem (CARDIZEM CD) 120 MG 24 hr capsule Take 1 capsule (120 mg total) by mouth daily. 90 capsule 1  . diltiazem (CARDIZEM) 30 MG tablet Take 30 mg by mouth as needed.    . predniSONE (STERAPRED UNI-PAK 48 TAB) 10 MG (48) TBPK tablet     . simvastatin (ZOCOR) 10 MG tablet Take 1 tablet by mouth daily.     No current facility-administered medications for this visit.     Physical Exam: Vitals:   04/13/17 0847  BP: 134/79  Pulse: 77  SpO2: 99%  Weight: 188 lb (85.3 kg)  Height: 5\' 8"  (1.727 m)    GEN- The patient is  well appearing, alert and oriented x 3 today.   Head- normocephalic, atraumatic Eyes-  Sclera clear, conjunctiva pink Ears- hearing intact Oropharynx- clear Lungs- Clear to ausculation bilaterally, normal work of breathing Chest- pacemaker pocket is well healed Heart- Regular rate and rhythm, no murmurs, rubs or gallops, PMI not laterally displaced GI- soft, NT, ND, + BS Extremities- no clubbing, cyanosis, or edema  Pacemaker interrogation- reviewed in detail today,  See PACEART report  Assessment and Plan:  1. 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 Paroxysmal atrial fibrillation chads2vasc score is 2.  Doing well with eliquis afib burden is 1.2% (previously 0.8% 2017, 0.5% 2016) though she is very pleased with this low burden  3. Overweight Body mass index is 28.59 kg/m. Lifestyle modification is encouraged   She will follow-up with Dr Domenic Polite for AF management carelink every 3 months I will see again in 1 year  Thompson Grayer MD, [facility] 04/13/2017 9:37 AM

## 2017-04-17 LAB — CUP PACEART INCLINIC DEVICE CHECK
Battery Remaining Longevity: 87 mo
Brady Statistic AP VP Percent: 0.18 %
Brady Statistic AP VS Percent: 71.61 %
Brady Statistic AS VP Percent: 0.05 %
Brady Statistic AS VS Percent: 28.16 %
Brady Statistic RA Percent Paced: 68.81 %
Implantable Lead Implant Date: 20150727
Implantable Lead Model: 5076
Lead Channel Impedance Value: 342 Ohm
Lead Channel Impedance Value: 399 Ohm
Lead Channel Impedance Value: 418 Ohm
Lead Channel Impedance Value: 608 Ohm
Lead Channel Pacing Threshold Amplitude: 1.75 V
Lead Channel Pacing Threshold Pulse Width: 0.4 ms
Lead Channel Sensing Intrinsic Amplitude: 3 mV
Lead Channel Sensing Intrinsic Amplitude: 3 mV
Lead Channel Sensing Intrinsic Amplitude: 4.25 mV
Lead Channel Sensing Intrinsic Amplitude: 4.875 mV
Lead Channel Setting Pacing Amplitude: 2 V
Lead Channel Setting Pacing Pulse Width: 0.4 ms
MDC IDC LEAD IMPLANT DT: 20150727
MDC IDC LEAD LOCATION: 753859
MDC IDC LEAD LOCATION: 753860
MDC IDC MSMT BATTERY VOLTAGE: 3.01 V
MDC IDC MSMT LEADCHNL RA PACING THRESHOLD AMPLITUDE: 0.75 V
MDC IDC MSMT LEADCHNL RA PACING THRESHOLD PULSEWIDTH: 0.4 ms
MDC IDC PG IMPLANT DT: 20150727
MDC IDC SESS DTM: 20180706125230
MDC IDC SET LEADCHNL RV PACING AMPLITUDE: 3.5 V
MDC IDC SET LEADCHNL RV SENSING SENSITIVITY: 2 mV
MDC IDC STAT BRADY RV PERCENT PACED: 0.24 %

## 2017-05-21 DIAGNOSIS — Z1231 Encounter for screening mammogram for malignant neoplasm of breast: Secondary | ICD-10-CM | POA: Diagnosis not present

## 2017-05-28 ENCOUNTER — Other Ambulatory Visit: Payer: Self-pay | Admitting: Internal Medicine

## 2017-05-28 MED ORDER — SIMVASTATIN 10 MG PO TABS: 10.0000 mg | ORAL_TABLET | Freq: Every day | ORAL | 0 refills | Status: DC

## 2017-05-28 MED ORDER — APIXABAN 5 MG PO TABS: 5.0000 mg | ORAL_TABLET | Freq: Two times a day (BID) | ORAL | 1 refills | Status: DC

## 2017-05-28 MED ORDER — DILTIAZEM HCL 30 MG PO TABS: 30.0000 mg | ORAL_TABLET | ORAL | 0 refills | Status: DC | PRN

## 2017-05-28 MED ORDER — DILTIAZEM HCL ER COATED BEADS 120 MG PO CP24: 120.0000 mg | ORAL_CAPSULE | Freq: Every day | ORAL | 0 refills | Status: DC

## 2017-05-28 MED ORDER — SIMVASTATIN 10 MG PO TABS
10.0000 mg | ORAL_TABLET | Freq: Every day | ORAL | 0 refills | Status: DC
Start: 2017-05-28 — End: 2017-05-28

## 2017-05-28 NOTE — Telephone Encounter (Signed)
Prescriptions sent

## 2017-05-28 NOTE — Telephone Encounter (Signed)
Please send all prescriptions to [facility] apixaban (ELIQUIS) 5 MG TABS tablet diltiazem (CARDIZEM CD) 120 MG 24 hr capsule   diltiazem (CARDIZEM) 30 MG tablet   predniSONE (STERAPRED UNI-PAK 48 TAB) 10 MG (48) TBPK tablet   simvastatin (ZOCOR) 10 MG tablet

## 2017-07-16 ENCOUNTER — Ambulatory Visit (INDEPENDENT_AMBULATORY_CARE_PROVIDER_SITE_OTHER): Payer: Medicare Other | Admitting: *Deleted

## 2017-07-16 DIAGNOSIS — I495 Sick sinus syndrome: Secondary | ICD-10-CM

## 2017-07-17 NOTE — Progress Notes (Signed)
Remote pacemaker transmission.   

## 2017-07-18 ENCOUNTER — Other Ambulatory Visit: Payer: Self-pay | Admitting: *Deleted

## 2017-07-18 LAB — CUP PACEART REMOTE DEVICE CHECK
Battery Remaining Longevity: 85 mo
Brady Statistic AP VP Percent: 0.19 %
Brady Statistic AP VS Percent: 76.35 %
Brady Statistic AS VP Percent: 0.04 %
Brady Statistic AS VS Percent: 23.42 %
Brady Statistic RV Percent Paced: 0.24 %
Date Time Interrogation Session: 20181008161629
Implantable Lead Implant Date: 20150727
Implantable Lead Location: 753859
Implantable Lead Model: 5076
Lead Channel Impedance Value: 323 Ohm
Lead Channel Impedance Value: 570 Ohm
Lead Channel Pacing Threshold Amplitude: 2.25 V
Lead Channel Sensing Intrinsic Amplitude: 3.125 mV
Lead Channel Sensing Intrinsic Amplitude: 3.125 mV
Lead Channel Sensing Intrinsic Amplitude: 4.125 mV
Lead Channel Sensing Intrinsic Amplitude: 4.125 mV
Lead Channel Setting Pacing Amplitude: 3.5 V
Lead Channel Setting Pacing Pulse Width: 0.4 ms
MDC IDC LEAD IMPLANT DT: 20150727
MDC IDC LEAD LOCATION: 753860
MDC IDC MSMT BATTERY VOLTAGE: 3.01 V
MDC IDC MSMT LEADCHNL RA IMPEDANCE VALUE: 418 Ohm
MDC IDC MSMT LEADCHNL RA PACING THRESHOLD AMPLITUDE: 0.625 V
MDC IDC MSMT LEADCHNL RA PACING THRESHOLD PULSEWIDTH: 0.4 ms
MDC IDC MSMT LEADCHNL RV IMPEDANCE VALUE: 380 Ohm
MDC IDC MSMT LEADCHNL RV PACING THRESHOLD PULSEWIDTH: 0.4 ms
MDC IDC PG IMPLANT DT: 20150727
MDC IDC SET LEADCHNL RA PACING AMPLITUDE: 2 V
MDC IDC SET LEADCHNL RV SENSING SENSITIVITY: 2 mV
MDC IDC STAT BRADY RA PERCENT PACED: 72.88 %

## 2017-07-18 MED ORDER — APIXABAN 5 MG PO TABS: 5.0000 mg | ORAL_TABLET | Freq: Two times a day (BID) | ORAL | 3 refills | Status: DC

## 2017-07-20 ENCOUNTER — Encounter: Payer: Self-pay | Admitting: Cardiology

## 2017-07-30 ENCOUNTER — Ambulatory Visit: Payer: Medicare Other | Admitting: Cardiology

## 2017-08-03 ENCOUNTER — Encounter: Payer: Self-pay | Admitting: Cardiology

## 2017-08-27 NOTE — Progress Notes (Signed)
Cardiology Office Note  Date: 08/28/2017   ID: Tiffany Melton, DOB 1950-10-16, MRN [id number]  PCP: Rory Percy, MD  Primary Cardiologist: Rozann Lesches, MD   Chief Complaint  Patient presents with  . PAF    History of Present Illness: Tiffany Melton is a 65 y.o. female last seen in April.  She presents for a routine follow-up visit.  Reports no chest pain or increasing shortness of breath.  She has occasional palpitations, less bothered by them.  She has used short acting Cardizem only a few times.  She continues to follow in the device clinic with Dr. [name], Medtronic pacemaker in place.  She states that she missed her routine physical and lab work with Dr. Nadara Mustard but plans to do this in January.  She does not report any bleeding problems on Eliquis.  Past Medical History:  Diagnosis Date  . Abnormal stress test    GXT 9/13, cath revealed normal coronary arteries  . Paroxysmal atrial fibrillation (HCC)   . Raynaud's disease   . Sick sinus syndrome [facility])     Past Surgical History:  Procedure Laterality Date  . BACK SURGERY    . CARDIAC CATHETERIZATION  2013  . CHOLECYSTECTOMY    . PACEMAKER INSERTION  05-04-14   MDT Advisa dual chamber pacemaker implanted by Dr Caryl Comes for SSS, atrial fibrillation post termination pauses  . PERMANENT PACEMAKER INSERTION N/A 05/04/2014   Procedure: PERMANENT PACEMAKER INSERTION;  Surgeon: Deboraha Sprang, MD;  Location: [facility] CATH LAB;  Service: Cardiovascular;  Laterality: N/A;  . TONSILLECTOMY      Current Outpatient Medications  Medication Sig Dispense Refill  . apixaban (ELIQUIS) 5 MG TABS tablet Take 1 tablet (5 mg total) by mouth 2 (two) times daily. 180 tablet 3  . diltiazem (CARDIZEM CD) 120 MG 24 hr capsule Take 1 capsule (120 mg total) by mouth daily. 90 capsule 0  . diltiazem (CARDIZEM) 30 MG tablet Take 1 tablet (30 mg total) by mouth as needed. 90 tablet 0  . simvastatin (ZOCOR) 10 MG tablet Take 1 tablet (10 mg total) by  mouth daily. 90 tablet 0   No current facility-administered medications for this visit.    Allergies:  Patient has no known allergies.   Social History: The patient  reports that she quit smoking about 35 years ago. Her smoking use included cigarettes. She started smoking about 54 years ago. She has a 35.00 pack-year smoking history. she has never used smokeless tobacco. She reports that she does not drink alcohol or use drugs.   ROS:  Please see the history of present illness. Otherwise, complete review of systems is positive for none.  All other systems are reviewed and negative.   Physical Exam: VS:  BP 118/88   Pulse 68   Ht 5\' 9"  (1.753 m)   Wt 187 lb 9.6 oz (85.1 kg)   SpO2 98%   BMI 27.70 kg/m , BMI Body mass index is 27.7 kg/m.  Wt Readings from Last 3 Encounters:  08/28/17 187 lb 9.6 oz (85.1 kg)  04/13/17 188 lb (85.3 kg)  01/15/17 187 lb (84.8 kg)    General: Patient appears comfortable at rest. HEENT: Conjunctiva and lids normal, oropharynx clear. Neck: Supple, no elevated JVP or carotid bruits, no thyromegaly. Lungs: Clear to auscultation, nonlabored breathing at rest. Cardiac: Regular rate and rhythm, no S3 or significant systolic murmur, no pericardial rub. Abdomen: Soft, nontender, bowel sounds present, no guarding or rebound. Extremities: No pitting  edema, distal pulses 2+. Skin: Warm and dry.   ECG: I personally reviewed the tracing from 01/15/2017 which showed sinus rhythm with PVC.  Recent Labwork:  February 2018: BUN 18, creatinine 0.81, potassium 4.2, AST 17, ALT 14, cholesterol 159, triglycerides 59, HDL 61, LDL 86  Assessment and Plan:  1.  Paroxysmal atrial fibrillation with CHADSVASC score of 2.  She continues on Eliquis and has adequate control of intermittent palpitations on current dose of Cardizem.  She will have follow-up lab work with Dr. Nadara Mustard in January.  2.  Sick sinus syndrome status post Medtronic pacemaker placement.  She continues to  follow with Dr. [name].  Current medicines were reviewed with the patient today.   Disposition: Follow-up in 6 months.  Signed, Satira Sark, MD, [facility] 08/28/2017 2:32 PM    [facility] [place], [place], [place] [address] Phone: [phone]; Fax: [phone]

## 2017-08-28 ENCOUNTER — Ambulatory Visit (INDEPENDENT_AMBULATORY_CARE_PROVIDER_SITE_OTHER): Payer: Medicare Other | Admitting: Cardiology

## 2017-08-28 ENCOUNTER — Encounter: Payer: Self-pay | Admitting: Cardiology

## 2017-08-28 VITALS — BP 118/88 | HR 68 | Ht 69.0 in | Wt 187.6 lb

## 2017-08-28 DIAGNOSIS — I480 Paroxysmal atrial fibrillation: Secondary | ICD-10-CM | POA: Diagnosis not present

## 2017-08-28 DIAGNOSIS — I495 Sick sinus syndrome: Secondary | ICD-10-CM | POA: Diagnosis not present

## 2017-08-28 MED ORDER — APIXABAN 5 MG PO TABS: 5.0000 mg | ORAL_TABLET | Freq: Two times a day (BID) | ORAL | 0 refills | Status: DC

## 2017-08-28 NOTE — Patient Instructions (Signed)

## 2017-10-08 ENCOUNTER — Other Ambulatory Visit: Payer: Self-pay | Admitting: *Deleted

## 2017-10-08 DIAGNOSIS — I480 Paroxysmal atrial fibrillation: Secondary | ICD-10-CM | POA: Diagnosis not present

## 2017-10-08 DIAGNOSIS — E7800 Pure hypercholesterolemia, unspecified: Secondary | ICD-10-CM | POA: Diagnosis not present

## 2017-10-08 MED ORDER — DILTIAZEM HCL ER COATED BEADS 120 MG PO CP24: 120.0000 mg | ORAL_CAPSULE | Freq: Every day | ORAL | 3 refills | Status: DC

## 2017-10-15 ENCOUNTER — Ambulatory Visit (INDEPENDENT_AMBULATORY_CARE_PROVIDER_SITE_OTHER): Payer: Medicare Other | Admitting: *Deleted

## 2017-10-15 DIAGNOSIS — I495 Sick sinus syndrome: Secondary | ICD-10-CM | POA: Diagnosis not present

## 2017-10-16 ENCOUNTER — Encounter: Payer: Self-pay | Admitting: Cardiology

## 2017-10-16 DIAGNOSIS — I480 Paroxysmal atrial fibrillation: Secondary | ICD-10-CM | POA: Diagnosis not present

## 2017-10-16 DIAGNOSIS — Z6829 Body mass index (BMI) 29.0-29.9, adult: Secondary | ICD-10-CM | POA: Diagnosis not present

## 2017-10-16 DIAGNOSIS — Z1389 Encounter for screening for other disorder: Secondary | ICD-10-CM | POA: Diagnosis not present

## 2017-10-16 DIAGNOSIS — Z0000 Encounter for general adult medical examination without abnormal findings: Secondary | ICD-10-CM | POA: Diagnosis not present

## 2017-10-16 LAB — CUP PACEART REMOTE DEVICE CHECK
Battery Voltage: 3.01 V
Brady Statistic AP VP Percent: 0.33 %
Brady Statistic AS VP Percent: 0.06 %
Brady Statistic AS VS Percent: 24.91 %
Brady Statistic RV Percent Paced: 0.42 %
Implantable Lead Implant Date: 20150727
Implantable Lead Location: 753859
Implantable Lead Model: 5076
Implantable Pulse Generator Implant Date: 20150727
Lead Channel Impedance Value: 323 Ohm
Lead Channel Impedance Value: 380 Ohm
Lead Channel Impedance Value: 418 Ohm
Lead Channel Impedance Value: 589 Ohm
Lead Channel Pacing Threshold Amplitude: 0.625 V
Lead Channel Pacing Threshold Amplitude: 1.625 V
Lead Channel Pacing Threshold Pulse Width: 0.4 ms
Lead Channel Sensing Intrinsic Amplitude: 2.875 mV
Lead Channel Sensing Intrinsic Amplitude: 2.875 mV
Lead Channel Setting Pacing Amplitude: 2 V
Lead Channel Setting Pacing Amplitude: 3.5 V
Lead Channel Setting Pacing Pulse Width: 0.4 ms
Lead Channel Setting Sensing Sensitivity: 2 mV
MDC IDC LEAD IMPLANT DT: 20150727
MDC IDC LEAD LOCATION: 753860
MDC IDC MSMT BATTERY REMAINING LONGEVITY: 84 mo
MDC IDC MSMT LEADCHNL RA PACING THRESHOLD PULSEWIDTH: 0.4 ms
MDC IDC MSMT LEADCHNL RA SENSING INTR AMPL: 2 mV
MDC IDC MSMT LEADCHNL RA SENSING INTR AMPL: 2 mV
MDC IDC SESS DTM: 20190107140557
MDC IDC STAT BRADY AP VS PERCENT: 74.69 %
MDC IDC STAT BRADY RA PERCENT PACED: 70.54 %

## 2017-10-16 NOTE — Progress Notes (Signed)
Remote pacemaker transmission.   

## 2018-01-08 DIAGNOSIS — M2012 Hallux valgus (acquired), left foot: Secondary | ICD-10-CM | POA: Diagnosis not present

## 2018-01-08 DIAGNOSIS — M2042 Other hammer toe(s) (acquired), left foot: Secondary | ICD-10-CM | POA: Diagnosis not present

## 2018-01-14 ENCOUNTER — Ambulatory Visit (INDEPENDENT_AMBULATORY_CARE_PROVIDER_SITE_OTHER): Payer: Medicare Other | Admitting: *Deleted

## 2018-01-14 DIAGNOSIS — I495 Sick sinus syndrome: Secondary | ICD-10-CM

## 2018-01-15 NOTE — Progress Notes (Signed)
Remote pacemaker transmission.   

## 2018-01-17 ENCOUNTER — Encounter: Payer: Self-pay | Admitting: Cardiology

## 2018-01-17 NOTE — Progress Notes (Signed)
Letter  

## 2018-01-29 LAB — CUP PACEART REMOTE DEVICE CHECK
Battery Remaining Longevity: 74 mo
Battery Voltage: 3.01 V
Brady Statistic AP VS Percent: 75.33 %
Brady Statistic RV Percent Paced: 0.29 %
Date Time Interrogation Session: 20190408140433
Implantable Lead Implant Date: 20150727
Implantable Lead Location: 753859
Implantable Lead Location: 753860
Implantable Lead Model: 5076
Implantable Pulse Generator Implant Date: 20150727
Lead Channel Impedance Value: 380 Ohm
Lead Channel Impedance Value: 608 Ohm
Lead Channel Pacing Threshold Amplitude: 0.625 V
Lead Channel Pacing Threshold Pulse Width: 0.4 ms
Lead Channel Sensing Intrinsic Amplitude: 3.625 mV
Lead Channel Sensing Intrinsic Amplitude: 3.625 mV
Lead Channel Sensing Intrinsic Amplitude: 4.5 mV
Lead Channel Setting Sensing Sensitivity: 2 mV
MDC IDC LEAD IMPLANT DT: 20150727
MDC IDC MSMT LEADCHNL RA IMPEDANCE VALUE: 437 Ohm
MDC IDC MSMT LEADCHNL RV IMPEDANCE VALUE: 342 Ohm
MDC IDC MSMT LEADCHNL RV PACING THRESHOLD AMPLITUDE: 1.625 V
MDC IDC MSMT LEADCHNL RV PACING THRESHOLD PULSEWIDTH: 0.4 ms
MDC IDC MSMT LEADCHNL RV SENSING INTR AMPL: 4.5 mV
MDC IDC SET LEADCHNL RA PACING AMPLITUDE: 2 V
MDC IDC SET LEADCHNL RV PACING AMPLITUDE: 3.5 V
MDC IDC SET LEADCHNL RV PACING PULSEWIDTH: 0.4 ms
MDC IDC STAT BRADY AP VP PERCENT: 0.21 %
MDC IDC STAT BRADY AS VP PERCENT: 0.06 %
MDC IDC STAT BRADY AS VS PERCENT: 24.39 %
MDC IDC STAT BRADY RA PERCENT PACED: 71.47 %

## 2018-02-26 ENCOUNTER — Telehealth: Payer: Self-pay

## 2018-02-26 DIAGNOSIS — M79671 Pain in right foot: Secondary | ICD-10-CM | POA: Diagnosis not present

## 2018-02-26 DIAGNOSIS — M2041 Other hammer toe(s) (acquired), right foot: Secondary | ICD-10-CM | POA: Diagnosis not present

## 2018-02-26 DIAGNOSIS — M2011 Hallux valgus (acquired), right foot: Secondary | ICD-10-CM | POA: Diagnosis not present

## 2018-02-26 NOTE — Telephone Encounter (Signed)
Dr. Irving Shows contacted office stating patient is going to have to have surgery on her R foot in June. Dr. Irving Shows would like to know how long Dr. Domenic Polite recommends patient coming off eliquis prior to surgery and after surgery

## 2018-02-26 NOTE — Telephone Encounter (Signed)
Dr. Irving Shows notified and verbalized understanding.

## 2018-02-26 NOTE — Telephone Encounter (Signed)
Depending on the extent of surgery and anticipated bleeding risk, would generally recommend holding Eliquis 24-48 hours prior to the procedure.  This could be resumed approximately 48 hours after surgery in the setting of adequate hemostasis.

## 2018-03-12 DIAGNOSIS — Z87891 Personal history of nicotine dependence: Secondary | ICD-10-CM | POA: Diagnosis not present

## 2018-03-12 DIAGNOSIS — Z7901 Long term (current) use of anticoagulants: Secondary | ICD-10-CM | POA: Diagnosis not present

## 2018-03-12 DIAGNOSIS — M2011 Hallux valgus (acquired), right foot: Secondary | ICD-10-CM | POA: Diagnosis not present

## 2018-03-12 DIAGNOSIS — E7800 Pure hypercholesterolemia, unspecified: Secondary | ICD-10-CM | POA: Diagnosis not present

## 2018-03-12 DIAGNOSIS — Z950 Presence of cardiac pacemaker: Secondary | ICD-10-CM | POA: Diagnosis not present

## 2018-03-12 DIAGNOSIS — M2041 Other hammer toe(s) (acquired), right foot: Secondary | ICD-10-CM | POA: Diagnosis not present

## 2018-03-12 DIAGNOSIS — Z79899 Other long term (current) drug therapy: Secondary | ICD-10-CM | POA: Diagnosis not present

## 2018-03-12 DIAGNOSIS — I4891 Unspecified atrial fibrillation: Secondary | ICD-10-CM | POA: Diagnosis not present

## 2018-03-14 DIAGNOSIS — E7800 Pure hypercholesterolemia, unspecified: Secondary | ICD-10-CM | POA: Diagnosis not present

## 2018-03-14 DIAGNOSIS — I4891 Unspecified atrial fibrillation: Secondary | ICD-10-CM | POA: Diagnosis not present

## 2018-03-14 DIAGNOSIS — Z950 Presence of cardiac pacemaker: Secondary | ICD-10-CM | POA: Diagnosis not present

## 2018-03-14 DIAGNOSIS — M2041 Other hammer toe(s) (acquired), right foot: Secondary | ICD-10-CM | POA: Diagnosis not present

## 2018-03-14 DIAGNOSIS — Z7901 Long term (current) use of anticoagulants: Secondary | ICD-10-CM | POA: Diagnosis not present

## 2018-03-14 DIAGNOSIS — M2011 Hallux valgus (acquired), right foot: Secondary | ICD-10-CM | POA: Diagnosis not present

## 2018-03-26 DIAGNOSIS — M2011 Hallux valgus (acquired), right foot: Secondary | ICD-10-CM | POA: Diagnosis not present

## 2018-03-26 DIAGNOSIS — M79671 Pain in right foot: Secondary | ICD-10-CM | POA: Diagnosis not present

## 2018-04-10 ENCOUNTER — Encounter: Payer: Self-pay | Admitting: *Deleted

## 2018-04-12 ENCOUNTER — Ambulatory Visit (INDEPENDENT_AMBULATORY_CARE_PROVIDER_SITE_OTHER): Payer: Medicare Other | Admitting: Internal Medicine

## 2018-04-12 ENCOUNTER — Other Ambulatory Visit: Payer: Self-pay

## 2018-04-12 ENCOUNTER — Encounter: Payer: Self-pay | Admitting: *Deleted

## 2018-04-12 VITALS — BP 118/70 | HR 88 | Ht 68.0 in | Wt 180.0 lb

## 2018-04-12 DIAGNOSIS — I495 Sick sinus syndrome: Secondary | ICD-10-CM | POA: Diagnosis not present

## 2018-04-12 DIAGNOSIS — I480 Paroxysmal atrial fibrillation: Secondary | ICD-10-CM | POA: Diagnosis not present

## 2018-04-12 LAB — CUP PACEART INCLINIC DEVICE CHECK
Date Time Interrogation Session: 20190705125750
Implantable Lead Implant Date: 20150727
Implantable Lead Location: 753859
Implantable Lead Location: 753860
Implantable Pulse Generator Implant Date: 20150727
Lead Channel Impedance Value: 380 Ohm
Lead Channel Pacing Threshold Amplitude: 0.75 V
Lead Channel Pacing Threshold Amplitude: 2.25 V
Lead Channel Sensing Intrinsic Amplitude: 6.1 mV
Lead Channel Setting Pacing Pulse Width: 0.4 ms
Lead Channel Setting Sensing Sensitivity: 2 mV
MDC IDC LEAD IMPLANT DT: 20150727
MDC IDC MSMT LEADCHNL RA IMPEDANCE VALUE: 570 Ohm
MDC IDC MSMT LEADCHNL RA PACING THRESHOLD PULSEWIDTH: 0.4 ms
MDC IDC MSMT LEADCHNL RA SENSING INTR AMPL: 2.5 mV
MDC IDC MSMT LEADCHNL RV PACING THRESHOLD PULSEWIDTH: 0.4 ms
MDC IDC SET LEADCHNL RA PACING AMPLITUDE: 2 V
MDC IDC SET LEADCHNL RV PACING AMPLITUDE: 3.5 V

## 2018-04-12 MED ORDER — APIXABAN 5 MG PO TABS: 5.0000 mg | ORAL_TABLET | Freq: Two times a day (BID) | ORAL | 0 refills | Status: DC

## 2018-04-12 NOTE — Progress Notes (Signed)
PCP: Rory Percy, MD Primary Cardiologist: Dr Domenic Polite Primary EP:  Dr Laurance Flatten is a 67 y.o. female who presents today for routine electrophysiology followup.  Since last being seen in our clinic, the patient reports doing very well.  Today, she denies symptoms of palpitations, chest pain, shortness of breath,  lower extremity edema, dizziness, presyncope, or syncope.  S/p recent L foot surgery.  The patient is otherwise without complaint today.   Past Medical History:  Diagnosis Date  . Abnormal stress test    GXT 9/13, cath revealed normal coronary arteries  . Paroxysmal atrial fibrillation (HCC)   . Raynaud's disease   . Sick sinus syndrome [facility])    Past Surgical History:  Procedure Laterality Date  . BACK SURGERY    . CARDIAC CATHETERIZATION  2013  . CHOLECYSTECTOMY    . PACEMAKER INSERTION  05-04-14   MDT Advisa dual chamber pacemaker implanted by Dr Caryl Comes for SSS, atrial fibrillation post termination pauses  . PERMANENT PACEMAKER INSERTION N/A 05/04/2014   Procedure: PERMANENT PACEMAKER INSERTION;  Surgeon: Deboraha Sprang, MD;  Location: [facility] CATH LAB;  Service: Cardiovascular;  Laterality: N/A;  . TONSILLECTOMY      ROS- all systems are reviewed and negative except as per HPI above  Current Outpatient Medications  Medication Sig Dispense Refill  . apixaban (ELIQUIS) 5 MG TABS tablet Take 1 tablet (5 mg total) by mouth 2 (two) times daily. 56 tablet 0  . diltiazem (CARDIZEM CD) 120 MG 24 hr capsule Take 1 capsule (120 mg total) by mouth daily. 90 capsule 3  . diltiazem (CARDIZEM) 30 MG tablet Take 1 tablet (30 mg total) by mouth as needed. 90 tablet 0  . simvastatin (ZOCOR) 10 MG tablet Take 1 tablet (10 mg total) by mouth daily. 90 tablet 0   No current facility-administered medications for this visit.     Physical Exam: Vitals:   04/12/18 0848  BP: 118/70  Pulse: 88  SpO2: 97%  Weight: 180 lb (81.6 kg)  Height: 5\' 8"  (1.727 m)    GEN- The  patient is well appearing, alert and oriented x 3 today.   Head- normocephalic, atraumatic Eyes-  Sclera clear, conjunctiva pink Ears- hearing intact Oropharynx- clear Lungs- Clear to ausculation bilaterally, normal work of breathing Chest- pacemaker pocket is well healed Heart- Regular rate and rhythm, no murmurs, rubs or gallops, PMI not laterally displaced GI- soft, NT, ND, + BS Extremities- no clubbing, cyanosis, or edema, L foot in a boot  Pacemaker interrogation- reviewed in detail today,  See PACEART report   Assessment and Plan:  1. Symptomatic sinus bradycardia  Normal pacemaker function See Pace Art report No changes today RV threshold is chronically elevated.  Does not RV pace  2. Paroxysmal atrial fibrillation afib burden is 0.9 % (1.2% 2018, 0.8% 2017) chads2vasc score is 2.  On eliquis No changes today  3. Overweight Body mass index is 27.37 kg/m. Wt Readings from Last 3 Encounters:  04/12/18 180 lb (81.6 kg)  08/28/17 187 lb 9.6 oz (85.1 kg)  04/13/17 188 lb (85.3 kg)   Very pleased with weight loss!  4. Snoring/ fatigue During her recent foot surgery, she says that she was told that she had sleep apnea.  I have advised that she discuss having a sleep study with Dr Nadara Mustard.  She is reluctant to do so.  We discussed the importance of treatment of sleep apnea at length today.  [facility] Return in a  year  Thompson Grayer MD, [facility] 04/12/2018 8:54 AM

## 2018-04-12 NOTE — Patient Instructions (Signed)
Medication Instructions:  Continue all current medications.  Labwork: none  Testing/Procedures: none  Follow-Up: Your physician wants you to follow up in:  1 year.  You will receive a reminder letter in the mail one-two months in advance.  If you don't receive a letter, please call our office to schedule the follow up appointment   Any Other Special Instructions Will Be Listed Below (If Applicable). Remote monitoring is used to monitor your Pacemaker of ICD from home. This monitoring reduces the number of office visits required to check your device to one time per year. It allows us to keep an eye on the functioning of your device to ensure it is working properly. You are scheduled for a device check from home on 04/15/2018. You may send your transmission at any time that day. If you have a wireless device, the transmission will be sent automatically. After your physician reviews your transmission, you will receive a postcard with your next transmission date.  If you need a refill on your cardiac medications before your next appointment, please call your pharmacy.  

## 2018-04-15 ENCOUNTER — Ambulatory Visit (INDEPENDENT_AMBULATORY_CARE_PROVIDER_SITE_OTHER): Payer: Medicare Other | Admitting: *Deleted

## 2018-04-15 ENCOUNTER — Telehealth: Payer: Self-pay | Admitting: Cardiology

## 2018-04-15 DIAGNOSIS — I495 Sick sinus syndrome: Secondary | ICD-10-CM | POA: Diagnosis not present

## 2018-04-15 NOTE — Telephone Encounter (Signed)
Spoke with pt and reminded pt of remote transmission that is due today. Pt verbalized understanding.   

## 2018-04-16 DIAGNOSIS — M79671 Pain in right foot: Secondary | ICD-10-CM | POA: Diagnosis not present

## 2018-04-16 DIAGNOSIS — M2011 Hallux valgus (acquired), right foot: Secondary | ICD-10-CM | POA: Diagnosis not present

## 2018-04-16 NOTE — Progress Notes (Signed)
Remote pacemaker transmission.   

## 2018-04-19 LAB — CUP PACEART REMOTE DEVICE CHECK
Battery Remaining Longevity: 73 mo
Battery Voltage: 3.01 V
Brady Statistic RV Percent Paced: 0.21 %
Date Time Interrogation Session: 20190708180129
Implantable Lead Implant Date: 20150727
Implantable Lead Location: 753860
Implantable Lead Model: 5076
Implantable Pulse Generator Implant Date: 20150727
Lead Channel Impedance Value: 380 Ohm
Lead Channel Impedance Value: 589 Ohm
Lead Channel Pacing Threshold Amplitude: 0.625 V
Lead Channel Pacing Threshold Amplitude: 1.5 V
Lead Channel Pacing Threshold Pulse Width: 0.4 ms
Lead Channel Sensing Intrinsic Amplitude: 3 mV
Lead Channel Setting Sensing Sensitivity: 2 mV
MDC IDC LEAD IMPLANT DT: 20150727
MDC IDC LEAD LOCATION: 753859
MDC IDC MSMT LEADCHNL RA IMPEDANCE VALUE: 437 Ohm
MDC IDC MSMT LEADCHNL RA SENSING INTR AMPL: 3 mV
MDC IDC MSMT LEADCHNL RV IMPEDANCE VALUE: 323 Ohm
MDC IDC MSMT LEADCHNL RV PACING THRESHOLD PULSEWIDTH: 0.4 ms
MDC IDC MSMT LEADCHNL RV SENSING INTR AMPL: 4.125 mV
MDC IDC MSMT LEADCHNL RV SENSING INTR AMPL: 4.125 mV
MDC IDC SET LEADCHNL RA PACING AMPLITUDE: 2 V
MDC IDC SET LEADCHNL RV PACING AMPLITUDE: 3.5 V
MDC IDC SET LEADCHNL RV PACING PULSEWIDTH: 0.4 ms
MDC IDC STAT BRADY AP VP PERCENT: 0.18 %
MDC IDC STAT BRADY AP VS PERCENT: 80.89 %
MDC IDC STAT BRADY AS VP PERCENT: 0.01 %
MDC IDC STAT BRADY AS VS PERCENT: 18.92 %
MDC IDC STAT BRADY RA PERCENT PACED: 78.08 %

## 2018-04-30 DIAGNOSIS — M2011 Hallux valgus (acquired), right foot: Secondary | ICD-10-CM | POA: Diagnosis not present

## 2018-06-03 DIAGNOSIS — M940 Chondrocostal junction syndrome [Tietze]: Secondary | ICD-10-CM | POA: Diagnosis not present

## 2018-06-03 DIAGNOSIS — Z6827 Body mass index (BMI) 27.0-27.9, adult: Secondary | ICD-10-CM | POA: Diagnosis not present

## 2018-07-13 ENCOUNTER — Other Ambulatory Visit: Payer: Self-pay | Admitting: Cardiology

## 2018-07-15 ENCOUNTER — Ambulatory Visit (INDEPENDENT_AMBULATORY_CARE_PROVIDER_SITE_OTHER): Payer: Medicare Other | Admitting: *Deleted

## 2018-07-15 ENCOUNTER — Telehealth: Payer: Self-pay | Admitting: Cardiology

## 2018-07-15 DIAGNOSIS — I495 Sick sinus syndrome: Secondary | ICD-10-CM | POA: Diagnosis not present

## 2018-07-15 NOTE — Telephone Encounter (Signed)
Spoke with pt and reminded pt of remote transmission that is due today. Pt verbalized understanding.   

## 2018-07-16 DIAGNOSIS — Z1231 Encounter for screening mammogram for malignant neoplasm of breast: Secondary | ICD-10-CM | POA: Diagnosis not present

## 2018-07-16 NOTE — Progress Notes (Signed)
Remote pacemaker transmission.   

## 2018-07-24 ENCOUNTER — Encounter: Payer: Self-pay | Admitting: Cardiology

## 2018-07-24 DIAGNOSIS — R921 Mammographic calcification found on diagnostic imaging of breast: Secondary | ICD-10-CM | POA: Diagnosis not present

## 2018-07-24 DIAGNOSIS — R928 Other abnormal and inconclusive findings on diagnostic imaging of breast: Secondary | ICD-10-CM | POA: Diagnosis not present

## 2018-08-06 LAB — CUP PACEART REMOTE DEVICE CHECK
Battery Remaining Longevity: 73 mo
Battery Voltage: 3.01 V
Brady Statistic AS VS Percent: 23.22 %
Brady Statistic RA Percent Paced: 72.88 %
Implantable Lead Implant Date: 20150727
Implantable Lead Implant Date: 20150727
Implantable Lead Location: 753860
Implantable Lead Model: 5076
Implantable Pulse Generator Implant Date: 20150727
Lead Channel Impedance Value: 323 Ohm
Lead Channel Impedance Value: 437 Ohm
Lead Channel Pacing Threshold Amplitude: 0.625 V
Lead Channel Pacing Threshold Amplitude: 1.75 V
Lead Channel Pacing Threshold Pulse Width: 0.4 ms
Lead Channel Pacing Threshold Pulse Width: 0.4 ms
Lead Channel Sensing Intrinsic Amplitude: 0.25 mV
Lead Channel Setting Pacing Amplitude: 2 V
MDC IDC LEAD LOCATION: 753859
MDC IDC MSMT LEADCHNL RA IMPEDANCE VALUE: 551 Ohm
MDC IDC MSMT LEADCHNL RA SENSING INTR AMPL: 0.25 mV
MDC IDC MSMT LEADCHNL RV IMPEDANCE VALUE: 380 Ohm
MDC IDC MSMT LEADCHNL RV SENSING INTR AMPL: 4.375 mV
MDC IDC MSMT LEADCHNL RV SENSING INTR AMPL: 4.375 mV
MDC IDC SESS DTM: 20191007181335
MDC IDC SET LEADCHNL RV PACING AMPLITUDE: 3.5 V
MDC IDC SET LEADCHNL RV PACING PULSEWIDTH: 0.4 ms
MDC IDC SET LEADCHNL RV SENSING SENSITIVITY: 2 mV
MDC IDC STAT BRADY AP VP PERCENT: 0.24 %
MDC IDC STAT BRADY AP VS PERCENT: 76.48 %
MDC IDC STAT BRADY AS VP PERCENT: 0.06 %
MDC IDC STAT BRADY RV PERCENT PACED: 0.32 %

## 2018-09-02 ENCOUNTER — Other Ambulatory Visit: Payer: Self-pay | Admitting: Cardiology

## 2018-09-02 MED ORDER — DILTIAZEM HCL ER COATED BEADS 120 MG PO CP24: 120.0000 mg | ORAL_CAPSULE | Freq: Every day | ORAL | 3 refills | Status: DC

## 2018-09-02 NOTE — Telephone Encounter (Signed)
Done

## 2018-09-02 NOTE — Telephone Encounter (Signed)
° °  1. Which medications need to be refilled? (please list name of each medication and dose if known)    diltiazem (CARDIZEM CD) 120 MG 24 hr     2. Which pharmacy/location (including street and city if local pharmacy) is medication to be sent to?   [facility]   3. Do they need a 30 day or 90 day supply?

## 2018-09-16 DIAGNOSIS — K219 Gastro-esophageal reflux disease without esophagitis: Secondary | ICD-10-CM | POA: Diagnosis not present

## 2018-09-16 DIAGNOSIS — Z6828 Body mass index (BMI) 28.0-28.9, adult: Secondary | ICD-10-CM | POA: Diagnosis not present

## 2018-09-16 DIAGNOSIS — R159 Full incontinence of feces: Secondary | ICD-10-CM | POA: Diagnosis not present

## 2018-10-04 ENCOUNTER — Other Ambulatory Visit: Payer: Self-pay | Admitting: Cardiology

## 2018-10-08 DIAGNOSIS — Z124 Encounter for screening for malignant neoplasm of cervix: Secondary | ICD-10-CM | POA: Diagnosis not present

## 2018-10-08 DIAGNOSIS — Z01419 Encounter for gynecological examination (general) (routine) without abnormal findings: Secondary | ICD-10-CM | POA: Diagnosis not present

## 2018-10-08 DIAGNOSIS — N816 Rectocele: Secondary | ICD-10-CM | POA: Diagnosis not present

## 2018-10-08 DIAGNOSIS — K5900 Constipation, unspecified: Secondary | ICD-10-CM | POA: Diagnosis not present

## 2018-10-08 DIAGNOSIS — R159 Full incontinence of feces: Secondary | ICD-10-CM | POA: Diagnosis not present

## 2018-10-14 ENCOUNTER — Ambulatory Visit (INDEPENDENT_AMBULATORY_CARE_PROVIDER_SITE_OTHER): Payer: Medicare Other

## 2018-10-14 DIAGNOSIS — I495 Sick sinus syndrome: Secondary | ICD-10-CM

## 2018-10-15 LAB — CUP PACEART REMOTE DEVICE CHECK
Battery Remaining Longevity: 74 mo
Battery Voltage: 3.01 V
Brady Statistic AP VP Percent: 0.27 %
Brady Statistic AP VS Percent: 73.92 %
Brady Statistic AS VP Percent: 0.04 %
Brady Statistic AS VS Percent: 25.77 %
Brady Statistic RA Percent Paced: 69.69 %
Brady Statistic RV Percent Paced: 0.32 %
Date Time Interrogation Session: 20200107195739
Implantable Lead Implant Date: 20150727
Implantable Lead Implant Date: 20150727
Implantable Lead Location: 753859
Implantable Lead Location: 753860
Implantable Lead Model: 5076
Implantable Lead Model: 5076
Implantable Pulse Generator Implant Date: 20150727
Lead Channel Impedance Value: 380 Ohm
Lead Channel Impedance Value: 399 Ohm
Lead Channel Impedance Value: 437 Ohm
Lead Channel Impedance Value: 551 Ohm
Lead Channel Pacing Threshold Amplitude: 0.625 V
Lead Channel Pacing Threshold Amplitude: 2.5 V
Lead Channel Pacing Threshold Pulse Width: 0.4 ms
Lead Channel Pacing Threshold Pulse Width: 0.4 ms
Lead Channel Sensing Intrinsic Amplitude: 0.375 mV
Lead Channel Sensing Intrinsic Amplitude: 0.375 mV
Lead Channel Sensing Intrinsic Amplitude: 3.125 mV
Lead Channel Sensing Intrinsic Amplitude: 3.125 mV
Lead Channel Setting Pacing Amplitude: 2 V
Lead Channel Setting Pacing Amplitude: 3.5 V
Lead Channel Setting Pacing Pulse Width: 0.4 ms
Lead Channel Setting Sensing Sensitivity: 2 mV

## 2018-10-16 NOTE — Progress Notes (Signed)
Remote pacemaker transmission.   

## 2018-11-11 DIAGNOSIS — I480 Paroxysmal atrial fibrillation: Secondary | ICD-10-CM | POA: Diagnosis not present

## 2018-11-11 DIAGNOSIS — K219 Gastro-esophageal reflux disease without esophagitis: Secondary | ICD-10-CM | POA: Diagnosis not present

## 2018-11-11 DIAGNOSIS — E7800 Pure hypercholesterolemia, unspecified: Secondary | ICD-10-CM | POA: Diagnosis not present

## 2018-11-18 DIAGNOSIS — Z6829 Body mass index (BMI) 29.0-29.9, adult: Secondary | ICD-10-CM | POA: Diagnosis not present

## 2018-11-18 DIAGNOSIS — Z0000 Encounter for general adult medical examination without abnormal findings: Secondary | ICD-10-CM | POA: Diagnosis not present

## 2018-11-25 DIAGNOSIS — M8588 Other specified disorders of bone density and structure, other site: Secondary | ICD-10-CM | POA: Diagnosis not present

## 2018-11-25 DIAGNOSIS — M810 Age-related osteoporosis without current pathological fracture: Secondary | ICD-10-CM | POA: Diagnosis not present

## 2018-11-25 DIAGNOSIS — M85851 Other specified disorders of bone density and structure, right thigh: Secondary | ICD-10-CM | POA: Diagnosis not present

## 2018-12-06 DIAGNOSIS — R0683 Snoring: Secondary | ICD-10-CM | POA: Diagnosis not present

## 2018-12-06 DIAGNOSIS — G4730 Sleep apnea, unspecified: Secondary | ICD-10-CM | POA: Diagnosis not present

## 2018-12-09 DIAGNOSIS — G4730 Sleep apnea, unspecified: Secondary | ICD-10-CM | POA: Diagnosis not present

## 2018-12-09 DIAGNOSIS — R0683 Snoring: Secondary | ICD-10-CM | POA: Diagnosis not present

## 2018-12-11 DIAGNOSIS — R0683 Snoring: Secondary | ICD-10-CM | POA: Diagnosis not present

## 2018-12-11 DIAGNOSIS — G4730 Sleep apnea, unspecified: Secondary | ICD-10-CM | POA: Diagnosis not present

## 2019-01-08 ENCOUNTER — Telehealth: Payer: Self-pay | Admitting: Cardiology

## 2019-01-08 NOTE — Telephone Encounter (Signed)
Although we are still learning about what the most specific high risk conditions are, I would not think that the presence of a cardiac pacemaker would necessarily place her at higher risk of severe COVID-19.  It is reassuring that she has no known history of CAD or cardiomyopathy and does not have diabetes, these do seem to be higher risk conditions.  Patients in their 25s to 20s are at higher risk for more severe symptoms in general however. The CDC website is a good resource for her to check.

## 2019-01-08 NOTE — Telephone Encounter (Signed)
Patient would like to know if she is considered "high risk" to COVID 19 due to having a pacemaker and being 68 yrs old. / tg

## 2019-01-08 NOTE — Telephone Encounter (Signed)
Pt voiced understanding and appreciative of explanation  

## 2019-01-13 ENCOUNTER — Ambulatory Visit (INDEPENDENT_AMBULATORY_CARE_PROVIDER_SITE_OTHER): Payer: Medicare Other | Admitting: *Deleted

## 2019-01-13 ENCOUNTER — Other Ambulatory Visit: Payer: Self-pay

## 2019-01-13 DIAGNOSIS — I495 Sick sinus syndrome: Secondary | ICD-10-CM | POA: Diagnosis not present

## 2019-01-13 LAB — CUP PACEART REMOTE DEVICE CHECK
Battery Remaining Longevity: 63 mo
Battery Voltage: 3 V
Brady Statistic AP VP Percent: 0.14 %
Brady Statistic AP VS Percent: 79.31 %
Brady Statistic AS VP Percent: 0.03 %
Brady Statistic AS VS Percent: 20.51 %
Brady Statistic RA Percent Paced: 75.59 %
Brady Statistic RV Percent Paced: 0.18 %
Date Time Interrogation Session: 20200406124305
Implantable Lead Implant Date: 20150727
Implantable Lead Implant Date: 20150727
Implantable Lead Location: 753859
Implantable Lead Location: 753860
Implantable Lead Model: 5076
Implantable Lead Model: 5076
Implantable Pulse Generator Implant Date: 20150727
Lead Channel Impedance Value: 399 Ohm
Lead Channel Impedance Value: 437 Ohm
Lead Channel Impedance Value: 494 Ohm
Lead Channel Impedance Value: 532 Ohm
Lead Channel Pacing Threshold Amplitude: 0.625 V
Lead Channel Pacing Threshold Amplitude: 2.5 V
Lead Channel Pacing Threshold Pulse Width: 0.4 ms
Lead Channel Pacing Threshold Pulse Width: 0.4 ms
Lead Channel Sensing Intrinsic Amplitude: 2.875 mV
Lead Channel Sensing Intrinsic Amplitude: 2.875 mV
Lead Channel Sensing Intrinsic Amplitude: 2.875 mV
Lead Channel Sensing Intrinsic Amplitude: 2.875 mV
Lead Channel Setting Pacing Amplitude: 2 V
Lead Channel Setting Pacing Amplitude: 3.5 V
Lead Channel Setting Pacing Pulse Width: 0.4 ms
Lead Channel Setting Sensing Sensitivity: 2 mV

## 2019-01-21 ENCOUNTER — Encounter: Payer: Self-pay | Admitting: Cardiology

## 2019-01-21 NOTE — Progress Notes (Signed)
Remote pacemaker transmission.   

## 2019-01-27 ENCOUNTER — Telehealth: Payer: Self-pay | Admitting: Cardiology

## 2019-01-27 NOTE — Telephone Encounter (Signed)
Pt denies any active chest pain - says since 4/19 once or twice weekly has been having SOB/chest pain/dizziness lasting less than 10 mins - rates pain 5 out of 10 and feeling increased weakness after the episodes. Started taking pantoprazole 20 mg daily. Says HR according to smart watch goes up to 120s just walking to mailbox or bathroom. Doesn't know what BP has been. Was used to walking 30-40 mins without symptoms and now cannot walk less than 10 mins without symptoms. Denies any swelling and has gained about 5-6lbs but attributes this to less exercised and increased appetite - will forward to Dr Domenic Polite and device clinic (pt requested remote check on her device)

## 2019-01-27 NOTE — Telephone Encounter (Signed)
Based on her history, I wonder if she is having more frequent episodes of atrial fibrillation.  Please see if she can get her device interrogated and set her up for a telehealth visit with Dr. [name].  She is already on Eliquis and if she is having increased frequency atrial fibrillation, antiarrhythmic therapy may need to be discussed.

## 2019-01-27 NOTE — Telephone Encounter (Signed)
Called patient and received transmission - no AF episodes since 4/14 - her symptoms occurred over the weekend.    Otherwise normal device function.   Will forward to Dr Domenic Polite for review.  Chanetta Marshall, NP 01/27/2019 12:00 PM

## 2019-01-27 NOTE — Telephone Encounter (Signed)
Pt c/o of Chest Pain:  1. Are you having CP right now? No  2. Are you experiencing any other symptoms (ex. SOB, nausea, vomiting, sweating)? SOB continues to get worse  3. How long have you been experiencing CP? 01-26-2019  4. Is your CP continuous or coming and going? Yes  5. Have you taken Nitroglycerin? No

## 2019-01-27 NOTE — Telephone Encounter (Signed)
Pt verbalized consent for telehealth appt 01/28/19 with Dr Domenic Polite. Reviewed pt medications/pharmacy/allergies. Pt will have BP/HR/weight available

## 2019-01-27 NOTE — Telephone Encounter (Signed)
Thank you.  In that case we should schedule a telehealth visit with me and talk about other options.  I have not seen her since 2018.

## 2019-01-28 ENCOUNTER — Telehealth (INDEPENDENT_AMBULATORY_CARE_PROVIDER_SITE_OTHER): Payer: Medicare Other | Admitting: Cardiology

## 2019-01-28 ENCOUNTER — Encounter: Payer: Self-pay | Admitting: Cardiology

## 2019-01-28 ENCOUNTER — Telehealth: Payer: Self-pay | Admitting: Cardiology

## 2019-01-28 VITALS — BP 121/65 | HR 86 | Ht 68.0 in | Wt 186.0 lb

## 2019-01-28 DIAGNOSIS — R002 Palpitations: Secondary | ICD-10-CM

## 2019-01-28 DIAGNOSIS — I495 Sick sinus syndrome: Secondary | ICD-10-CM | POA: Diagnosis not present

## 2019-01-28 DIAGNOSIS — I480 Paroxysmal atrial fibrillation: Secondary | ICD-10-CM

## 2019-01-28 DIAGNOSIS — R0602 Shortness of breath: Secondary | ICD-10-CM

## 2019-01-28 DIAGNOSIS — Z7189 Other specified counseling: Secondary | ICD-10-CM | POA: Diagnosis not present

## 2019-01-28 MED ORDER — DILTIAZEM HCL ER COATED BEADS 180 MG PO CP24: 180.0000 mg | ORAL_CAPSULE | Freq: Every day | ORAL | 2 refills | Status: DC

## 2019-01-28 NOTE — Telephone Encounter (Signed)
Patient rescheduled test to may 21,2020   [place]

## 2019-01-28 NOTE — Progress Notes (Signed)
Virtual Visit via Telephone Note   This visit type was conducted due to national recommendations for restrictions regarding the COVID-19 Pandemic (e.g. social distancing) in an effort to limit this patient's exposure and mitigate transmission in our community.  Due to her co-morbid illnesses, this patient is at least at moderate risk for complications without adequate follow up.  This format is felt to be most appropriate for this patient at this time.  The patient did not have access to video technology/had technical difficulties with video requiring transitioning to audio format only (telephone).  All issues noted in this document were discussed and addressed.  No physical exam could be performed with this format.  Please refer to the patient's chart for her  consent to telehealth for [facility].   Evaluation Performed:  Follow-up visit  Date:  01/28/2019   ID:  Tiffany Melton, DOB 10-06-1951, MRN [id number]  Patient Location: Home Provider Location: Office  PCP:  Rory Percy, MD  Cardiologist:  Rozann Lesches, MD Electrophysiologist:  Thompson Grayer, MD   Chief Complaint: Shortness of breath, CP and palpitations  History of Present Illness:    Tiffany Melton is a 68 y.o. female last seen in November 2018.  She called the office yesterday and was scheduled for a telehealth visit with me today.  She states that since December 2019 she has been having intermittent episodes of shortness of breath and feeling of rapid heart rate, sometimes in the 120s.  She has also experienced exertional chest pain during these times.  She has had some episodes recently within the last week.  She has not had any frank syncope.  She states that she has been taking her medications regularly.  She has gained about 5 pounds.  She follows with Dr. [name], Medtronic pacemaker in place.  Device was interrogated yesterday and did not reveal any atrial fibrillation episodes since April 14, otherwise normal device  function.  Tiffany Melton indicated report of symptoms only over the weekend, however the patient has been having them fairly frequently and recalls not feeling well on April 14-15.  She has been on Cardizem CD 120 mg daily, has not tried using the short acting tablets.  Otherwise no bleeding problems on Eliquis.  The patient does not have symptoms concerning for COVID-19 infection (fever, chills, cough, or new shortness of breath).    Past Medical History:  Diagnosis Date  . Abnormal stress test    GXT 9/13, cath revealed normal coronary arteries  . Paroxysmal atrial fibrillation (HCC)   . Raynaud's disease   . Sick sinus syndrome [facility])    Past Surgical History:  Procedure Laterality Date  . BACK SURGERY    . CARDIAC CATHETERIZATION  2013  . CHOLECYSTECTOMY    . PACEMAKER INSERTION  05-04-14   MDT Advisa dual chamber pacemaker implanted by Dr Caryl Comes for SSS, atrial fibrillation post termination pauses  . PERMANENT PACEMAKER INSERTION N/A 05/04/2014   Procedure: PERMANENT PACEMAKER INSERTION;  Surgeon: Deboraha Sprang, MD;  Location: [facility] CATH LAB;  Service: Cardiovascular;  Laterality: N/A;  . TONSILLECTOMY       Current Meds  Medication Sig  . apixaban (ELIQUIS) 5 MG TABS tablet Take 1 tablet (5 mg total) by mouth 2 (two) times daily.  Marland Kitchen diltiazem (CARDIZEM) 30 MG tablet Take 1 tablet (30 mg total) by mouth as needed.  Marland Kitchen ELIQUIS 5 MG TABS tablet TAKE 1 TABLET TWICE A DAY  (NEED OFFICE VISIT WITH    )  .  pantoprazole (PROTONIX) 20 MG tablet Take 1 tablet by mouth daily.  . simvastatin (ZOCOR) 10 MG tablet Take 1 tablet (10 mg total) by mouth daily.  . [DISCONTINUED] diltiazem (CARDIZEM CD) 120 MG 24 hr capsule Take 1 capsule (120 mg total) by mouth daily.     Allergies:   Patient has no known allergies.   Social History   Tobacco Use  . Smoking status: Former Smoker    Packs/day: 1.00    Years: 35.00    Pack years: 35.00    Types: Cigarettes    Start date: 10/09/1962     Last attempt to quit: 08/09/1982    Years since quitting: 36.4  . Smokeless tobacco: Never Used  Substance Use Topics  . Alcohol use: No    Alcohol/week: 0.0 standard drinks  . Drug use: No     Family Hx: The patient's family history includes Hypertension in her unknown relative.  ROS:   Please see the history of present illness.    All other systems reviewed and are negative.   Prior CV studies:   The following studies were reviewed today:  Echocardiogram 05/01/2014: Study Conclusions  - Left ventricle: The cavity size was normal. Wall thickness was normal. Systolic function was normal. The estimated ejection fraction was in the range of 60% to 65%. Wall motion was normal; there were no regional wall motion abnormalities.  Impressions:  - Normal LV function; cannot R/O mass in ascending aorta; suggest CTA to further assess.  Cardiac CT 05/01/2014: FINDINGS: Technical quality: Suboptimal. There was a malfunction with the scanner such that the scanner was decoupled from the injector and bolus timing was significantly off (images were acquired during the venous phase rather than arterial phase of acquisition).  Heart rate:  60-62  CORONARY ARTERIES:  Evaluation of coronary artery is was nondiagnostic secondary to the limitations of the examination.  CORONARY CALCIUM: Total Agatston Score:  52  MESA database percentile:  93rd  AORTA AND PULMONARY MEASUREMENTS:  Aortic root (21 - 40 mm):  23  mm at the annulus  31  mm at the sinuses of Valsalva  27  mm at the sinotubular junction  Ascending aorta: (< 40 mm):  35 mm  Descending aorta:  (< 40 mm):  28 mm  Main pulmonary artery: (< 30 mm):  29 mm  OTHER FINDINGS: The aortic valve is mildly calcified, predominantly on the left cusp near the commissure with the right cusp and near the commissure with the noncoronary cusp. Small amount of scarring in the inferior segment of the lingula.  Dependent subsegmental atelectasis in the lower lobes of the lungs bilaterally. No suspicious appearing pulmonary nodules or masses are identified within the visualized thorax. Visualized portions of the upper abdomen are unremarkable. Old compression fracture of a mid thoracic vertebral body (likely T9) with approximately 15% loss of anterior vertebral body height.  IMPRESSION: 1. Despite the limitations of today's examination, there is no aortic root or ascending aorta mass identified. 2. Mild sclerosis of the aortic valve. 3. The patient's total coronary artery calcium score is 42 which is 93rd percentile for patient's of matched age, gender and race/ethnicity. 4. Additional incidental findings, as above. These results were called by telephone at the time of interpretation on 05/01/2014 at 7:45 pm to Dr. Candee Furbish , who verbally acknowledged these results.  Labs/Other Tests and Data Reviewed:    EKG:  An ECG dated 01/15/2017 was personally reviewed today and demonstrated:  Sinus rhythm with PVC.  Recent Labs:  No recent lab work available from PCP.  Wt Readings from Last 3 Encounters:  01/28/19 186 lb (84.4 kg)  04/12/18 180 lb (81.6 kg)  08/28/17 187 lb 9.6 oz (85.1 kg)     Objective:    Vital Signs:  BP 121/65   Pulse 86   Ht 5\' 8"  (1.727 m)   Wt 186 lb (84.4 kg)   BMI 28.28 kg/m    Patient answered questions spontaneously on the phone.  She was not breathless while speaking in full sentences.  Denied any active chest pain or palpitations.  ASSESSMENT & PLAN:    1.  Episodic shortness of breath and chest pain as well as sense of palpitations.  She has had no syncope.  She reports compliance with her medications and Medtronic pacemaker device function was grossly normal by interrogation yesterday.  Last documented atrial fibrillation was on April 14.  Plan is to increase Cardizem CD to 180 mg daily and follow-up with an echocardiogram and ECG.  We will see how she  does from there and determine if any further testing is indicated.  2.  History of normal coronary arteries by cardiac catheterization in 2013.  3.  Sick sinus syndrome with Medtronic pacemaker in place, followed by Dr. [name].  4.  Paroxysmal atrial fibrillation, on Eliquis for stroke prophylaxis.  COVID-19 Education: The signs and symptoms of COVID-19 were discussed with the patient and how to seek care for testing (follow up with PCP or arrange E-visit).  The importance of social distancing was discussed today.  Time:   Today, I have spent 9 minutes with the patient with telehealth technology discussing the above problems.     Medication Adjustments/Labs and Tests Ordered: Current medicines are reviewed at length with the patient today.  Concerns regarding medicines are outlined above.   Tests Ordered: Orders Placed This Encounter  Procedures  . ECHOCARDIOGRAM COMPLETE    Medication Changes: Meds ordered this encounter  Medications  . diltiazem (CARDIZEM CD) 180 MG 24 hr capsule    Sig: Take 1 capsule (180 mg total) by mouth daily.    Dispense:  90 capsule    Refill:  2    01/28/2019 dose increase    Disposition:  Follow up test results and determine next step.  Signed, Rozann Lesches, MD  01/28/2019 4:06 PM    [facility]

## 2019-01-28 NOTE — Telephone Encounter (Signed)
°  Precert needed for: ECHO   Location: [facility]   Date: Mar 06, 2019

## 2019-01-28 NOTE — Patient Instructions (Addendum)
Medication Instructions:   Your physician has recommended you make the following change in your medication:   Increase cardizem cd to 180 mg by mouth daily  Continue all other medications the same  Labwork:  NONE  Testing/Procedures: Your physician has requested that you have an echocardiogram. Echocardiography is a painless test that uses sound waves to create images of your heart. It provides your doctor with information about the size and shape of your heart and how well your heart's chambers and valves are working. This procedure takes approximately one hour. There are no restrictions for this procedure.  Follow-Up:  Your physician recommends that you schedule a follow-up appointment in: pending echo result  Please schedule an appointment for an EKG in the office next week.  Any Other Special Instructions Will Be Listed Below (If Applicable).  If you need a refill on your cardiac medications before your next appointment, please call your pharmacy.

## 2019-02-03 ENCOUNTER — Ambulatory Visit: Payer: Medicare Other

## 2019-02-06 ENCOUNTER — Ambulatory Visit (INDEPENDENT_AMBULATORY_CARE_PROVIDER_SITE_OTHER): Payer: Medicare Other

## 2019-02-06 ENCOUNTER — Ambulatory Visit (INDEPENDENT_AMBULATORY_CARE_PROVIDER_SITE_OTHER): Payer: Medicare Other | Admitting: Cardiology

## 2019-02-06 VITALS — BP 135/83 | HR 72 | Temp 98.1°F | Resp 18 | Ht 69.0 in | Wt 187.0 lb

## 2019-02-06 DIAGNOSIS — I480 Paroxysmal atrial fibrillation: Secondary | ICD-10-CM

## 2019-02-06 DIAGNOSIS — R079 Chest pain, unspecified: Secondary | ICD-10-CM

## 2019-02-06 DIAGNOSIS — R0602 Shortness of breath: Secondary | ICD-10-CM

## 2019-02-06 NOTE — Progress Notes (Signed)
Patient came for EKG and Echo today.No c/o CP today, said she has had some SOB    I will forward to Dr.McDowell

## 2019-02-06 NOTE — Patient Instructions (Signed)
I will call you if there are any changes to your medication or care plan.     Thank you for choosing [facility] !

## 2019-02-06 NOTE — Progress Notes (Signed)
Reviewed. ECG today shows an atrial paced rhythm (she is not in atria fibrillation) and also PVCs (likely from RVOT region - generally benign but may be giving her symptoms). LVEF is vigorous at 65-70% by echocardiogram. We just increased her calcium channel blocker, would observe for now.

## 2019-02-27 ENCOUNTER — Other Ambulatory Visit: Payer: Medicare Other

## 2019-03-06 ENCOUNTER — Other Ambulatory Visit: Payer: Medicare Other

## 2019-03-10 ENCOUNTER — Ambulatory Visit: Payer: Medicare Other | Admitting: Cardiology

## 2019-04-14 ENCOUNTER — Ambulatory Visit (INDEPENDENT_AMBULATORY_CARE_PROVIDER_SITE_OTHER): Payer: Medicare Other | Admitting: *Deleted

## 2019-04-14 DIAGNOSIS — I495 Sick sinus syndrome: Secondary | ICD-10-CM | POA: Diagnosis not present

## 2019-04-15 ENCOUNTER — Telehealth: Payer: Self-pay

## 2019-04-15 LAB — CUP PACEART REMOTE DEVICE CHECK
Battery Remaining Longevity: 61 mo
Battery Voltage: 3 V
Brady Statistic AP VP Percent: 0.23 %
Brady Statistic AP VS Percent: 81.62 %
Brady Statistic AS VP Percent: 0.09 %
Brady Statistic AS VS Percent: 18.06 %
Brady Statistic RA Percent Paced: 77.02 %
Brady Statistic RV Percent Paced: 0.32 %
Date Time Interrogation Session: 20200707154030
Implantable Lead Implant Date: 20150727
Implantable Lead Implant Date: 20150727
Implantable Lead Location: 753859
Implantable Lead Location: 753860
Implantable Lead Model: 5076
Implantable Lead Model: 5076
Implantable Pulse Generator Implant Date: 20150727
Lead Channel Impedance Value: 475 Ohm
Lead Channel Impedance Value: 494 Ohm
Lead Channel Impedance Value: 532 Ohm
Lead Channel Impedance Value: 608 Ohm
Lead Channel Pacing Threshold Amplitude: 0.625 V
Lead Channel Pacing Threshold Amplitude: 2.5 V
Lead Channel Pacing Threshold Pulse Width: 0.4 ms
Lead Channel Pacing Threshold Pulse Width: 0.4 ms
Lead Channel Sensing Intrinsic Amplitude: 0.25 mV
Lead Channel Sensing Intrinsic Amplitude: 0.25 mV
Lead Channel Sensing Intrinsic Amplitude: 2.625 mV
Lead Channel Sensing Intrinsic Amplitude: 2.625 mV
Lead Channel Setting Pacing Amplitude: 2 V
Lead Channel Setting Pacing Amplitude: 3.5 V
Lead Channel Setting Pacing Pulse Width: 0.4 ms
Lead Channel Setting Sensing Sensitivity: 2 mV

## 2019-04-15 NOTE — Telephone Encounter (Signed)
Spoke with patient to remind of missed remote transmission 

## 2019-04-16 ENCOUNTER — Other Ambulatory Visit: Payer: Self-pay | Admitting: Cardiology

## 2019-04-16 MED ORDER — APIXABAN 5 MG PO TABS: 5.0000 mg | ORAL_TABLET | Freq: Two times a day (BID) | ORAL | 2 refills | Status: DC

## 2019-04-16 MED ORDER — APIXABAN 5 MG PO TABS: 5.0000 mg | ORAL_TABLET | Freq: Two times a day (BID) | ORAL | 3 refills | Status: DC

## 2019-04-16 NOTE — Telephone Encounter (Signed)
°  1. Which medications need to be refilled? (please list name of each medication and dose if known)  ELIQUIS  5 MG   2. Which pharmacy/location (including street and city if local pharmacy) is medication to be sent to? [facility]   3. Do they need a 30 day or 90 day supply? [place]

## 2019-04-18 ENCOUNTER — Encounter: Payer: PRIVATE HEALTH INSURANCE | Admitting: Internal Medicine

## 2019-04-22 ENCOUNTER — Encounter: Payer: Self-pay | Admitting: Cardiology

## 2019-04-22 NOTE — Progress Notes (Signed)
Remote pacemaker transmission.   

## 2019-04-28 ENCOUNTER — Other Ambulatory Visit: Payer: Self-pay | Admitting: Cardiology

## 2019-04-28 MED ORDER — APIXABAN 5 MG PO TABS: 5.0000 mg | ORAL_TABLET | Freq: Two times a day (BID) | ORAL | 3 refills | Status: DC

## 2019-04-28 MED ORDER — DILTIAZEM HCL ER COATED BEADS 180 MG PO CP24: 180.0000 mg | ORAL_CAPSULE | Freq: Every day | ORAL | 3 refills | Status: DC

## 2019-04-28 NOTE — Telephone Encounter (Signed)
Done

## 2019-04-28 NOTE — Telephone Encounter (Signed)
° °  1. Which medications need to be refilled? (please list name of each medication and dose if known) diltiazem (CARDIZEM CD) 180 MG 24 hr &  Eliquis  (needs to make certain that both go to [facility]    2. Which pharmacy/location (including street and city if local pharmacy) is medication to be sent to? [place]   3. Do they need a 30 day or 90 day supply? [place]

## 2019-05-09 ENCOUNTER — Encounter: Payer: PRIVATE HEALTH INSURANCE | Admitting: Internal Medicine

## 2019-05-15 ENCOUNTER — Ambulatory Visit: Payer: Medicare Other | Admitting: Cardiology

## 2019-06-01 NOTE — Progress Notes (Signed)
Virtual Visit via Telephone Note   This visit type was conducted due to national recommendations for restrictions regarding the COVID-19 Pandemic (e.g. social distancing) in an effort to limit this patient's exposure and mitigate transmission in our community.  Due to her co-morbid illnesses, this patient is at least at moderate risk for complications without adequate follow up.  This format is felt to be most appropriate for this patient at this time.  The patient did not have access to video technology/had technical difficulties with video requiring transitioning to audio format only (telephone).  All issues noted in this document were discussed and addressed.  No physical exam could be performed with this format.  Please refer to the patient's chart for her  consent to telehealth for [facility].   Date:  06/02/2019   ID:  Tiffany Melton, DOB [birth year], MRN [id number]  Patient Location: Home Provider Location: Office  PCP:  Rory Percy, MD  Cardiologist:  Rozann Lesches, MD Electrophysiologist:  Thompson Grayer, MD   Evaluation Performed:  Follow-Up Visit  Chief Complaint:   Cardiac follow-up  History of Present Illness:    Tiffany Melton is a 68 y.o. female last assessed via telehealth encounter in April. We had a similar visit today.  She states that she feels better since we increased Cardizem CD dose, fewer episodes of palpitations.  She follows with Dr. [name], Medtronic pacemaker in place.  Device interrogation in July showed normal function with 1.4% AF burden, longest lasting 7 hours.  I reviewed her medications which are stable as outlined below.  She reports no bleeding problems on Eliquis.  The patient does not have symptoms concerning for COVID-19 infection (fever, chills, cough, or new shortness of breath).  She states that she has been social distancing, wears a mask when she goes out.  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   Past Medical History:  Diagnosis  Date  . Abnormal stress test    GXT 9/13, cath revealed normal coronary arteries  . Paroxysmal atrial fibrillation (HCC)   . Raynaud's disease   . Sick sinus syndrome [facility])    Past Surgical History:  Procedure Laterality Date  . BACK SURGERY    . CARDIAC CATHETERIZATION  2013  . CHOLECYSTECTOMY    . PACEMAKER INSERTION  05-04-14   MDT Advisa dual chamber pacemaker implanted by Dr Caryl Comes for SSS, atrial fibrillation post termination pauses  . PERMANENT PACEMAKER INSERTION N/A 05/04/2014   Procedure: PERMANENT PACEMAKER INSERTION;  Surgeon: Deboraha Sprang, MD;  Location: [facility] CATH LAB;  Service: Cardiovascular;  Laterality: N/A;  . TONSILLECTOMY       Current Meds  Medication Sig  . apixaban (ELIQUIS) 5 MG TABS tablet Take 1 tablet (5 mg total) by mouth 2 (two) times daily.  Marland Kitchen diltiazem (CARDIZEM CD) 180 MG 24 hr capsule Take 1 capsule (180 mg total) by mouth daily.  Marland Kitchen diltiazem (CARDIZEM) 30 MG tablet Take 1 tablet (30 mg total) by mouth as needed.  . pantoprazole (PROTONIX) 20 MG tablet Take 1 tablet (20 mg total) by mouth daily.  . simvastatin (ZOCOR) 10 MG tablet Take 1 tablet (10 mg total) by mouth daily.  . [DISCONTINUED] pantoprazole (PROTONIX) 20 MG tablet Take 1 tablet by mouth daily.     Allergies:   Patient has no known allergies.   Social History   Tobacco Use  . Smoking status: Former Smoker    Packs/day: 1.00    Years: 35.00  Pack years: 35.00    Types: Cigarettes    Start date: 10/09/1962    Quit date: 08/09/1982    Years since quitting: 36.8  . Smokeless tobacco: Never Used  Substance Use Topics  . Alcohol use: No    Alcohol/week: 0.0 standard drinks  . Drug use: No     Family Hx: The patient's family history includes Hypertension in an other family member.  ROS:   Please see the history of present illness. All other systems reviewed and are negative.   Prior CV studies:   The following studies were reviewed today:  Echocardiogram 02/06/2019:  1.  The left ventricle has hyperdynamic systolic function, with an ejection fraction of >65%. The cavity size was normal. Left ventricular diastolic parameters were normal. No evidence of left ventricular regional wall motion abnormalities.  2. The right ventricle has normal systolic function. The cavity was normal. There is no increase in right ventricular wall thickness. Right ventricular systolic pressure normal with an estimated pressure of 22.9 mmHg.  3. The pericardial effusion is posterior to the left ventricle.  4. Trivial pericardial effusion is present.  5. The aortic valve is tricuspid. Mild calcification of the aortic valve. Mild aortic annular calcification noted.  6. The mitral valve is grossly normal. Mild thickening of the mitral valve leaflet.  7. The tricuspid valve is grossly normal.  8. The aortic root is normal in size and structure.  Labs/Other Tests and Data Reviewed:    EKG:  An ECG dated 02/06/2019 was personally reviewed today and demonstrated:  Atrial paced rhythm with PVCs likely originating from outflow tract.  Recent Labs:    Wt Readings from Last 3 Encounters:  06/02/19 183 lb (83 kg)  02/06/19 187 lb (84.8 kg)  01/28/19 186 lb (84.4 kg)     Objective:    Vital Signs:  BP 136/73   Pulse 87   Ht 5\' 9"  (1.753 m)   Wt 183 lb (83 kg)   BMI 27.02 kg/m    Patient spoke in full sentences, not short of breath. No audible wheezing or coughing. Speech pattern normal.  ASSESSMENT & PLAN:    1.  Paroxysmal atrial fibrillation.  Fairly low rhythm burden by most recent device interrogation.  She continues on Cardizem CD and Eliquis.  Check CBC and BMET for next visit.  2.  Outflow tract PVCs with normal LVEF.  She is tolerating calcium channel blocker at this time, no changes made to current regimen.  3.  History of normal coronary arteries by cardiac catheterization in 2013.  4.  Sick sinus syndrome with Medtronic pacemaker in place.  Keep follow-up with Dr.  [name].  COVID-19 Education: The signs and symptoms of COVID-19 were discussed with the patient and how to seek care for testing (follow up with PCP or arrange E-visit).  The importance of social distancing was discussed today.  Time:   Today, I have spent 6 minutes with the patient with telehealth technology discussing the above problems.     Medication Adjustments/Labs and Tests Ordered: Current medicines are reviewed at length with the patient today.  Concerns regarding medicines are outlined above.   Tests Ordered: No orders of the defined types were placed in this encounter.   Medication Changes: Meds ordered this encounter  Medications  . pantoprazole (PROTONIX) 20 MG tablet    Sig: Take 1 tablet (20 mg total) by mouth daily.    Dispense:  90 tablet    Refill:  3  Follow Up:  In Person 6 months in the [place] office.  Signed, Rozann Lesches, MD  06/02/2019 11:57 AM    [place]

## 2019-06-02 ENCOUNTER — Telehealth: Payer: Self-pay | Admitting: Cardiology

## 2019-06-02 ENCOUNTER — Telehealth (INDEPENDENT_AMBULATORY_CARE_PROVIDER_SITE_OTHER): Payer: Medicare Other | Admitting: Cardiology

## 2019-06-02 ENCOUNTER — Encounter: Payer: Self-pay | Admitting: Cardiology

## 2019-06-02 VITALS — BP 136/73 | HR 87 | Ht 69.0 in | Wt 183.0 lb

## 2019-06-02 DIAGNOSIS — I493 Ventricular premature depolarization: Secondary | ICD-10-CM

## 2019-06-02 DIAGNOSIS — I480 Paroxysmal atrial fibrillation: Secondary | ICD-10-CM | POA: Diagnosis not present

## 2019-06-02 DIAGNOSIS — Z950 Presence of cardiac pacemaker: Secondary | ICD-10-CM

## 2019-06-02 DIAGNOSIS — I495 Sick sinus syndrome: Secondary | ICD-10-CM

## 2019-06-02 MED ORDER — PANTOPRAZOLE SODIUM 20 MG PO TBEC: 20.0000 mg | DELAYED_RELEASE_TABLET | Freq: Every day | ORAL | 3 refills | Status: DC

## 2019-06-02 NOTE — Patient Instructions (Signed)
Medication Instructions:   Protonix refilled today for 90 day supply with refills.   Continue all other medications.    Labwork:  CBC, BMET   Due just prior to next office visit.   Testing/Procedures: none  Follow-Up: Your physician wants you to follow up in: 6 months.  You will receive a reminder letter in the mail one-two months in advance.  If you don't receive a letter, please call our office to schedule the follow up appointment   Any Other Special Instructions Will Be Listed Below (If Applicable).  If you need a refill on your cardiac medications before your next appointment, please call your pharmacy.

## 2019-06-02 NOTE — Telephone Encounter (Signed)
Virtual Visit Pre-Appointment Phone Call  "(Name), I am calling you today to discuss your upcoming appointment. We are currently trying to limit exposure to the virus that causes COVID-19 by seeing patients at home rather than in the office."  1. "What is the BEST phone number to call the day of the visit?" - include this in appointment notes  2. Do you have or have access to (through a family member/friend) a smartphone with video capability that we can use for your visit?" a. If yes - list this number in appt notes as cell (if different from BEST phone #) and list the appointment type as a VIDEO visit in appointment notes b. If no - list the appointment type as a PHONE visit in appointment notes  Confirm consent - "In the setting of the current Covid19 crisis, you are scheduled for a (phone or video) visit with your provider on (date) at (time).  Just as we do with many in-office visits, in order for you to participate in this visit, we must obtain consent.  If you'd like, I can send this to your mychart (if signed up) or email for you to review.  Otherwise, I can obtain your verbal consent now.  All virtual visits are billed to your insurance company just like a normal visit would be.  By agreeing to a virtual visit, we'd like you to understand that the technology does not allow for your provider to perform an examination, and thus may limit your provider's ability to fully assess your condition. If your provider identifies any concerns that need to be evaluated in person, we will make arrangements to do so.  Finally, though the technology is pretty good, we cannot assure that it will always work on either your or our end, and in the setting of a video visit, we may have to convert it to a phone-only visit.  In either situation, we cannot ensure that we have a secure connection.  Are you willing to proceed?" STAFF: Did the patient verbally acknowledge consent to telehealth visit? Document  YES/NO here: yes 3. Advise patient to be prepared - "Two hours prior to your appointment, go ahead and check your blood pressure, pulse, oxygen saturation, and your weight (if you have the equipment to check those) and write them all down. When your visit starts, your provider will ask you for this information. If you have an Apple Watch or Kardia device, please plan to have heart rate information ready on the day of your appointment. Please have a pen and paper handy nearby the day of the visit as well."  4. Give patient instructions for MyChart download to smartphone OR Doximity/Doxy.me as below if video visit (depending on what platform provider is using)  5. Inform patient they will receive a phone call 15 minutes prior to their appointment time (may be from unknown caller ID) so they should be prepared to answer    TELEPHONE CALL NOTE  Meeghan R Rezendes has been deemed a candidate for a follow-up tele-health visit to limit community exposure during the Covid-19 pandemic. I spoke with the patient via phone to ensure availability of phone/video source, confirm preferred email & phone number, and discuss instructions and expectations.  I reminded Tiffany Melton to be prepared with any vital sign and/or heart rhythm information that could potentially be obtained via home monitoring, at the time of her visit. I reminded Wallace R Eilers to expect a phone call prior to her visit.  Weston Anna 06/02/2019 8:06 AM   INSTRUCTIONS FOR DOWNLOADING THE MYCHART APP TO SMARTPHONE  - The patient must first make sure to have activated MyChart and know their login information - If Apple, go to [employer] and type in MyChart in the search bar and download the app. If Android, ask patient to go to [employer] and type in [place] in the search bar and download the app. The app is free but as with any other app downloads, their phone may require them to verify saved payment information or Apple/Android password.    - The patient will need to then log into the app with their MyChart username and password, and select [facility] as their healthcare provider to link the account. When it is time for your visit, go to the MyChart app, find appointments, and click Begin Video Visit. Be sure to Select Allow for your device to access the Microphone and Camera for your visit. You will then be connected, and your provider will be with you shortly.  **If they have any issues connecting, or need assistance please contact MyChart service desk [phone])**  **If using a computer, in order to ensure the best quality for their visit they will need to use either of the following Internet Browsers: [employer], or Google Chrome**  IF USING DOXIMITY or DOXY.ME - The patient will receive a link just prior to their visit by text.     FULL LENGTH CONSENT FOR TELE-HEALTH VISIT   I hereby voluntarily request, consent and authorize [place] and its employed or contracted physicians, physician assistants, nurse practitioners or other licensed health care professionals (the Practitioner), to provide me with telemedicine health care services (the Services") as deemed necessary by the treating Practitioner. I acknowledge and consent to receive the Services by the Practitioner via telemedicine. I understand that the telemedicine visit will involve communicating with the Practitioner through live audiovisual communication technology and the disclosure of certain medical information by electronic transmission. I acknowledge that I have been given the opportunity to request an in-person assessment or other available alternative prior to the telemedicine visit and am voluntarily participating in the telemedicine visit.  I understand that I have the right to withhold or withdraw my consent to the use of telemedicine in the course of my care at any time, without affecting my right to future care or treatment, and that  the Practitioner or I may terminate the telemedicine visit at any time. I understand that I have the right to inspect all information obtained and/or recorded in the course of the telemedicine visit and may receive copies of available information for a reasonable fee.  I understand that some of the potential risks of receiving the Services via telemedicine include:   Delay or interruption in medical evaluation due to technological equipment failure or disruption;  Information transmitted may not be sufficient (e.g. poor resolution of images) to allow for appropriate medical decision making by the Practitioner; and/or   In rare instances, security protocols could fail, causing a breach of personal health information.  Furthermore, I acknowledge that it is my responsibility to provide information about my medical history, conditions and care that is complete and accurate to the best of my ability. I acknowledge that Practitioner's advice, recommendations, and/or decision may be based on factors not within their control, such as incomplete or inaccurate data provided by me or distortions of diagnostic images or specimens that may result from electronic transmissions. I understand that  the practice of medicine is not an exact science and that Practitioner makes no warranties or guarantees regarding treatment outcomes. I acknowledge that I will receive a copy of this consent concurrently upon execution via email to the email address I last provided but may also request a printed copy by calling the office of [place].    I understand that my insurance will be billed for this visit.   I have read or had this consent read to me.  I understand the contents of this consent, which adequately explains the benefits and risks of the Services being provided via telemedicine.   I have been provided ample opportunity to ask questions regarding this consent and the Services and have had my questions answered to  my satisfaction.  I give my informed consent for the services to be provided through the use of telemedicine in my medical care  By participating in this telemedicine visit I agree to the above.

## 2019-07-14 ENCOUNTER — Ambulatory Visit (INDEPENDENT_AMBULATORY_CARE_PROVIDER_SITE_OTHER): Payer: Medicare Other | Admitting: *Deleted

## 2019-07-14 DIAGNOSIS — R55 Syncope and collapse: Secondary | ICD-10-CM | POA: Diagnosis not present

## 2019-07-16 LAB — CUP PACEART REMOTE DEVICE CHECK
Battery Remaining Longevity: 56 mo
Battery Voltage: 2.99 V
Brady Statistic AP VP Percent: 0.23 %
Brady Statistic AP VS Percent: 82.66 %
Brady Statistic AS VP Percent: 0.06 %
Brady Statistic AS VS Percent: 17.05 %
Brady Statistic RA Percent Paced: 78.38 %
Brady Statistic RV Percent Paced: 0.3 %
Date Time Interrogation Session: 20201007131231
Implantable Lead Implant Date: 20150727
Implantable Lead Implant Date: 20150727
Implantable Lead Location: 753859
Implantable Lead Location: 753860
Implantable Lead Model: 5076
Implantable Lead Model: 5076
Implantable Pulse Generator Implant Date: 20150727
Lead Channel Impedance Value: 399 Ohm
Lead Channel Impedance Value: 475 Ohm
Lead Channel Impedance Value: 513 Ohm
Lead Channel Impedance Value: 532 Ohm
Lead Channel Pacing Threshold Amplitude: 0.625 V
Lead Channel Pacing Threshold Amplitude: 2.5 V
Lead Channel Pacing Threshold Pulse Width: 0.4 ms
Lead Channel Pacing Threshold Pulse Width: 0.4 ms
Lead Channel Sensing Intrinsic Amplitude: 0.25 mV
Lead Channel Sensing Intrinsic Amplitude: 0.25 mV
Lead Channel Sensing Intrinsic Amplitude: 2.375 mV
Lead Channel Sensing Intrinsic Amplitude: 2.375 mV
Lead Channel Setting Pacing Amplitude: 2 V
Lead Channel Setting Pacing Amplitude: 3.5 V
Lead Channel Setting Pacing Pulse Width: 0.4 ms
Lead Channel Setting Sensing Sensitivity: 2 mV

## 2019-07-21 NOTE — Progress Notes (Signed)
Remote pacemaker transmission.   

## 2019-08-08 DIAGNOSIS — I480 Paroxysmal atrial fibrillation: Secondary | ICD-10-CM | POA: Diagnosis not present

## 2019-08-08 DIAGNOSIS — E7800 Pure hypercholesterolemia, unspecified: Secondary | ICD-10-CM | POA: Diagnosis not present

## 2019-09-08 DIAGNOSIS — E7800 Pure hypercholesterolemia, unspecified: Secondary | ICD-10-CM | POA: Diagnosis not present

## 2019-09-08 DIAGNOSIS — I480 Paroxysmal atrial fibrillation: Secondary | ICD-10-CM | POA: Diagnosis not present

## 2019-09-08 DIAGNOSIS — K219 Gastro-esophageal reflux disease without esophagitis: Secondary | ICD-10-CM | POA: Diagnosis not present

## 2019-09-12 ENCOUNTER — Encounter: Payer: PRIVATE HEALTH INSURANCE | Admitting: Internal Medicine

## 2019-10-13 ENCOUNTER — Ambulatory Visit (INDEPENDENT_AMBULATORY_CARE_PROVIDER_SITE_OTHER): Payer: Medicare Other | Admitting: *Deleted

## 2019-10-13 DIAGNOSIS — R55 Syncope and collapse: Secondary | ICD-10-CM

## 2019-10-13 LAB — CUP PACEART REMOTE DEVICE CHECK
Battery Remaining Longevity: 57 mo
Battery Voltage: 2.99 V
Brady Statistic AP VP Percent: 0.56 %
Brady Statistic AP VS Percent: 82.64 %
Brady Statistic AS VP Percent: 0.15 %
Brady Statistic AS VS Percent: 16.64 %
Brady Statistic RA Percent Paced: 78.72 %
Brady Statistic RV Percent Paced: 0.71 %
Date Time Interrogation Session: 20210104111338
Implantable Lead Implant Date: 20150727
Implantable Lead Implant Date: 20150727
Implantable Lead Location: 753859
Implantable Lead Location: 753860
Implantable Lead Model: 5076
Implantable Lead Model: 5076
Implantable Pulse Generator Implant Date: 20150727
Lead Channel Impedance Value: 399 Ohm
Lead Channel Impedance Value: 494 Ohm
Lead Channel Impedance Value: 532 Ohm
Lead Channel Impedance Value: 551 Ohm
Lead Channel Pacing Threshold Amplitude: 0.75 V
Lead Channel Pacing Threshold Amplitude: 2.5 V
Lead Channel Pacing Threshold Pulse Width: 0.4 ms
Lead Channel Pacing Threshold Pulse Width: 0.4 ms
Lead Channel Sensing Intrinsic Amplitude: 2.875 mV
Lead Channel Sensing Intrinsic Amplitude: 2.875 mV
Lead Channel Sensing Intrinsic Amplitude: 3.75 mV
Lead Channel Sensing Intrinsic Amplitude: 3.75 mV
Lead Channel Setting Pacing Amplitude: 2 V
Lead Channel Setting Pacing Amplitude: 3.5 V
Lead Channel Setting Pacing Pulse Width: 0.4 ms
Lead Channel Setting Sensing Sensitivity: 2 mV

## 2019-10-17 ENCOUNTER — Encounter: Payer: PRIVATE HEALTH INSURANCE | Admitting: Internal Medicine

## 2019-10-23 ENCOUNTER — Other Ambulatory Visit: Payer: Self-pay | Admitting: Cardiology

## 2019-10-24 DIAGNOSIS — Z23 Encounter for immunization: Secondary | ICD-10-CM | POA: Diagnosis not present

## 2019-10-30 ENCOUNTER — Telehealth: Payer: Self-pay | Admitting: *Deleted

## 2019-10-30 DIAGNOSIS — I480 Paroxysmal atrial fibrillation: Secondary | ICD-10-CM

## 2019-10-30 DIAGNOSIS — Z79899 Other long term (current) drug therapy: Secondary | ICD-10-CM

## 2019-10-30 NOTE — Telephone Encounter (Signed)
Patient reminded of requested lab work bmet & cbc prior to OV with [employer]. Patient says she will go to [facility] to have lab work done the first week of March 2021. Lab orders faxed to [facility].

## 2019-10-30 NOTE — Telephone Encounter (Signed)
-----   Message from Chanda Busing sent at 10/14/2019 12:20 PM EST ----- Regarding: NEEDS TO HAVE LAB ORDERS MAILED TO PATIENT FOR UPCOMING APPOINTMENT THANKS.

## 2019-11-24 DIAGNOSIS — Z23 Encounter for immunization: Secondary | ICD-10-CM | POA: Diagnosis not present

## 2019-12-04 ENCOUNTER — Telehealth: Payer: Self-pay | Admitting: *Deleted

## 2019-12-04 DIAGNOSIS — I480 Paroxysmal atrial fibrillation: Secondary | ICD-10-CM | POA: Diagnosis not present

## 2019-12-04 DIAGNOSIS — Z79899 Other long term (current) drug therapy: Secondary | ICD-10-CM | POA: Diagnosis not present

## 2019-12-04 NOTE — Telephone Encounter (Signed)
-----   Message from Erma Heritage, [place] sent at 12/04/2019 10:35 AM EST ----- Covering for Dr. Domenic Polite - Please let the patient know her hemoglobin and platelets are within a normal range. Kidney function and electrolytes remain stable. Continue current medication regimen. Please forward a copy of labs to Rory Percy, MD.

## 2019-12-05 NOTE — Telephone Encounter (Signed)
Husband Tiffany Melton) notified & verbalized understanding.

## 2019-12-05 NOTE — Telephone Encounter (Signed)
Patient is returning call requesting results from lab work completed on yesterday, 12/04/19. Please call to discuss.

## 2019-12-08 ENCOUNTER — Telehealth: Payer: Self-pay | Admitting: *Deleted

## 2019-12-08 NOTE — Telephone Encounter (Signed)
Patient informed. Copy sent to PCP °

## 2019-12-08 NOTE — Telephone Encounter (Signed)
-----   Message from Erma Heritage, [place] sent at 12/04/2019 10:35 AM EST ----- Covering for Dr. Domenic Polite - Please let the patient know her hemoglobin and platelets are within a normal range. Kidney function and electrolytes remain stable. Continue current medication regimen. Please forward a copy of labs to Rory Percy, MD.

## 2019-12-12 ENCOUNTER — Telehealth (INDEPENDENT_AMBULATORY_CARE_PROVIDER_SITE_OTHER): Payer: Medicare Other | Admitting: Internal Medicine

## 2019-12-12 ENCOUNTER — Encounter: Payer: Self-pay | Admitting: Internal Medicine

## 2019-12-12 VITALS — BP 115/83 | HR 90 | Ht 69.0 in | Wt 184.0 lb

## 2019-12-12 DIAGNOSIS — R079 Chest pain, unspecified: Secondary | ICD-10-CM | POA: Diagnosis not present

## 2019-12-12 DIAGNOSIS — I495 Sick sinus syndrome: Secondary | ICD-10-CM | POA: Diagnosis not present

## 2019-12-12 DIAGNOSIS — I480 Paroxysmal atrial fibrillation: Secondary | ICD-10-CM | POA: Diagnosis not present

## 2019-12-12 MED ORDER — DILTIAZEM HCL 30 MG PO TABS: 30.0000 mg | ORAL_TABLET | ORAL | 1 refills | Status: AC | PRN

## 2019-12-12 MED ORDER — SIMVASTATIN 10 MG PO TABS: 10.0000 mg | ORAL_TABLET | Freq: Every day | ORAL | 3 refills | Status: DC

## 2019-12-12 NOTE — Progress Notes (Signed)
Electrophysiology TeleHealth Note  Due to national recommendations of social distancing due to [place] 19, an audio telehealth visit is felt to be most appropriate for this patient at this time.  Verbal consent was obtained by me for the telehealth visit today.  The patient does not have capability for a virtual visit.  A phone visit is therefore required today.   Date:  12/12/2019   ID:  Tiffany Melton, DOB 10/15/1950, MRN [id number]  Location: patient's home  Provider location:  [place]  Evaluation Performed: Follow-up visit  PCP:  Rory Percy, MD   Electrophysiologist:  Dr [name]  Chief Complaint:  palpitations  History of Present Illness:    Tiffany Melton is a 69 y.o. female who presents via telehealth conferencing today.  Since last being seen in our clinic, the patient reports doing very well.  Today, she denies symptoms of palpitations,  lower extremity edema, dizziness, presyncope, or syncope. She has occasional exertional SOB and chest discomfort.  She has discussed this with Dr Domenic Polite (his note 01/28/2019 is reviewed).  The patient is otherwise without complaint today.     Past Medical History:  Diagnosis Date  . Abnormal stress test    GXT 9/13, cath revealed normal coronary arteries  . Paroxysmal atrial fibrillation (HCC)   . Raynaud's disease   . Sick sinus syndrome [facility])     Past Surgical History:  Procedure Laterality Date  . BACK SURGERY    . CARDIAC CATHETERIZATION  2013  . CHOLECYSTECTOMY    . PACEMAKER INSERTION  05-04-14   MDT Advisa dual chamber pacemaker implanted by Dr Caryl Comes for SSS, atrial fibrillation post termination pauses  . PERMANENT PACEMAKER INSERTION N/A 05/04/2014   Procedure: PERMANENT PACEMAKER INSERTION;  Surgeon: Deboraha Sprang, MD;  Location: [facility] CATH LAB;  Service: Cardiovascular;  Laterality: N/A;  . TONSILLECTOMY      Current Outpatient Medications  Medication Sig Dispense Refill  . diltiazem (CARDIZEM CD) 180 MG 24 hr  capsule Take 1 capsule (180 mg total) by mouth daily. 90 capsule 3  . diltiazem (CARDIZEM) 30 MG tablet Take 1 tablet (30 mg total) by mouth as needed. 30 tablet 1  . ELIQUIS 5 MG TABS tablet TAKE 1 TABLET TWICE A DAY  (NEED OFFICE VISIT WITH    [place]) 180 tablet 3  . pantoprazole (PROTONIX) 20 MG tablet Take 1 tablet (20 mg total) by mouth daily. 90 tablet 3  . simvastatin (ZOCOR) 10 MG tablet Take 1 tablet (10 mg total) by mouth daily. 90 tablet 3   No current facility-administered medications for this visit.    Allergies:   Patient has no known allergies.   Social History:  The patient  reports that she quit smoking about 37 years ago. Her smoking use included cigarettes. She started smoking about 57 years ago. She has a 35.00 pack-year smoking history. She has never used smokeless tobacco. She reports that she does not drink alcohol or use drugs.   Family History:  The patient's family history includes Hypertension in an other family member.   ROS:  Please see the history of present illness.   All other systems are personally reviewed and negative.    Exam:    Vital Signs:  BP 115/83   Pulse 90   Ht 5\' 9"  (1.753 m)   Wt 184 lb (83.5 kg)   BMI 27.17 kg/m   Well sounding, alert and conversant   Labs/Other Tests and Data Reviewed:  Recent Labs: No results found for requested labs within last 8760 hours.   Wt Readings from Last 3 Encounters:  12/12/19 184 lb (83.5 kg)  06/02/19 183 lb (83 kg)  02/06/19 187 lb (84.8 kg)     Last device remote is reviewed from [facility] [employer] which reveals normal device function, afib is well controlled (afib/ atrial flutter burden is 1.8)  ASSESSMENT & PLAN:    1.  Sick sinus syndrome Remotes are up to date Normal device function Histogram distrubution looks appropriate for her activity level.  I do not feel that pacing is causing her SOB/ chest discomfort. RV threshold is chronically elevated, does not RV pace  2. Overweight  Lifestyle modification is encouraged  3. Paroxysmal atrial fibrillation afib burden is 1.8 % (previously 0.9%) chads2vasc score is at least 2 She is on eliquis She appears to be asymptomatic.  I do not feel that this is the cause for her frequent SOB and exertional chest discomfort. Dr Domenic Polite previously increased diltiazem for better rate control.  Her V rates during AF do increase though her overall AF burden is low.  As her AF burden increases, we may need to increase diltiazem further.  4. ? Snoring Previously told that she may have sleep apnea during foot surgery Previously offered her a sleep study which she declined  5. Chest discomfort, SOB with moderate activity She attributes to anxiety Stable over the past year Does not appear to be due to her pacemaker programming as her histograms appear appropriate.  Does not seem to be secondary to afib.   Previously normal cath in 2013.  She has discussed with Dr Domenic Polite previously.  I did offer stress testing which she declines today.  She will discuss further on scheduled follow-up with Dr Domenic Polite  Follow-up:  with me in a year Follow-up with Dr Domenic Polite as scheduled  Patient Risk:  after full review of this patients clinical status, I feel that they are at moderate risk at this time.  Today, I have spent 15 minutes with the patient with telehealth technology discussing arrhythmia management .    Army Fossa, MD  12/12/2019 12:30 PM     [place] [address] [phone] (office) [phone] (fax)

## 2019-12-22 ENCOUNTER — Ambulatory Visit: Payer: Medicare Other | Admitting: Cardiology

## 2019-12-22 NOTE — Progress Notes (Deleted)
Cardiology Office Note  Date: 12/22/2019   ID: Tiffany Melton, DOB 1951-03-28, MRN [id number]  PCP:  Rory Percy, MD  Cardiologist:  Rozann Lesches, MD Electrophysiologist:  Thompson Grayer, MD   No chief complaint on file.   History of Present Illness: Tiffany Melton is a 70 y.o. female last assessed via telehealth encounter in August 2020.  She sees Dr. [name] in the device clinic, Medtronic pacemaker in place. Device interrogation in January showed 2% AF burden, longest episode lasting 6 hours.  RVR approximately 50% of the time in atrial fibrillation.  Past Medical History:  Diagnosis Date  . Abnormal stress test    GXT 9/13, cath revealed normal coronary arteries  . Paroxysmal atrial fibrillation (HCC)   . Raynaud's disease   . Sick sinus syndrome [facility])     Past Surgical History:  Procedure Laterality Date  . BACK SURGERY    . CARDIAC CATHETERIZATION  2013  . CHOLECYSTECTOMY    . PACEMAKER INSERTION  05-04-14   MDT Advisa dual chamber pacemaker implanted by Dr Caryl Comes for SSS, atrial fibrillation post termination pauses  . PERMANENT PACEMAKER INSERTION N/A 05/04/2014   Procedure: PERMANENT PACEMAKER INSERTION;  Surgeon: Deboraha Sprang, MD;  Location: [facility] CATH LAB;  Service: Cardiovascular;  Laterality: N/A;  . TONSILLECTOMY      Current Outpatient Medications  Medication Sig Dispense Refill  . diltiazem (CARDIZEM CD) 180 MG 24 hr capsule Take 1 capsule (180 mg total) by mouth daily. 90 capsule 3  . diltiazem (CARDIZEM) 30 MG tablet Take 1 tablet (30 mg total) by mouth as needed. 30 tablet 1  . ELIQUIS 5 MG TABS tablet TAKE 1 TABLET TWICE A DAY  (NEED OFFICE VISIT WITH    ) 180 tablet 3  . pantoprazole (PROTONIX) 20 MG tablet Take 1 tablet (20 mg total) by mouth daily. 90 tablet 3  . simvastatin (ZOCOR) 10 MG tablet Take 1 tablet (10 mg total) by mouth daily. 90 tablet 3   No current facility-administered medications for this visit.   Allergies:  Patient has no  known allergies.   Social History: The patient  reports that she quit smoking about 37 years ago. Her smoking use included cigarettes. She started smoking about 57 years ago. She has a 35.00 pack-year smoking history. She has never used smokeless tobacco. She reports that she does not drink alcohol or use drugs.   Family History: The patient's family history includes Hypertension in an other family member.   ROS:  Please see the history of present illness. Otherwise, complete review of systems is positive for {NONE DEFAULTED:18576::"none"}.  All other systems are reviewed and negative.   Physical Exam: VS:  There were no vitals taken for this visit., BMI There is no height or weight on file to calculate BMI.  Wt Readings from Last 3 Encounters:  12/12/19 184 lb (83.5 kg)  06/02/19 183 lb (83 kg)  02/06/19 187 lb (84.8 kg)    General: Patient appears comfortable at rest. HEENT: Conjunctiva and lids normal, oropharynx clear with moist mucosa. Neck: Supple, no elevated JVP or carotid bruits, no thyromegaly. Lungs: Clear to auscultation, nonlabored breathing at rest. Cardiac: Regular rate and rhythm, no S3 or significant systolic murmur, no pericardial rub. Abdomen: Soft, nontender, no hepatomegaly, bowel sounds present, no guarding or rebound. Extremities: No pitting edema, distal pulses 2+. Skin: Warm and dry. Musculoskeletal: No kyphosis. Neuropsychiatric: Alert and oriented x3, affect grossly appropriate.  ECG:  An ECG dated  02/06/2019 was personally reviewed today and demonstrated:  Atrial paced rhythm with frequent PVCs.  Recent Labwork:  February 2021: BUN 19, creatinine 0.7, potassium 4.3.5, platelets 203  Other Studies Reviewed Today:  Echocardiogram 02/06/2019: 1. The left ventricle has hyperdynamic systolic function, with an ejection fraction of >65%. The cavity size was normal. Left ventricular diastolic parameters were normal. No evidence of left ventricular regional wall  motion abnormalities. 2. The right ventricle has normal systolic function. The cavity was normal. There is no increase in right ventricular wall thickness. Right ventricular systolic pressure normal with an estimated pressure of 22.9 mmHg. 3. The pericardial effusion is posterior to the left ventricle. 4. Trivial pericardial effusion is present. 5. The aortic valve is tricuspid. Mild calcification of the aortic valve. Mild aortic annular calcification noted. 6. The mitral valve is grossly normal. Mild thickening of the mitral valve leaflet. 7. The tricuspid valve is grossly normal. 8. The aortic root is normal in size and structure.  Assessment and Plan:    Medication Adjustments/Labs and Tests Ordered: Current medicines are reviewed at length with the patient today.  Concerns regarding medicines are outlined above.   Tests Ordered: No orders of the defined types were placed in this encounter.   Medication Changes: No orders of the defined types were placed in this encounter.   Disposition:  Follow up {follow up:15908}  Signed, Satira Sark, MD, [facility] 12/22/2019 12:53 PM    [address] [facility] [address], [place], [place] [address] Phone: [phone]; Fax: [phone]

## 2020-01-12 ENCOUNTER — Ambulatory Visit (INDEPENDENT_AMBULATORY_CARE_PROVIDER_SITE_OTHER): Payer: Medicare Other | Admitting: *Deleted

## 2020-01-12 ENCOUNTER — Telehealth: Payer: Self-pay

## 2020-01-12 DIAGNOSIS — R55 Syncope and collapse: Secondary | ICD-10-CM | POA: Diagnosis not present

## 2020-01-12 LAB — CUP PACEART REMOTE DEVICE CHECK
Battery Remaining Longevity: 51 mo
Battery Voltage: 2.99 V
Brady Statistic AP VP Percent: 1.3 %
Brady Statistic AP VS Percent: 82.58 %
Brady Statistic AS VP Percent: 0.22 %
Brady Statistic AS VS Percent: 15.9 %
Brady Statistic RA Percent Paced: 78.51 %
Brady Statistic RV Percent Paced: 1.5 %
Date Time Interrogation Session: 20210405092320
Implantable Lead Implant Date: 20150727
Implantable Lead Implant Date: 20150727
Implantable Lead Location: 753859
Implantable Lead Location: 753860
Implantable Lead Model: 5076
Implantable Lead Model: 5076
Implantable Pulse Generator Implant Date: 20150727
Lead Channel Impedance Value: 361 Ohm
Lead Channel Impedance Value: 494 Ohm
Lead Channel Impedance Value: 494 Ohm
Lead Channel Impedance Value: 551 Ohm
Lead Channel Pacing Threshold Amplitude: 0.75 V
Lead Channel Pacing Threshold Amplitude: 2.5 V
Lead Channel Pacing Threshold Pulse Width: 0.4 ms
Lead Channel Pacing Threshold Pulse Width: 0.4 ms
Lead Channel Sensing Intrinsic Amplitude: 1.375 mV
Lead Channel Sensing Intrinsic Amplitude: 1.375 mV
Lead Channel Sensing Intrinsic Amplitude: 5.125 mV
Lead Channel Sensing Intrinsic Amplitude: 5.125 mV
Lead Channel Setting Pacing Amplitude: 2 V
Lead Channel Setting Pacing Amplitude: 3.5 V
Lead Channel Setting Pacing Pulse Width: 0.4 ms
Lead Channel Setting Sensing Sensitivity: 2 mV

## 2020-01-12 NOTE — Telephone Encounter (Signed)
PM transmission received- Pt in AF with RVR ongoing.  Recent increase in AF burden from 1.8 to 2.3%.  Current meds include Eliquis for OAC and Diltiazem 180mg  daily with 30mg  PRN dose.  At last telemed visit, MD noted plan to possibly increase diltiazem if AF burden increase or change in pt symptoms.  Since that visit, this is the 2nd episode of symptomatic AF that has occurred the last one was on 3/20, lasting nearly 7 hours.    Spoke with pt, she awoke in in middle of night with some chest pain that has resolved.  She reports she has been feeling very tired all day, not really able to do much.  She is currently feeling a little better than she woke up but still not back to normal.  Advised could send manual transmission now to see if she was still in AF, pt indicates she felt like she was based on how she was feeling.  Pt had not yet taken PRN dose of Diltiazem, advised her she should do this now.  D/w other options for better AF management, she was agreeable to AF clinic as she does not like feeling this way and it appears to be increasing in frequency.

## 2020-01-12 NOTE — Telephone Encounter (Signed)
Called and offered appt to patient for 01/13/20.  Pt states that she is feeling better and would like to wait.  Advised pt to keep appt with Dr. Domenic Polite at the end of the month and if she begins feeling like this again soon to call our office to make an appt, either in-person or Virtual per Rushie Goltz, RN.  Pt agreeable and voiced understanding.

## 2020-01-13 NOTE — Telephone Encounter (Signed)
Assited pt with manual transmission to determine if still in AF.  Pt indicates she is feeling better today just fatigued.  Transmission received and reviewed.  Pt back to AP-VS rhythm.  Pt indicates she did take a PRN diltiazem today as she was unsure if HR was back in rhythm.      It appears current episode ended around 10 am yesterday.

## 2020-01-13 NOTE — Progress Notes (Signed)
PPM Remote  

## 2020-02-04 ENCOUNTER — Ambulatory Visit (INDEPENDENT_AMBULATORY_CARE_PROVIDER_SITE_OTHER): Payer: Medicare Other | Admitting: Cardiology

## 2020-02-04 ENCOUNTER — Encounter: Payer: Self-pay | Admitting: *Deleted

## 2020-02-04 ENCOUNTER — Telehealth: Payer: Self-pay | Admitting: Cardiology

## 2020-02-04 ENCOUNTER — Encounter: Payer: Self-pay | Admitting: Cardiology

## 2020-02-04 ENCOUNTER — Other Ambulatory Visit: Payer: Self-pay

## 2020-02-04 VITALS — BP 124/78 | HR 79 | Ht 68.0 in | Wt 190.0 lb

## 2020-02-04 DIAGNOSIS — I480 Paroxysmal atrial fibrillation: Secondary | ICD-10-CM

## 2020-02-04 DIAGNOSIS — R0609 Other forms of dyspnea: Secondary | ICD-10-CM

## 2020-02-04 DIAGNOSIS — R0600 Dyspnea, unspecified: Secondary | ICD-10-CM

## 2020-02-04 DIAGNOSIS — I493 Ventricular premature depolarization: Secondary | ICD-10-CM

## 2020-02-04 MED ORDER — DILTIAZEM HCL ER COATED BEADS 240 MG PO CP24: 240.0000 mg | ORAL_CAPSULE | Freq: Every day | ORAL | 2 refills | Status: DC

## 2020-02-04 NOTE — Patient Instructions (Signed)
Medication Instructions:   Your physician has recommended you make the following change in your medication:   Increase cardizem cd (diltiazem) to 240 mg by mouth daily  Continue other medications the same  Labwork:  NONE  Testing/Procedures: Your physician has requested that you have a lexiscan myoview. For further information please visit [email or web address]. Please follow instruction sheet, as given.  Follow-Up:  Your physician recommends that you schedule a follow-up appointment in: 6 months (office).   Any Other Special Instructions Will Be Listed Below (If Applicable).  If you need a refill on your cardiac medications before your next appointment, please call your pharmacy.

## 2020-02-04 NOTE — Telephone Encounter (Signed)
  Precert needed for: Lexiscan Myoview dx: DOE   Location: [facility]    Date: Feb 09, 2020

## 2020-02-04 NOTE — Progress Notes (Signed)
Cardiology Office Note  Date: 02/04/2020   ID: [name], DOB 12-20-1950, MRN [id number]  PCP:  Rory Percy, MD  Cardiologist:  Rozann Lesches, MD Electrophysiologist:  Thompson Grayer, MD   Chief Complaint  Patient presents with  . Cardiac follow-up    History of Present Illness: Tiffany Melton is a 69 y.o. female last assessed via telehealth encounter in August 2020.  She presents for a routine visit.  Reports intermittent palpitations as before, also still experiencing dyspnea on exertion and chest tightness.  The exertional symptoms are not necessarily always associated with palpitations however.  Previous cardiac work-up included finding of normal coronary arteries at cardiac catheterization in 2013.  She has not had interval ischemic testing.  She sees Dr. [name], Medtronic pacemaker in place.  I reviewed her recent device interrogation as monitored by Dr. [name].  She does have breakthrough episodes of atrial fibrillation with RVR, in fact was out of rhythm with her last device check.  I reviewed her recent lab work as outlined below.  We also went over her medications and discussed increasing Cardizem CD to 240 mg daily.  I personally reviewed her ECG today which shows an atrial paced rhythm.  Past Medical History:  Diagnosis Date  . Abnormal stress test    GXT 9/13, cath revealed normal coronary arteries  . Paroxysmal atrial fibrillation (HCC)   . Raynaud's disease   . Sick sinus syndrome [facility])     Past Surgical History:  Procedure Laterality Date  . BACK SURGERY    . CARDIAC CATHETERIZATION  2013  . CHOLECYSTECTOMY    . PACEMAKER INSERTION  05-04-14   MDT Advisa dual chamber pacemaker implanted by Dr Caryl Comes for SSS, atrial fibrillation post termination pauses  . PERMANENT PACEMAKER INSERTION N/A 05/04/2014   Procedure: PERMANENT PACEMAKER INSERTION;  Surgeon: Deboraha Sprang, MD;  Location: [facility] CATH LAB;  Service: Cardiovascular;  Laterality: N/A;  . TONSILLECTOMY       Current Outpatient Medications  Medication Sig Dispense Refill  . diltiazem (CARDIZEM) 30 MG tablet Take 1 tablet (30 mg total) by mouth as needed. 30 tablet 1  . ELIQUIS 5 MG TABS tablet TAKE 1 TABLET TWICE A DAY  (NEED OFFICE VISIT WITH    Cimberly Stoffel) 180 tablet 3  . pantoprazole (PROTONIX) 20 MG tablet Take 1 tablet (20 mg total) by mouth daily. 90 tablet 3  . simvastatin (ZOCOR) 10 MG tablet Take 1 tablet (10 mg total) by mouth daily. 90 tablet 3  . diltiazem (CARDIZEM CD) 240 MG 24 hr capsule Take 1 capsule (240 mg total) by mouth daily. 90 capsule 2   No current facility-administered medications for this visit.   Allergies:  Patient has no known allergies.   ROS:  No syncope.  Physical Exam: VS:  BP 124/78   Pulse 79   Ht 5\' 8"  (1.727 m)   Wt 190 lb (86.2 kg)   SpO2 95%   BMI 28.89 kg/m , BMI Body mass index is 28.89 kg/m.  Wt Readings from Last 3 Encounters:  02/04/20 190 lb (86.2 kg)  12/12/19 184 lb (83.5 kg)  06/02/19 183 lb (83 kg)    General: Patient appears comfortable at rest. HEENT: Conjunctiva and lids normal, wearing a mask. Neck: Supple, no elevated JVP or carotid bruits, no thyromegaly. Lungs: Clear to auscultation, nonlabored breathing at rest. Cardiac: Regular rate and rhythm, no S3 or significant systolic murmur, no pericardial rub. Extremities: No pitting edema, distal pulses 2+.  ECG:  An ECG dated 02/06/2019 was personally reviewed today and demonstrated:  Atrial paced rhythm with PVCs likely originating from the outflow tract.  Recent Labwork:  February 2021: Hemoglobin 12.5, platelets 205, BUN 19, creatinine 0.7, potassium 4.3  Other Studies Reviewed Today:  Echocardiogram 02/06/2019: 1. The left ventricle has hyperdynamic systolic function, with an ejection fraction of >65%. The cavity size was normal. Left ventricular diastolic parameters were normal. No evidence of left ventricular regional wall motion abnormalities. 2. The right  ventricle has normal systolic function. The cavity was normal. There is no increase in right ventricular wall thickness. Right ventricular systolic pressure normal with an estimated pressure of 22.9 mmHg. 3. The pericardial effusion is posterior to the left ventricle. 4. Trivial pericardial effusion is present. 5. The aortic valve is tricuspid. Mild calcification of the aortic valve. Mild aortic annular calcification noted. 6. The mitral valve is grossly normal. Mild thickening of the mitral valve leaflet. 7. The tricuspid valve is grossly normal. 8. The aortic root is normal in size and structure.  Assessment and Plan:  1.  Paroxysmal atrial fibrillation, CHA2DS2-VASc score is 2.  She continues on Eliquis for stroke prophylaxis, reports no bleeding problems.  I reviewed her recent lab work.  She does have breakthrough episodes of atrial fibrillation with RVR and has been symptomatic with this.  We will increase Cardizem CD to 240 mg daily for now.  2.  Exertional dyspnea and chest tightness, not necessarily associated with palpitations.  She has a history of normal coronary arteries as of 2013, no interval cardiac ischemic testing.  We will arrange a Lexiscan Myoview.  Echocardiogram from April of last year showed vigorous LVEF and no major valvular abnormalities.  3.  Sick sinus syndrome, Medtronic pacemaker in place with follow-up by Dr. [name].  Medication Adjustments/Labs and Tests Ordered: Current medicines are reviewed at length with the patient today.  Concerns regarding medicines are outlined above.   Tests Ordered: Orders Placed This Encounter  Procedures  . NM Myocar Multi W/Spect W/Wall Motion / EF  . EKG 12-Lead    Medication Changes: Meds ordered this encounter  Medications  . diltiazem (CARDIZEM CD) 240 MG 24 hr capsule    Sig: Take 1 capsule (240 mg total) by mouth daily.    Dispense:  90 capsule    Refill:  2    02/04/2020 dose increase    Disposition:   Follow up 6 months in the [place] office.  Signed, Satira Sark, MD, [facility] 02/04/2020 10:17 AM    [facility] [address], [place], [place] [address] Phone: [phone]; Fax: [phone]

## 2020-02-09 ENCOUNTER — Encounter (HOSPITAL_COMMUNITY): Payer: Self-pay

## 2020-02-09 ENCOUNTER — Encounter (HOSPITAL_COMMUNITY)
Admission: RE | Admit: 2020-02-09 | Discharge: 2020-02-09 | Disposition: A | Discharge status: Home / Self Care | Payer: Medicare Other | Source: Ambulatory Visit | Attending: Cardiology | Admitting: Cardiology

## 2020-02-09 ENCOUNTER — Other Ambulatory Visit: Payer: Self-pay

## 2020-02-09 ENCOUNTER — Encounter (HOSPITAL_BASED_OUTPATIENT_CLINIC_OR_DEPARTMENT_OTHER)
Admission: RE | Admit: 2020-02-09 | Discharge: 2020-02-09 | Disposition: A | Discharge status: Home / Self Care | Payer: Medicare Other | Source: Ambulatory Visit | Attending: Cardiology | Admitting: Cardiology

## 2020-02-09 DIAGNOSIS — R0600 Dyspnea, unspecified: Secondary | ICD-10-CM

## 2020-02-09 DIAGNOSIS — R0609 Other forms of dyspnea: Secondary | ICD-10-CM

## 2020-02-09 LAB — NM MYOCAR MULTI W/SPECT W/WALL MOTION / EF
LV dias vol: 79 mL (ref 46–106)
LV sys vol: 25 mL
Peak HR: 67 {beats}/min
RATE: 0.65
Rest HR: 65 {beats}/min
SDS: 3
SRS: 1
SSS: 4
TID: 1.12

## 2020-02-09 MED ORDER — TECHNETIUM TC 99M TETROFOSMIN IV KIT
10.0000 | PACK | Freq: Once | INTRAVENOUS | Status: AC | PRN
  Administered 2020-02-09: 07:00:00 10.22 via INTRAVENOUS

## 2020-02-09 MED ORDER — REGADENOSON 0.4 MG/5ML IV SOLN
INTRAVENOUS | Status: AC
  Administered 2020-02-09: 09:00:00 0.4 mg via INTRAVENOUS
  Filled 2020-02-09: qty 5

## 2020-02-09 MED ORDER — SODIUM CHLORIDE FLUSH 0.9 % IV SOLN
INTRAVENOUS | Status: AC
  Administered 2020-02-09: 09:00:00 10 mL via INTRAVENOUS
  Filled 2020-02-09: qty 10

## 2020-02-09 MED ORDER — TECHNETIUM TC 99M TETROFOSMIN IV KIT
30.0000 | PACK | Freq: Once | INTRAVENOUS | Status: AC | PRN
  Administered 2020-02-09: 09:00:00 32 via INTRAVENOUS

## 2020-02-11 ENCOUNTER — Telehealth: Payer: Self-pay | Admitting: *Deleted

## 2020-02-11 NOTE — Telephone Encounter (Signed)
-----   Message from Satira Sark, MD sent at 02/09/2020  3:32 PM EDT ----- Results reviewed.  Please let her know that the stress test looked good, no ischemic defects to suggest development of obstructive CAD, low risk.  Keep current follow-up plan.

## 2020-02-11 NOTE — Telephone Encounter (Signed)
Patient informed. Copy sent to PCP °

## 2020-04-13 ENCOUNTER — Ambulatory Visit (INDEPENDENT_AMBULATORY_CARE_PROVIDER_SITE_OTHER): Payer: Medicare Other | Admitting: *Deleted

## 2020-04-13 DIAGNOSIS — I480 Paroxysmal atrial fibrillation: Secondary | ICD-10-CM | POA: Diagnosis not present

## 2020-04-14 LAB — CUP PACEART REMOTE DEVICE CHECK
Battery Remaining Longevity: 49 mo
Battery Voltage: 2.98 V
Brady Statistic AP VP Percent: 1.43 %
Brady Statistic AP VS Percent: 82.82 %
Brady Statistic AS VP Percent: 0.34 %
Brady Statistic AS VS Percent: 15.41 %
Brady Statistic RA Percent Paced: 79.26 %
Brady Statistic RV Percent Paced: 1.74 %
Date Time Interrogation Session: 20210707122507
Implantable Lead Implant Date: 20150727
Implantable Lead Implant Date: 20150727
Implantable Lead Location: 753859
Implantable Lead Location: 753860
Implantable Lead Model: 5076
Implantable Lead Model: 5076
Implantable Pulse Generator Implant Date: 20150727
Lead Channel Impedance Value: 380 Ohm
Lead Channel Impedance Value: 494 Ohm
Lead Channel Impedance Value: 513 Ohm
Lead Channel Impedance Value: 551 Ohm
Lead Channel Pacing Threshold Amplitude: 0.625 V
Lead Channel Pacing Threshold Amplitude: 1.875 V
Lead Channel Pacing Threshold Pulse Width: 0.4 ms
Lead Channel Pacing Threshold Pulse Width: 0.4 ms
Lead Channel Sensing Intrinsic Amplitude: 2.5 mV
Lead Channel Sensing Intrinsic Amplitude: 2.5 mV
Lead Channel Sensing Intrinsic Amplitude: 2.75 mV
Lead Channel Sensing Intrinsic Amplitude: 2.75 mV
Lead Channel Setting Pacing Amplitude: 2 V
Lead Channel Setting Pacing Amplitude: 3.5 V
Lead Channel Setting Pacing Pulse Width: 0.4 ms
Lead Channel Setting Sensing Sensitivity: 2 mV

## 2020-04-15 NOTE — Progress Notes (Signed)
Remote pacemaker transmission.   

## 2020-05-05 ENCOUNTER — Other Ambulatory Visit: Payer: Self-pay | Admitting: Cardiology

## 2020-05-07 DIAGNOSIS — E7849 Other hyperlipidemia: Secondary | ICD-10-CM | POA: Diagnosis not present

## 2020-05-07 DIAGNOSIS — K219 Gastro-esophageal reflux disease without esophagitis: Secondary | ICD-10-CM | POA: Diagnosis not present

## 2020-05-07 DIAGNOSIS — I495 Sick sinus syndrome: Secondary | ICD-10-CM | POA: Diagnosis not present

## 2020-05-07 DIAGNOSIS — I480 Paroxysmal atrial fibrillation: Secondary | ICD-10-CM | POA: Diagnosis not present

## 2020-05-24 DIAGNOSIS — G4730 Sleep apnea, unspecified: Secondary | ICD-10-CM | POA: Diagnosis not present

## 2020-05-24 DIAGNOSIS — R5382 Chronic fatigue, unspecified: Secondary | ICD-10-CM | POA: Diagnosis not present

## 2020-05-24 DIAGNOSIS — E559 Vitamin D deficiency, unspecified: Secondary | ICD-10-CM | POA: Diagnosis not present

## 2020-05-24 DIAGNOSIS — Z6829 Body mass index (BMI) 29.0-29.9, adult: Secondary | ICD-10-CM | POA: Diagnosis not present

## 2020-05-24 DIAGNOSIS — Z0000 Encounter for general adult medical examination without abnormal findings: Secondary | ICD-10-CM | POA: Diagnosis not present

## 2020-05-24 DIAGNOSIS — Z950 Presence of cardiac pacemaker: Secondary | ICD-10-CM | POA: Diagnosis not present

## 2020-05-24 DIAGNOSIS — I480 Paroxysmal atrial fibrillation: Secondary | ICD-10-CM | POA: Diagnosis not present

## 2020-05-24 DIAGNOSIS — E7800 Pure hypercholesterolemia, unspecified: Secondary | ICD-10-CM | POA: Diagnosis not present

## 2020-05-24 DIAGNOSIS — K219 Gastro-esophageal reflux disease without esophagitis: Secondary | ICD-10-CM | POA: Diagnosis not present

## 2020-05-26 DIAGNOSIS — Z6828 Body mass index (BMI) 28.0-28.9, adult: Secondary | ICD-10-CM | POA: Diagnosis not present

## 2020-05-26 DIAGNOSIS — I495 Sick sinus syndrome: Secondary | ICD-10-CM | POA: Diagnosis not present

## 2020-05-26 DIAGNOSIS — I10 Essential (primary) hypertension: Secondary | ICD-10-CM | POA: Diagnosis not present

## 2020-05-26 DIAGNOSIS — I4891 Unspecified atrial fibrillation: Secondary | ICD-10-CM | POA: Diagnosis not present

## 2020-05-26 DIAGNOSIS — E7800 Pure hypercholesterolemia, unspecified: Secondary | ICD-10-CM | POA: Diagnosis not present

## 2020-06-18 DIAGNOSIS — Z1231 Encounter for screening mammogram for malignant neoplasm of breast: Secondary | ICD-10-CM | POA: Diagnosis not present

## 2020-07-05 DIAGNOSIS — Z20828 Contact with and (suspected) exposure to other viral communicable diseases: Secondary | ICD-10-CM | POA: Diagnosis not present

## 2020-07-05 DIAGNOSIS — J029 Acute pharyngitis, unspecified: Secondary | ICD-10-CM | POA: Diagnosis not present

## 2020-07-08 DIAGNOSIS — E7849 Other hyperlipidemia: Secondary | ICD-10-CM | POA: Diagnosis not present

## 2020-07-08 DIAGNOSIS — K219 Gastro-esophageal reflux disease without esophagitis: Secondary | ICD-10-CM | POA: Diagnosis not present

## 2020-07-08 DIAGNOSIS — I495 Sick sinus syndrome: Secondary | ICD-10-CM | POA: Diagnosis not present

## 2020-07-08 DIAGNOSIS — I480 Paroxysmal atrial fibrillation: Secondary | ICD-10-CM | POA: Diagnosis not present

## 2020-07-12 ENCOUNTER — Ambulatory Visit (INDEPENDENT_AMBULATORY_CARE_PROVIDER_SITE_OTHER): Payer: Medicare Other

## 2020-07-12 DIAGNOSIS — I495 Sick sinus syndrome: Secondary | ICD-10-CM | POA: Diagnosis not present

## 2020-07-14 DIAGNOSIS — R922 Inconclusive mammogram: Secondary | ICD-10-CM | POA: Diagnosis not present

## 2020-07-14 DIAGNOSIS — N6320 Unspecified lump in the left breast, unspecified quadrant: Secondary | ICD-10-CM | POA: Diagnosis not present

## 2020-07-14 DIAGNOSIS — R928 Other abnormal and inconclusive findings on diagnostic imaging of breast: Secondary | ICD-10-CM | POA: Diagnosis not present

## 2020-07-15 ENCOUNTER — Telehealth: Payer: Self-pay | Admitting: Internal Medicine

## 2020-07-15 DIAGNOSIS — C44622 Squamous cell carcinoma of skin of right upper limb, including shoulder: Secondary | ICD-10-CM | POA: Diagnosis not present

## 2020-07-15 DIAGNOSIS — L728 Other follicular cysts of the skin and subcutaneous tissue: Secondary | ICD-10-CM | POA: Diagnosis not present

## 2020-07-15 DIAGNOSIS — L821 Other seborrheic keratosis: Secondary | ICD-10-CM | POA: Diagnosis not present

## 2020-07-15 LAB — CUP PACEART REMOTE DEVICE CHECK
Battery Remaining Longevity: 46 mo
Battery Voltage: 2.98 V
Brady Statistic AP VP Percent: 1.42 %
Brady Statistic AP VS Percent: 82.05 %
Brady Statistic AS VP Percent: 0.53 %
Brady Statistic AS VS Percent: 16 %
Brady Statistic RA Percent Paced: 80.14 %
Brady Statistic RV Percent Paced: 1.94 %
Date Time Interrogation Session: 20211007104752
Implantable Lead Implant Date: 20150727
Implantable Lead Implant Date: 20150727
Implantable Lead Location: 753859
Implantable Lead Location: 753860
Implantable Lead Model: 5076
Implantable Lead Model: 5076
Implantable Pulse Generator Implant Date: 20150727
Lead Channel Impedance Value: 380 Ohm
Lead Channel Impedance Value: 532 Ohm
Lead Channel Impedance Value: 532 Ohm
Lead Channel Impedance Value: 589 Ohm
Lead Channel Pacing Threshold Amplitude: 0.75 V
Lead Channel Pacing Threshold Amplitude: 2.375 V
Lead Channel Pacing Threshold Pulse Width: 0.4 ms
Lead Channel Pacing Threshold Pulse Width: 0.4 ms
Lead Channel Sensing Intrinsic Amplitude: 2.875 mV
Lead Channel Sensing Intrinsic Amplitude: 2.875 mV
Lead Channel Sensing Intrinsic Amplitude: 3 mV
Lead Channel Sensing Intrinsic Amplitude: 3 mV
Lead Channel Setting Pacing Amplitude: 2 V
Lead Channel Setting Pacing Amplitude: 3.5 V
Lead Channel Setting Pacing Pulse Width: 0.4 ms
Lead Channel Setting Sensing Sensitivity: 2 mV

## 2020-07-15 NOTE — Telephone Encounter (Signed)
New message     Patient was due to send in a remote pacemaker check days ago.  Her phone lines have been down but is now back up.  She cannot get her pacemaker check to go thru.  Since the phones lines were down for several days, will she need to "reboot" the pacemaker box or what?  Please call

## 2020-07-15 NOTE — Telephone Encounter (Signed)
Returned patients call. Advised patient that we have received remotes from her. Patient advised if her phone lines go down, once they come back up her monitor will reboot and work. Patient scheduled for remotes and explained how she can check mychart to see after visit summary of the remote. Advised patient if she has any further questions or concerns to please feel free to call. Verbalized understanding.

## 2020-07-19 ENCOUNTER — Encounter: Payer: Self-pay | Admitting: Cardiology

## 2020-07-19 ENCOUNTER — Ambulatory Visit (INDEPENDENT_AMBULATORY_CARE_PROVIDER_SITE_OTHER): Payer: Medicare Other | Admitting: Cardiology

## 2020-07-19 VITALS — BP 120/76 | HR 70 | Ht 69.0 in | Wt 187.0 lb

## 2020-07-19 DIAGNOSIS — I495 Sick sinus syndrome: Secondary | ICD-10-CM | POA: Diagnosis not present

## 2020-07-19 DIAGNOSIS — Z23 Encounter for immunization: Secondary | ICD-10-CM | POA: Diagnosis not present

## 2020-07-19 DIAGNOSIS — I480 Paroxysmal atrial fibrillation: Secondary | ICD-10-CM | POA: Diagnosis not present

## 2020-07-19 MED ORDER — APIXABAN 5 MG PO TABS: ORAL_TABLET | ORAL | 0 refills | Status: DC

## 2020-07-19 NOTE — Patient Instructions (Addendum)

## 2020-07-19 NOTE — Progress Notes (Signed)
Remote pacemaker transmission.   

## 2020-07-19 NOTE — Progress Notes (Signed)
Cardiology Office Note  Date: 07/19/2020   ID: [name], DOB 1951-08-27, MRN [id number]  PCP:  Rory Percy, MD  Cardiologist:  Rozann Lesches, MD Electrophysiologist:  Thompson Grayer, MD   Chief Complaint  Patient presents with  . Cardiac follow-up    History of Present Illness: Tiffany Melton is a 69 y.o. female last seen in April.  She is here for a routine visit.  She tells me that she has retired since I last saw her.  She is doing well, occasional sense of palpitations but has been less bothered by them.  She follows with Dr. [name], Medtronic pacemaker in place.  AT/AF burden 2.4% by recent device interrogation.  She has known PAF with RVR.  Follow-up Lexiscan Myoview in May was low risk as noted below.  We increased Cardizem CD to 240 mg daily at the last visit.  She uses short acting Cardizem 30 mg tablets as needed.  No reported bleeding problems on Eliquis.  I reviewed her interval lab work as noted below.  Past Medical History:  Diagnosis Date  . Abnormal stress test    GXT 9/13, cath revealed normal coronary arteries  . Paroxysmal atrial fibrillation (HCC)   . Raynaud's disease   . Sick sinus syndrome [facility])     Past Surgical History:  Procedure Laterality Date  . BACK SURGERY    . CARDIAC CATHETERIZATION  2013  . CHOLECYSTECTOMY    . PACEMAKER INSERTION  05-04-14   MDT Advisa dual chamber pacemaker implanted by Dr Caryl Comes for SSS, atrial fibrillation post termination pauses  . PERMANENT PACEMAKER INSERTION N/A 05/04/2014   Procedure: PERMANENT PACEMAKER INSERTION;  Surgeon: Deboraha Sprang, MD;  Location: [facility] CATH LAB;  Service: Cardiovascular;  Laterality: N/A;  . TONSILLECTOMY      Current Outpatient Medications  Medication Sig Dispense Refill  . apixaban (ELIQUIS) 5 MG TABS tablet TAKE 1 TABLET TWICE A DAY 28 tablet 0  . diltiazem (CARDIZEM CD) 240 MG 24 hr capsule Take 1 capsule (240 mg total) by mouth daily. 90 capsule 2  . diltiazem (CARDIZEM) 30 MG  tablet Take 1 tablet (30 mg total) by mouth as needed. 30 tablet 1  . pantoprazole (PROTONIX) 20 MG tablet TAKE 1 TABLET DAILY 90 tablet 3  . simvastatin (ZOCOR) 10 MG tablet Take 1 tablet (10 mg total) by mouth daily. 90 tablet 3   No current facility-administered medications for this visit.   Allergies:  Patient has no known allergies.   ROS: No syncope.  Physical Exam: VS:  BP 120/76   Pulse 70   Ht 5\' 9"  (1.753 m)   Wt 187 lb (84.8 kg)   SpO2 98%   BMI 27.62 kg/m , BMI Body mass index is 27.62 kg/m.  Wt Readings from Last 3 Encounters:  07/19/20 187 lb (84.8 kg)  02/04/20 190 lb (86.2 kg)  12/12/19 184 lb (83.5 kg)    General: Patient appears comfortable at rest. HEENT: Conjunctiva and lids normal, wearing a mask. Neck: Supple, no elevated JVP or carotid bruits, no thyromegaly. Lungs: Clear to auscultation, nonlabored breathing at rest. Cardiac: Regular rate and rhythm, no S3 or significant systolic murmur, no pericardial rub. Extremities: No pitting edema, distal pulses 2+.  ECG:  An ECG dated 02/04/2020 was personally reviewed today and demonstrated:  Atrial paced rhythm.  Recent Labwork:  February 2021: Hemoglobin 12.5, platelets 203 BUN 19, creatinine 0.70, potassium 4.11 May 2020: BUN 22, creatinine 0.87, potassium 4.6, AST  17, ALT 16 cholesterol 168, triglycerides 105, HDL 58, LDL 91, hemoglobin 13.3, platelets 200, TSH 3.27  Other Studies Reviewed Today:  Echocardiogram 02/06/2019: 1. The left ventricle has hyperdynamic systolic function, with an ejection fraction of >65%. The cavity size was normal. Left ventricular diastolic parameters were normal. No evidence of left ventricular regional wall motion abnormalities. 2. The right ventricle has normal systolic function. The cavity was normal. There is no increase in right ventricular wall thickness. Right ventricular systolic pressure normal with an estimated pressure of 22.9 mmHg. 3. The pericardial effusion  is posterior to the left ventricle. 4. Trivial pericardial effusion is present. 5. The aortic valve is tricuspid. Mild calcification of the aortic valve. Mild aortic annular calcification noted. 6. The mitral valve is grossly normal. Mild thickening of the mitral valve leaflet. 7. The tricuspid valve is grossly normal. 8. The aortic root is normal in size and structure.  Lexiscan Myoview 02/09/2020:  No diagnostic ST segment changes to indicate ischemia.  Small, mild intensity, apical anterior defect that is fixed and consistent with breast attenuation. No reversible defects to indicate ischemia.  This is a low risk study.  Nuclear stress EF: 69%.  Assessment and Plan:  1.  Paroxysmal atrial fibrillation with CHA2DS2-VASc score of 2.  She is satisfied with current symptom control and continues on Cardizem CD with short acting Cardizem for breakthrough symptoms.  Continue Eliquis for stroke prophylaxis.  I reviewed her recent lab work.  2.  Sick sinus syndrome status post Medtronic pacemaker placement.  Keep follow-up with Dr. [name].  Medication Adjustments/Labs and Tests Ordered: Current medicines are reviewed at length with the patient today.  Concerns regarding medicines are outlined above.   Tests Ordered: No orders of the defined types were placed in this encounter.   Medication Changes: Meds ordered this encounter  Medications  . apixaban (ELIQUIS) 5 MG TABS tablet    Sig: TAKE 1 TABLET TWICE A DAY    Dispense:  28 tablet    Refill:  0    Lot # [id number] Exp 05/2022     Disposition:  Follow up 6 months in the [place] office.  Signed, Satira Sark, MD, [facility] 07/19/2020 2:18 PM    [facility] [address], [place], [place] [address] Phone: [phone]; Fax: [phone]

## 2020-08-16 DIAGNOSIS — Z08 Encounter for follow-up examination after completed treatment for malignant neoplasm: Secondary | ICD-10-CM | POA: Diagnosis not present

## 2020-08-16 DIAGNOSIS — Z85828 Personal history of other malignant neoplasm of skin: Secondary | ICD-10-CM | POA: Diagnosis not present

## 2020-09-13 ENCOUNTER — Telehealth: Payer: Self-pay | Admitting: Cardiology

## 2020-09-13 MED ORDER — APIXABAN 5 MG PO TABS: ORAL_TABLET | ORAL | 0 refills | Status: DC

## 2020-09-13 NOTE — Telephone Encounter (Signed)
Per phone call from pt- she's needing to apply for assistance for her apixaban (ELIQUIS) 5 MG TABS tablet [[id number]]  Because her ins is no longer going to pay for it.   Please call [phone]

## 2020-09-13 NOTE — Telephone Encounter (Signed)
Reports that insurance is no longer covering eliquis. Says she qualifies for patient assistance and will bring all documents needed for BMS-PAF. Advised that eliquis samples are available for pick up.

## 2020-10-11 ENCOUNTER — Ambulatory Visit (INDEPENDENT_AMBULATORY_CARE_PROVIDER_SITE_OTHER): Payer: Medicare Other

## 2020-10-11 DIAGNOSIS — I480 Paroxysmal atrial fibrillation: Secondary | ICD-10-CM | POA: Diagnosis not present

## 2020-10-12 LAB — CUP PACEART REMOTE DEVICE CHECK
Battery Remaining Longevity: 41 mo
Battery Voltage: 2.97 V
Brady Statistic AP VP Percent: 1 %
Brady Statistic AP VS Percent: 83.43 %
Brady Statistic AS VP Percent: 0.5 %
Brady Statistic AS VS Percent: 15.08 %
Brady Statistic RA Percent Paced: 80.28 %
Brady Statistic RV Percent Paced: 1.51 %
Date Time Interrogation Session: 20220103111528
Implantable Lead Implant Date: 20150727
Implantable Lead Implant Date: 20150727
Implantable Lead Location: 753859
Implantable Lead Location: 753860
Implantable Lead Model: 5076
Implantable Lead Model: 5076
Implantable Pulse Generator Implant Date: 20150727
Lead Channel Impedance Value: 399 Ohm
Lead Channel Impedance Value: 513 Ohm
Lead Channel Impedance Value: 532 Ohm
Lead Channel Impedance Value: 589 Ohm
Lead Channel Pacing Threshold Amplitude: 0.625 V
Lead Channel Pacing Threshold Amplitude: 2.25 V
Lead Channel Pacing Threshold Pulse Width: 0.4 ms
Lead Channel Pacing Threshold Pulse Width: 0.4 ms
Lead Channel Sensing Intrinsic Amplitude: 0.25 mV
Lead Channel Sensing Intrinsic Amplitude: 0.25 mV
Lead Channel Sensing Intrinsic Amplitude: 2.5 mV
Lead Channel Sensing Intrinsic Amplitude: 2.5 mV
Lead Channel Setting Pacing Amplitude: 2 V
Lead Channel Setting Pacing Amplitude: 3.5 V
Lead Channel Setting Pacing Pulse Width: 0.4 ms
Lead Channel Setting Sensing Sensitivity: 2 mV

## 2020-10-23 ENCOUNTER — Other Ambulatory Visit: Payer: Self-pay | Admitting: Cardiology

## 2020-10-25 NOTE — Progress Notes (Signed)
Remote pacemaker transmission.   

## 2020-10-28 ENCOUNTER — Other Ambulatory Visit: Payer: Self-pay | Admitting: Cardiology

## 2020-11-09 ENCOUNTER — Telehealth: Payer: Self-pay | Admitting: Internal Medicine

## 2020-11-09 NOTE — Telephone Encounter (Signed)
Patient is asking what she needed to do to get patient assistance for eliquis. Advised to fill out patient section of application and bring back to office with proof of income. Verbalized understanding.

## 2020-11-09 NOTE — Telephone Encounter (Signed)
Patient is returning a call to Georgina Peer, RN

## 2020-11-10 ENCOUNTER — Other Ambulatory Visit: Payer: Self-pay | Admitting: *Deleted

## 2020-11-10 MED ORDER — APIXABAN 5 MG PO TABS: 5.0000 mg | ORAL_TABLET | Freq: Two times a day (BID) | ORAL | 3 refills | Status: DC

## 2020-11-15 ENCOUNTER — Other Ambulatory Visit: Payer: Self-pay | Admitting: Cardiology

## 2020-11-22 ENCOUNTER — Telehealth: Payer: Self-pay | Admitting: Cardiology

## 2020-11-22 NOTE — Telephone Encounter (Signed)
Noted.  If symptoms are better today, would observe for now.  She is probably worried about possibility of stroke with her history, but she is on Eliquis so her stroke risk should be low.  Symptoms could be related to something entirely different.  Would recommend scheduling a follow-up with her PCP.  If symptoms worsen or she starts to develop focal weakness or speech problems, should go to the ER for neuroimaging.

## 2020-11-22 NOTE — Telephone Encounter (Signed)
Pt voiced understanding and appreciative  

## 2020-11-22 NOTE — Telephone Encounter (Signed)
Pt c/o left side of bottom lip has been numb starting Saturday with a mild headache and some nausea - pt symptoms are better today - pt denies dizziness/chest pain/swelling - denies any medication changes - doesn't know what HR/BP has been

## 2020-11-22 NOTE — Telephone Encounter (Signed)
New message    Offered patient appt for Thursday , patient refused.  Patient is having numbness in the left side of her mouth

## 2020-11-23 ENCOUNTER — Telehealth: Payer: Self-pay | Admitting: Cardiology

## 2020-11-23 MED ORDER — APIXABAN 5 MG PO TABS: 5.0000 mg | ORAL_TABLET | Freq: Two times a day (BID) | ORAL | 3 refills | Status: DC

## 2020-11-23 NOTE — Telephone Encounter (Signed)
Reports eliquis is now tier 2 with insurance and is requesting rx be sent to [facility]. Rx sent.

## 2020-11-23 NOTE — Telephone Encounter (Signed)
Patient called requesting to speak with Tiffany Melton in regards to assistance with her medication.

## 2020-12-10 ENCOUNTER — Ambulatory Visit (INDEPENDENT_AMBULATORY_CARE_PROVIDER_SITE_OTHER): Payer: Medicare Other | Admitting: Internal Medicine

## 2020-12-10 ENCOUNTER — Telehealth: Payer: Self-pay | Admitting: Cardiology

## 2020-12-10 ENCOUNTER — Other Ambulatory Visit: Payer: Self-pay | Admitting: *Deleted

## 2020-12-10 ENCOUNTER — Encounter: Payer: Self-pay | Admitting: Internal Medicine

## 2020-12-10 VITALS — BP 130/80 | HR 74 | Ht 69.0 in | Wt 187.2 lb

## 2020-12-10 DIAGNOSIS — I480 Paroxysmal atrial fibrillation: Secondary | ICD-10-CM

## 2020-12-10 DIAGNOSIS — R0602 Shortness of breath: Secondary | ICD-10-CM | POA: Diagnosis not present

## 2020-12-10 DIAGNOSIS — R0600 Dyspnea, unspecified: Secondary | ICD-10-CM | POA: Diagnosis not present

## 2020-12-10 DIAGNOSIS — R0609 Other forms of dyspnea: Secondary | ICD-10-CM

## 2020-12-10 DIAGNOSIS — I495 Sick sinus syndrome: Secondary | ICD-10-CM

## 2020-12-10 DIAGNOSIS — D6869 Other thrombophilia: Secondary | ICD-10-CM | POA: Diagnosis not present

## 2020-12-10 LAB — CUP PACEART INCLINIC DEVICE CHECK
Battery Remaining Longevity: 43 mo
Battery Voltage: 2.96 V
Brady Statistic AP VP Percent: 0.85 %
Brady Statistic AP VS Percent: 80.36 %
Brady Statistic AS VP Percent: 0.22 %
Brady Statistic AS VS Percent: 18.57 %
Brady Statistic RA Percent Paced: 76.79 %
Brady Statistic RV Percent Paced: 1.07 %
Date Time Interrogation Session: 20220304101706
Implantable Lead Implant Date: 20150727
Implantable Lead Implant Date: 20150727
Implantable Lead Location: 753859
Implantable Lead Location: 753860
Implantable Lead Model: 5076
Implantable Lead Model: 5076
Implantable Pulse Generator Implant Date: 20150727
Lead Channel Impedance Value: 437 Ohm
Lead Channel Impedance Value: 551 Ohm
Lead Channel Impedance Value: 570 Ohm
Lead Channel Impedance Value: 608 Ohm
Lead Channel Pacing Threshold Amplitude: 0.5 V
Lead Channel Pacing Threshold Amplitude: 2.75 V
Lead Channel Pacing Threshold Pulse Width: 0.4 ms
Lead Channel Pacing Threshold Pulse Width: 1 ms
Lead Channel Sensing Intrinsic Amplitude: 3 mV
Lead Channel Sensing Intrinsic Amplitude: 3.6 mV
Lead Channel Setting Pacing Amplitude: 2 V
Lead Channel Setting Pacing Amplitude: 3.5 V
Lead Channel Setting Pacing Pulse Width: 1 ms
Lead Channel Setting Sensing Sensitivity: 2 mV

## 2020-12-10 MED ORDER — APIXABAN 5 MG PO TABS: 5.0000 mg | ORAL_TABLET | Freq: Two times a day (BID) | ORAL | 3 refills | Status: DC

## 2020-12-10 NOTE — Patient Instructions (Signed)
Medication Instructions:   Hold your Diltiazem x 7 days, then resume.   Continue all other medications.    Labwork: none  Testing/Procedures:  Your physician has recommended that you have a pulmonary function test. Pulmonary Function Tests are a group of tests that measure how well air moves in and out of your lungs.  Your physician has recommended that you have a cardiopulmonary stress test (CPX). CPX testing is a non-invasive measurement of heart and lung function. It replaces a traditional treadmill stress test. This type of test provides a tremendous amount of information that relates not only to your present condition but also for future outcomes. This test combines measurements of you ventilation, respiratory gas exchange in the lungs, electrocardiogram (EKG), blood pressure and physical response before, during, and following an exercise protocol.  Office will contact with results via phone or letter.    Follow-Up: 1 year   Any Other Special Instructions Will Be Listed Below (If Applicable).  If you need a refill on your cardiac medications before your next appointment, please call your pharmacy.

## 2020-12-10 NOTE — Telephone Encounter (Signed)
Was advised by [facility] that PA needed for eliquis. Patient contacted to get Need Rx card info to initiate PA

## 2020-12-10 NOTE — Progress Notes (Signed)
PCP: [facility] Cardiologist: Dr Domenic Polite Primary EP:  Dr Hilda Blades Lamm is a 70 y.o. female who presents today for routine electrophysiology followup.  Since last being seen in our clinic, the patient reports doing very well.  She continues to have chronic SOB/ fatigue.  She reports exertional symptoms.  She has rare afib with palpitations.  Today, she denies symptoms of lower extremity edema, dizziness, presyncope, or syncope.  The patient is otherwise without complaint today.   Past Medical History:  Diagnosis Date  . Abnormal stress test    GXT 9/13, cath revealed normal coronary arteries  . Paroxysmal atrial fibrillation (HCC)   . Raynaud's disease   . Sick sinus syndrome [facility])    Past Surgical History:  Procedure Laterality Date  . BACK SURGERY    . CARDIAC CATHETERIZATION  2013  . CHOLECYSTECTOMY    . PACEMAKER INSERTION  05-04-14   MDT Advisa dual chamber pacemaker implanted by Dr Caryl Comes for SSS, atrial fibrillation post termination pauses  . PERMANENT PACEMAKER INSERTION N/A 05/04/2014   Procedure: PERMANENT PACEMAKER INSERTION;  Surgeon: Deboraha Sprang, MD;  Location: [facility] CATH LAB;  Service: Cardiovascular;  Laterality: N/A;  . TONSILLECTOMY      ROS- all systems are reviewed and negative except as per HPI above  Current Outpatient Medications  Medication Sig Dispense Refill  . apixaban (ELIQUIS) 5 MG TABS tablet Take 1 tablet (5 mg total) by mouth 2 (two) times daily. 180 tablet 3  . diltiazem (CARDIZEM CD) 240 MG 24 hr capsule TAKE 1 CAPSULE BY MOUTH ONCE DAILY. 90 capsule 1  . diltiazem (CARDIZEM) 30 MG tablet Take 1 tablet (30 mg total) by mouth as needed. 30 tablet 1  . pantoprazole (PROTONIX) 20 MG tablet Take 20 mg by mouth every other day.    . simvastatin (ZOCOR) 10 MG tablet TAKE 1 TABLET DAILY 90 tablet 3   No current facility-administered medications for this visit.    Physical Exam: Vitals:   12/10/20 0925  BP: 130/80   Pulse: 74  SpO2: 99%  Weight: 187 lb 3.2 oz (84.9 kg)  Height: 5\' 9"  (1.753 m)    GEN- The patient is well appearing, alert and oriented x 3 today.   Head- normocephalic, atraumatic Eyes-  Sclera clear, conjunctiva pink Ears- hearing intact Oropharynx- clear Lungs- Clear to ausculation bilaterally, normal work of breathing Chest- pacemaker pocket is well healed Heart- Regular rate and rhythm, no murmurs, rubs or gallops, PMI not laterally displaced GI- soft, NT, ND, + BS Extremities- no clubbing, cyanosis, or edema  Pacemaker interrogation- reviewed in detail today,  See PACEART report   Assessment and Plan:  1. Symptomatic sinus bradycardia  Normal pacemaker function See Pace Art report RV threshold is chronically elevated,   Does not RV pace she is not device dependant today  2. Paroxysmal atrial fibrillation afib burden is 1.9 % (Previously 1.8%) chads2vasc score is 2.  She is on eliquis I worry that her SOB could possibly be due to diltiazem. She will hold dilt for 7 days.  If her symptoms resolve, then we should proceed with ablation with hopes of getting her off of diltiazem long term. If however her symptoms do not improve, then she should resume diltiazem and continue current medical therapy  Risk, benefits, and alternatives to EP study and radiofrequency ablation for afib were also discussed in detail today. These risks include but are not limited to stroke, bleeding, vascular damage,  tamponade, perforation, damage to the esophagus, lungs, and other structures, pulmonary vein stenosis, worsening renal function, and death. The patient understands these risk and wishes to think about this further.   3. SOB Echo and myoview are reviewed Will order PFTs and CPX to further assess Try time off of diltiazem also (as above)  Risks, benefits and potential toxicities for medications prescribed and/or refilled reviewed with patient today.   Follow-up with Dr Domenic Polite as  scheduled Return to see me in a year unless she decides to pursue ablation (as above)  Thompson Grayer MD, [facility] 12/10/2020 9:37 AM

## 2020-12-10 NOTE — Telephone Encounter (Signed)
New message     Patient returned call to [facility]

## 2020-12-13 NOTE — Telephone Encounter (Signed)
Spoke with patient and she says she received her eliquis rx today Advised that a request was made through cover my meds to get a lower copay and it was denied Verbalized understanding.

## 2020-12-27 ENCOUNTER — Encounter (HOSPITAL_COMMUNITY): Payer: Medicare Other

## 2021-01-07 ENCOUNTER — Other Ambulatory Visit (HOSPITAL_COMMUNITY)
Admission: RE | Admit: 2021-01-07 | Discharge: 2021-01-07 | Disposition: A | Discharge status: Home / Self Care | Payer: Medicare Other | Source: Ambulatory Visit | Attending: Internal Medicine | Admitting: Internal Medicine

## 2021-01-07 ENCOUNTER — Other Ambulatory Visit: Payer: Self-pay

## 2021-01-07 DIAGNOSIS — Z01812 Encounter for preprocedural laboratory examination: Secondary | ICD-10-CM | POA: Diagnosis not present

## 2021-01-07 DIAGNOSIS — Z20822 Contact with and (suspected) exposure to covid-19: Secondary | ICD-10-CM | POA: Diagnosis not present

## 2021-01-08 LAB — SARS CORONAVIRUS 2 (TAT 6-24 HRS): SARS Coronavirus 2: NEGATIVE

## 2021-01-10 ENCOUNTER — Ambulatory Visit (INDEPENDENT_AMBULATORY_CARE_PROVIDER_SITE_OTHER): Payer: Medicare Other

## 2021-01-10 DIAGNOSIS — I480 Paroxysmal atrial fibrillation: Secondary | ICD-10-CM | POA: Diagnosis not present

## 2021-01-11 ENCOUNTER — Other Ambulatory Visit: Payer: Self-pay

## 2021-01-11 ENCOUNTER — Ambulatory Visit (HOSPITAL_COMMUNITY)
Admission: RE | Admit: 2021-01-11 | Discharge: 2021-01-11 | Disposition: A | Discharge status: Home / Self Care | Payer: Medicare Other | Source: Ambulatory Visit | Attending: Internal Medicine | Admitting: Internal Medicine

## 2021-01-11 DIAGNOSIS — R0602 Shortness of breath: Secondary | ICD-10-CM | POA: Diagnosis not present

## 2021-01-11 LAB — PULMONARY FUNCTION TEST
DL/VA % pred: 84 %
DL/VA: 3.37 ml/min/mmHg/L
DLCO unc % pred: 76 %
DLCO unc: 17.61 ml/min/mmHg
FEF 25-75 Post: 2.45 L/sec
FEF 25-75 Pre: 2.12 L/sec
FEF2575-%Change-Post: 15 %
FEF2575-%Pred-Post: 111 %
FEF2575-%Pred-Pre: 96 %
FEV1-%Change-Post: 4 %
FEV1-%Pred-Post: 94 %
FEV1-%Pred-Pre: 90 %
FEV1-Post: 2.62 L
FEV1-Pre: 2.5 L
FEV1FVC-%Change-Post: 4 %
FEV1FVC-%Pred-Pre: 99 %
FEV6-%Change-Post: 1 %
FEV6-%Pred-Post: 94 %
FEV6-%Pred-Pre: 93 %
FEV6-Post: 3.3 L
FEV6-Pre: 3.26 L
FEV6FVC-%Change-Post: 0 %
FEV6FVC-%Pred-Post: 104 %
FEV6FVC-%Pred-Pre: 103 %
FVC-%Change-Post: 0 %
FVC-%Pred-Post: 90 %
FVC-%Pred-Pre: 90 %
FVC-Post: 3.3 L
FVC-Pre: 3.3 L
Post FEV1/FVC ratio: 79 %
Post FEV6/FVC ratio: 100 %
Pre FEV1/FVC ratio: 76 %
Pre FEV6/FVC Ratio: 99 %
RV % pred: 88 %
RV: 2.17 L
TLC % pred: 98 %
TLC: 5.7 L

## 2021-01-11 MED ORDER — ALBUTEROL SULFATE (2.5 MG/3ML) 0.083% IN NEBU
2.5000 mg | INHALATION_SOLUTION | Freq: Once | RESPIRATORY_TRACT | Status: AC
  Administered 2021-01-11: 2.5 mg via RESPIRATORY_TRACT

## 2021-01-12 LAB — CUP PACEART REMOTE DEVICE CHECK
Battery Remaining Longevity: 37 mo
Battery Voltage: 2.96 V
Brady Statistic AP VP Percent: 2.46 %
Brady Statistic AP VS Percent: 82.38 %
Brady Statistic AS VP Percent: 0.49 %
Brady Statistic AS VS Percent: 14.67 %
Brady Statistic RA Percent Paced: 80.21 %
Brady Statistic RV Percent Paced: 2.92 %
Date Time Interrogation Session: 20220405103845
Implantable Lead Implant Date: 20150727
Implantable Lead Implant Date: 20150727
Implantable Lead Location: 753859
Implantable Lead Location: 753860
Implantable Lead Model: 5076
Implantable Lead Model: 5076
Implantable Pulse Generator Implant Date: 20150727
Lead Channel Impedance Value: 437 Ohm
Lead Channel Impedance Value: 532 Ohm
Lead Channel Impedance Value: 570 Ohm
Lead Channel Impedance Value: 608 Ohm
Lead Channel Pacing Threshold Amplitude: 0.625 V
Lead Channel Pacing Threshold Amplitude: 2.5 V
Lead Channel Pacing Threshold Pulse Width: 0.4 ms
Lead Channel Pacing Threshold Pulse Width: 0.4 ms
Lead Channel Sensing Intrinsic Amplitude: 3.5 mV
Lead Channel Sensing Intrinsic Amplitude: 3.5 mV
Lead Channel Sensing Intrinsic Amplitude: 3.625 mV
Lead Channel Sensing Intrinsic Amplitude: 3.625 mV
Lead Channel Setting Pacing Amplitude: 2 V
Lead Channel Setting Pacing Amplitude: 3.5 V
Lead Channel Setting Pacing Pulse Width: 1 ms
Lead Channel Setting Sensing Sensitivity: 2 mV

## 2021-01-14 DIAGNOSIS — J309 Allergic rhinitis, unspecified: Secondary | ICD-10-CM | POA: Diagnosis not present

## 2021-01-14 DIAGNOSIS — J029 Acute pharyngitis, unspecified: Secondary | ICD-10-CM | POA: Diagnosis not present

## 2021-01-14 DIAGNOSIS — J069 Acute upper respiratory infection, unspecified: Secondary | ICD-10-CM | POA: Diagnosis not present

## 2021-01-17 ENCOUNTER — Other Ambulatory Visit: Payer: Self-pay

## 2021-01-17 ENCOUNTER — Ambulatory Visit (HOSPITAL_COMMUNITY): Payer: Medicare Other | Attending: Internal Medicine

## 2021-01-17 DIAGNOSIS — R0602 Shortness of breath: Secondary | ICD-10-CM | POA: Insufficient documentation

## 2021-01-19 ENCOUNTER — Telehealth: Payer: Self-pay | Admitting: *Deleted

## 2021-01-19 NOTE — Telephone Encounter (Signed)
Laurine Blazer, LPN  1/46/0479 98:72 PM EDT Back to Top     Left message to return call.

## 2021-01-19 NOTE — Telephone Encounter (Signed)
-----   Message from Darrell Jewel, RN sent at 01/18/2021 12:36 PM EDT -----  ----- Message ----- From: Thompson Grayer, MD Sent: 01/16/2021  12:23 PM EDT To: Darrell Jewel, RN  Results reviewed.  Otila Kluver, please inform pt of result.

## 2021-01-20 NOTE — Progress Notes (Signed)
Remote pacemaker transmission.   

## 2021-01-20 NOTE — Telephone Encounter (Signed)
Patient informed of stable results.  Already has follow up scheduled with Dr. Domenic Polite for 01/24/2021.

## 2021-01-24 ENCOUNTER — Encounter: Payer: Self-pay | Admitting: Cardiology

## 2021-01-24 ENCOUNTER — Ambulatory Visit (INDEPENDENT_AMBULATORY_CARE_PROVIDER_SITE_OTHER): Payer: Medicare Other | Admitting: Cardiology

## 2021-01-24 ENCOUNTER — Other Ambulatory Visit: Payer: Self-pay

## 2021-01-24 VITALS — BP 144/70 | HR 63 | Ht 69.0 in | Wt 191.0 lb

## 2021-01-24 DIAGNOSIS — R0609 Other forms of dyspnea: Secondary | ICD-10-CM

## 2021-01-24 DIAGNOSIS — I495 Sick sinus syndrome: Secondary | ICD-10-CM | POA: Diagnosis not present

## 2021-01-24 DIAGNOSIS — I480 Paroxysmal atrial fibrillation: Secondary | ICD-10-CM | POA: Diagnosis not present

## 2021-01-24 DIAGNOSIS — R0600 Dyspnea, unspecified: Secondary | ICD-10-CM | POA: Diagnosis not present

## 2021-01-24 NOTE — Patient Instructions (Signed)
Medication Instructions:  Continue all current medications.   Labwork: none  Testing/Procedures: none  Follow-Up: 6 months   Any Other Special Instructions Will Be Listed Below (If Applicable).   If you need a refill on your cardiac medications before your next appointment, please call your pharmacy.  

## 2021-01-24 NOTE — Progress Notes (Signed)
Cardiology Office Note  Date: 01/24/2021   ID: [name], DOB [birth year], MRN [id number]  PCP:  [facility]  Cardiologist:  Rozann Lesches, MD Electrophysiologist:  Thompson Grayer, MD   Chief Complaint  Patient presents with  . Cardiac follow-up    History of Present Illness: Tiffany Melton is a 70 y.o. female last seen in October 2021.  She presents for a follow-up visit.  States that she had an episode of atrial fibrillation over the weekend, spontaneously resolved.  I personally reviewed her ECG today which shows an atrial paced rhythm.  She sees Dr. [name], [employer]tr[employer] pacemaker in place.  I reviewed his most recent note from March.  She underwent PFTs and also a CPX test that were both very reassuring in terms of shortness of breath.  We discussed this today.  I talked with her about a regular exercise plan, also diet.  I reviewed her medications which are outlined below.  She does not report any bleeding problems on Eliquis.  Past Medical History:  Diagnosis Date  . Abnormal stress test    GXT 9/13, cath revealed normal coronary arteries  . Paroxysmal atrial fibrillation (HCC)   . Raynaud's disease   . Sick sinus syndrome [facility])     Past Surgical History:  Procedure Laterality Date  . BACK SURGERY    . CARDIAC CATHETERIZATION  2013  . CHOLECYSTECTOMY    . PACEMAKER INSERTION  05-04-14   MDT Advisa dual chamber pacemaker implanted by Dr Caryl Comes for SSS, atrial fibrillation post termination pauses  . PERMANENT PACEMAKER INSERTION N/A 05/04/2014   Procedure: PERMANENT PACEMAKER INSERTION;  Surgeon: Deboraha Sprang, MD;  Location: [facility] CATH LAB;  Service: Cardiovascular;  Laterality: N/A;  . TONSILLECTOMY      Current Outpatient Medications  Medication Sig Dispense Refill  . apixaban (ELIQUIS) 5 MG TABS tablet Take 1 tablet (5 mg total) by mouth 2 (two) times daily. 180 tablet 3  . calcium-vitamin D 250-100 MG-UNIT tablet Take 1 tablet by mouth 2 (two) times  daily.    Marland Kitchen diltiazem (CARDIZEM CD) 240 MG 24 hr capsule TAKE 1 CAPSULE BY MOUTH ONCE DAILY. 90 capsule 1  . diltiazem (CARDIZEM) 30 MG tablet Take 1 tablet (30 mg total) by mouth as needed. 30 tablet 1  . pantoprazole (PROTONIX) 20 MG tablet Take 20 mg by mouth every other day.    . simvastatin (ZOCOR) 10 MG tablet TAKE 1 TABLET DAILY 90 tablet 3   No current facility-administered medications for this visit.   Allergies:  Patient has no known allergies.   ROS: No syncope.  Physical Exam: VS:  BP (!) 144/70   Pulse 63   Ht 5\' 9"  (1.753 m)   Wt 191 lb (86.6 kg)   SpO2 97%   BMI 28.21 kg/m , BMI Body mass index is 28.21 kg/m.  Wt Readings from Last 3 Encounters:  01/24/21 191 lb (86.6 kg)  12/10/20 187 lb 3.2 oz (84.9 kg)  07/19/20 187 lb (84.8 kg)    General: Patient appears comfortable at rest. HEENT: Conjunctiva and lids normal, wearing a mask. Neck: Supple, no elevated JVP or carotid bruits, no thyromegaly. Lungs: Clear to auscultation, nonlabored breathing at rest. Cardiac: Regular rate and rhythm, no S3 or significant systolic murmur, no pericardial rub. Extremities: No pitting edema.  ECG:  An ECG dated 02/04/2020 was personally reviewed today and demonstrated:  Atrial paced rhythm.  Recent Labwork:  August 2021: BUN 22, creatinine 0.87,  potassium 4.6, AST 17, ALT 16 cholesterol 168, triglycerides 105, HDL 58, LDL 91, hemoglobin 13.3, platelets 200, TSH 3.27  Other Studies Reviewed Today:  Echocardiogram 02/06/2019: 1. The left ventricle has hyperdynamic systolic function, with an ejection fraction of >65%. The cavity size was normal. Left ventricular diastolic parameters were normal. No evidence of left ventricular regional wall motion abnormalities. 2. The right ventricle has normal systolic function. The cavity was normal. There is no increase in right ventricular wall thickness. Right ventricular systolic pressure normal with an estimated pressure of 22.9  mmHg. 3. The pericardial effusion is posterior to the left ventricle. 4. Trivial pericardial effusion is present. 5. The aortic valve is tricuspid. Mild calcification of the aortic valve. Mild aortic annular calcification noted. 6. The mitral valve is grossly normal. Mild thickening of the mitral valve leaflet. 7. The tricuspid valve is grossly normal. 8. The aortic root is normal in size and structure.  Lexiscan Myoview 02/09/2020:  No diagnostic ST segment changes to indicate ischemia.  Small, mild intensity, apical anterior defect that is fixed and consistent with breast attenuation. No reversible defects to indicate ischemia.  This is a low risk study.  Nuclear stress EF: 69%.  Assessment and Plan:  1.  Paroxysmal atrial fibrillation with CHA2DS2-VASc score of 2.  ECG shows atrial pacing today.  She still has relatively low rhythm burden based on device interrogation.  Continue Eliquis for stroke prophylaxis, also Cardizem CD.  2.  Dyspnea on exertion.  Recent PFTs and CPX were very reassuring.  I have talked with her today about a regular exercise plan and diet.  3.  Sick sinus syndrome status post Medtronic pacemaker.  She continues to follow with Dr. [name].  Medication Adjustments/Labs and Tests Ordered: Current medicines are reviewed at length with the patient today.  Concerns regarding medicines are outlined above.   Tests Ordered: Orders Placed This Encounter  Procedures  . EKG 12-Lead    Medication Changes: No orders of the defined types were placed in this encounter.   Disposition:  Follow up 6 months in the [place] office.  Signed, Satira Sark, MD, [facility] 01/24/2021 2:15 PM    [place] [facility] [place], [place], [place] [address] Phone: [phone]; Fax: [phone]

## 2021-04-12 ENCOUNTER — Ambulatory Visit (INDEPENDENT_AMBULATORY_CARE_PROVIDER_SITE_OTHER): Payer: Medicare Other

## 2021-04-12 DIAGNOSIS — I495 Sick sinus syndrome: Secondary | ICD-10-CM | POA: Diagnosis not present

## 2021-04-12 LAB — CUP PACEART REMOTE DEVICE CHECK
Battery Remaining Longevity: 36 mo
Battery Voltage: 2.96 V
Brady Statistic AP VP Percent: 2.21 %
Brady Statistic AP VS Percent: 82.24 %
Brady Statistic AS VP Percent: 0.46 %
Brady Statistic AS VS Percent: 15.09 %
Brady Statistic RA Percent Paced: 81 %
Brady Statistic RV Percent Paced: 2.72 %
Date Time Interrogation Session: 20220705132454
Implantable Lead Implant Date: 20150727
Implantable Lead Implant Date: 20150727
Implantable Lead Location: 753859
Implantable Lead Location: 753860
Implantable Lead Model: 5076
Implantable Lead Model: 5076
Implantable Pulse Generator Implant Date: 20150727
Lead Channel Impedance Value: 475 Ohm
Lead Channel Impedance Value: 513 Ohm
Lead Channel Impedance Value: 570 Ohm
Lead Channel Impedance Value: 608 Ohm
Lead Channel Pacing Threshold Amplitude: 0.75 V
Lead Channel Pacing Threshold Amplitude: 2.5 V
Lead Channel Pacing Threshold Pulse Width: 0.4 ms
Lead Channel Pacing Threshold Pulse Width: 0.4 ms
Lead Channel Sensing Intrinsic Amplitude: 0.25 mV
Lead Channel Sensing Intrinsic Amplitude: 0.25 mV
Lead Channel Sensing Intrinsic Amplitude: 2.5 mV
Lead Channel Sensing Intrinsic Amplitude: 2.5 mV
Lead Channel Setting Pacing Amplitude: 2 V
Lead Channel Setting Pacing Amplitude: 3.5 V
Lead Channel Setting Pacing Pulse Width: 1 ms
Lead Channel Setting Sensing Sensitivity: 2 mV

## 2021-04-15 DIAGNOSIS — Z20822 Contact with and (suspected) exposure to covid-19: Secondary | ICD-10-CM | POA: Diagnosis not present

## 2021-04-19 DIAGNOSIS — R32 Unspecified urinary incontinence: Secondary | ICD-10-CM | POA: Diagnosis not present

## 2021-04-19 DIAGNOSIS — Z6830 Body mass index (BMI) 30.0-30.9, adult: Secondary | ICD-10-CM | POA: Diagnosis not present

## 2021-05-02 NOTE — Progress Notes (Signed)
Remote pacemaker transmission.   

## 2021-06-01 ENCOUNTER — Other Ambulatory Visit: Payer: Self-pay | Admitting: Cardiology

## 2021-06-14 ENCOUNTER — Ambulatory Visit: Payer: Medicare Other | Admitting: Urology

## 2021-06-17 ENCOUNTER — Ambulatory Visit (INDEPENDENT_AMBULATORY_CARE_PROVIDER_SITE_OTHER): Payer: Medicare Other | Admitting: Urology

## 2021-06-17 ENCOUNTER — Encounter: Payer: Self-pay | Admitting: Urology

## 2021-06-17 ENCOUNTER — Other Ambulatory Visit: Payer: Self-pay

## 2021-06-17 VITALS — BP 118/61 | HR 64

## 2021-06-17 DIAGNOSIS — N3000 Acute cystitis without hematuria: Secondary | ICD-10-CM | POA: Diagnosis not present

## 2021-06-17 DIAGNOSIS — R350 Frequency of micturition: Secondary | ICD-10-CM

## 2021-06-17 LAB — URINALYSIS, ROUTINE W REFLEX MICROSCOPIC
Bilirubin, UA: NEGATIVE
Glucose, UA: NEGATIVE
Ketones, UA: NEGATIVE
Leukocytes,UA: NEGATIVE
Nitrite, UA: NEGATIVE
Protein,UA: NEGATIVE
Specific Gravity, UA: 1.02 (ref 1.005–1.030)
Urobilinogen, Ur: 0.2 mg/dL (ref 0.2–1.0)
pH, UA: 6 (ref 5.0–7.5)

## 2021-06-17 LAB — MICROSCOPIC EXAMINATION
Renal Epithel, UA: NONE SEEN /hpf
WBC, UA: NONE SEEN /hpf (ref 0–5)

## 2021-06-17 MED ORDER — FLUCONAZOLE 150 MG PO TABS: 150.0000 mg | ORAL_TABLET | Freq: Every day | ORAL | 0 refills | Status: DC

## 2021-06-17 NOTE — Patient Instructions (Signed)

## 2021-06-17 NOTE — Progress Notes (Signed)
06/17/2021 10:39 AM   Tiffany Melton [birth year] [id number]  Referring provider: [facility] [place],  [place] [address]  Urinary incontinence   HPI: Ms Takeshita is a 70yo here for evaluation of urinary incontinence and urinary frequency. Starting 1 year ago she noted gradually worsening urinary frequency and urge incontinence. She has nocturia 2x. She uses 5-6 pads per day which are soaked and she soaks 2 pads at night. She has a strong urge prior to her incontinence episode. NO hematuria or dysuria.  UA shows 3-10 RBCs. She has a 30pk year smoking hx. 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 She has issues with constipation and has a BM every 2-3 days.    PMH: Past Medical History:  Diagnosis Date   Abnormal stress test    GXT 9/13, cath revealed normal coronary arteries   Paroxysmal atrial fibrillation (HCC)    Raynaud's disease    Sick sinus syndrome [facility])     Surgical History: Past Surgical History:  Procedure Laterality Date   BACK SURGERY     CARDIAC CATHETERIZATION  2013   CHOLECYSTECTOMY     PACEMAKER INSERTION  05-04-14   MDT Advisa dual chamber pacemaker implanted by Dr Caryl Comes for SSS, atrial fibrillation post termination pauses   PERMANENT PACEMAKER INSERTION N/A 05/04/2014   Procedure: PERMANENT PACEMAKER INSERTION;  Surgeon: Deboraha Sprang, MD;  Location: [facility] CATH LAB;  Service: Cardiovascular;  Laterality: N/A;   TONSILLECTOMY      Home Medications:  Allergies as of 06/17/2021   No Known Allergies      Medication List        Accurate as of June 17, 2021 10:39 AM. If you have any questions, ask your nurse or doctor.          apixaban 5 MG Tabs tablet Commonly known as: Eliquis Take 1 tablet (5 mg total) by mouth 2 (two) times daily.   calcium-vitamin D 250-100 MG-UNIT tablet Take 1 tablet by mouth 2 (two) times daily.   diltiazem 240 MG 24 hr capsule Commonly known as:  CARDIZEM CD TAKE 1 CAPSULE BY MOUTH ONCE DAILY.   diltiazem 30 MG tablet Commonly known as: CARDIZEM Take 1 tablet (30 mg total) by mouth as needed.   pantoprazole 20 MG tablet Commonly known as: PROTONIX Take 20 mg by mouth every other day.   simvastatin 10 MG tablet Commonly known as: ZOCOR TAKE 1 TABLET DAILY        Allergies: No Known Allergies  Family History: Family History  Problem Relation Age of Onset   Hypertension Other     Social History:  reports that she quit smoking about 38 years ago. Her smoking use included cigarettes. She started smoking about 58 years ago. She has a 35.00 pack-year smoking history. She has never used smokeless tobacco. She reports that she does not drink alcohol and does not use drugs.  ROS: All other review of systems were reviewed and are negative except what is noted above in HPI  Physical Exam: BP 118/61   Pulse 64   Constitutional:  Alert and oriented, No acute distress. HEENT: [place] AT, moist mucus membranes.  Trachea midline, no masses. Cardiovascular: No clubbing, cyanosis, or edema. Respiratory: Normal respiratory effort, no increased work of breathing. GI: Abdomen is soft, nontender, nondistended, no abdominal masses GU: No CVA tenderness.  Lymph: No cervical or inguinal lymphadenopathy. Skin: No rashes, bruises or suspicious lesions. Neurologic:  Grossly intact, no focal deficits, moving all 4 extremities. Psychiatric: Normal mood and affect.  Laboratory Data: Lab Results  Component Value Date   WBC 6.8 05/02/2014   HGB 12.8 05/02/2014   HCT 38.8 05/02/2014   MCV 96.3 05/02/2014   PLT 207 05/02/2014    Lab Results  Component Value Date   CREATININE 0.48 (L) 05/02/2014    No results found for: PSA  No results found for: TESTOSTERONE  Lab Results  Component Value Date   HGBA1C 5.7 (H) 05/01/2014    Urinalysis    Component Value Date/Time   COLORURINE YELLOW 05/03/2014 1632   APPEARANCEUR CLEAR  05/03/2014 1632   LABSPEC 1.019 05/03/2014 1632   PHURINE 5.5 05/03/2014 1632   GLUCOSEU NEGATIVE 05/03/2014 1632   HGBUR NEGATIVE 05/03/2014 1632   BILIRUBINUR NEGATIVE 05/03/2014 1632   KETONESUR NEGATIVE 05/03/2014 1632   PROTEINUR NEGATIVE 05/03/2014 1632   UROBILINOGEN 0.2 05/03/2014 1632   NITRITE NEGATIVE 05/03/2014 1632   LEUKOCYTESUR NEGATIVE 05/03/2014 1632    No results found for: LABMICR, [place], RBCUA, LABEPIT, MUCUS, BACTERIA  Pertinent Imaging:  No results found for this or any previous visit.  No results found for this or any previous visit.  No results found for this or any previous visit.  No results found for this or any previous visit.  No results found for this or any previous visit.  No results found for this or any previous visit.  No results found for this or any previous visit.  No results found for this or any previous visit.   Assessment & Plan:    1. Urinary frequency -We will trial mirabegron '25mg'$  daily - BLADDER SCAN AMB NON-IMAGING - Urinalysis, Routine w reflex microscopic  2. Acute cystitis without hematuria -Urine for culture, will call with results - Urine Culture   No follow-ups on file.  Nicolette Bang, MD  [facility] [place]

## 2021-06-17 NOTE — Progress Notes (Signed)
Urological Symptom Review  PVR 8  Patient is experiencing the following symptoms: Hard to postpone urination Burning/pain with urination Get up at night to urinate Leakage of urine   Review of Systems  Gastrointestinal (upper)  : Negative for upper GI symptoms  Gastrointestinal (lower) : Constipation  Constitutional : Negative for symptoms  Skin: Itching  Eyes: Negative for eye symptoms  Ear/Nose/Throat : Negative for Ear/Nose/Throat symptoms  Hematologic/Lymphatic: Negative for Hematologic/Lymphatic symptoms  Cardiovascular : Negative for cardiovascular symptoms  Respiratory : Cough  Endocrine: Negative for endocrine symptoms  Musculoskeletal: Negative for musculoskeletal symptoms  Neurological: Negative for neurological symptoms  Psychologic: Negative for psychiatric symptoms

## 2021-06-19 LAB — URINE CULTURE

## 2021-07-12 ENCOUNTER — Ambulatory Visit (INDEPENDENT_AMBULATORY_CARE_PROVIDER_SITE_OTHER): Payer: Medicare Other

## 2021-07-12 DIAGNOSIS — I495 Sick sinus syndrome: Secondary | ICD-10-CM

## 2021-07-12 LAB — CUP PACEART REMOTE DEVICE CHECK
Battery Remaining Longevity: 35 mo
Battery Voltage: 2.95 V
Brady Statistic AP VP Percent: 2.28 %
Brady Statistic AP VS Percent: 78.61 %
Brady Statistic AS VP Percent: 0.79 %
Brady Statistic AS VS Percent: 18.32 %
Brady Statistic RA Percent Paced: 77 %
Brady Statistic RV Percent Paced: 3.05 %
Date Time Interrogation Session: 20221004054531
Implantable Lead Implant Date: 20150727
Implantable Lead Implant Date: 20150727
Implantable Lead Location: 753859
Implantable Lead Location: 753860
Implantable Lead Model: 5076
Implantable Lead Model: 5076
Implantable Pulse Generator Implant Date: 20150727
Lead Channel Impedance Value: 494 Ohm
Lead Channel Impedance Value: 513 Ohm
Lead Channel Impedance Value: 570 Ohm
Lead Channel Impedance Value: 608 Ohm
Lead Channel Pacing Threshold Amplitude: 0.625 V
Lead Channel Pacing Threshold Amplitude: 2.5 V
Lead Channel Pacing Threshold Pulse Width: 0.4 ms
Lead Channel Pacing Threshold Pulse Width: 0.4 ms
Lead Channel Sensing Intrinsic Amplitude: 2.5 mV
Lead Channel Sensing Intrinsic Amplitude: 2.5 mV
Lead Channel Sensing Intrinsic Amplitude: 3.25 mV
Lead Channel Sensing Intrinsic Amplitude: 3.25 mV
Lead Channel Setting Pacing Amplitude: 2 V
Lead Channel Setting Pacing Amplitude: 3.5 V
Lead Channel Setting Pacing Pulse Width: 1 ms
Lead Channel Setting Sensing Sensitivity: 2 mV

## 2021-07-18 ENCOUNTER — Ambulatory Visit: Payer: Medicare Other | Admitting: Urology

## 2021-07-20 NOTE — Progress Notes (Signed)
Remote pacemaker transmission.   

## 2021-07-22 ENCOUNTER — Ambulatory Visit (INDEPENDENT_AMBULATORY_CARE_PROVIDER_SITE_OTHER): Payer: Medicare Other | Admitting: Urology

## 2021-07-22 ENCOUNTER — Other Ambulatory Visit: Payer: Self-pay

## 2021-07-22 VITALS — BP 109/68 | HR 88

## 2021-07-22 DIAGNOSIS — R350 Frequency of micturition: Secondary | ICD-10-CM

## 2021-07-22 DIAGNOSIS — N3281 Overactive bladder: Secondary | ICD-10-CM | POA: Diagnosis not present

## 2021-07-22 LAB — BLADDER SCAN AMB NON-IMAGING: Scan Result: 0

## 2021-07-22 MED ORDER — GEMTESA 75 MG PO TABS: 1.0000 | ORAL_TABLET | Freq: Every day | ORAL | 0 refills | Status: DC

## 2021-07-22 NOTE — Progress Notes (Signed)
07/22/2021 1:15 PM   Tiffany Melton [birth year] [id number]  Referring provider: Practice, [place],  [place] [address]  Followup OAB   HPI: Tiffany Melton is a 70yo here for followup for OAB. She started mirabegron 25mg  daily last visit which failed to improve her incontinence and she continues to wear 5-6 pads per day. Her nocturia improved to 1x. She continues to have vaginal itching.   PMH: Past Medical History:  Diagnosis Date   Abnormal stress test    GXT 9/13, cath revealed normal coronary arteries   Paroxysmal atrial fibrillation (HCC)    Raynaud's disease    Sick sinus syndrome [facility])     Surgical History: Past Surgical History:  Procedure Laterality Date   BACK SURGERY     CARDIAC CATHETERIZATION  2013   CHOLECYSTECTOMY     PACEMAKER INSERTION  05-04-14   MDT Advisa dual chamber pacemaker implanted by Dr Caryl Comes for SSS, atrial fibrillation post termination pauses   PERMANENT PACEMAKER INSERTION N/A 05/04/2014   Procedure: PERMANENT PACEMAKER INSERTION;  Surgeon: Deboraha Sprang, MD;  [facility] C[facility] LAB;  Service: Cardiovascular;  Laterality: N/A;   TONSILLECTOMY      Home Medications:  Allergies as of 07/22/2021   No Known Allergies      Medication List        Accurate as of July 22, 2021  1:15 PM. If you have any questions, ask your nurse or doctor.          apixaban 5 MG Tabs tablet Commonly known as: Eliquis Take 1 tablet (5 mg total) by mouth 2 (two) times daily.   calcium-vitamin D 250-100 MG-UNIT tablet Take 1 tablet by mouth 2 (two) times daily.   diltiazem 240 MG 24 hr capsule Commonly known as: CARDIZEM CD TAKE 1 CAPSULE BY MOUTH ONCE DAILY.   diltiazem 30 MG tablet Commonly known as: CARDIZEM Take 1 tablet (30 mg total) by mouth as needed.   fluconazole 150 MG tablet Commonly known as: DIFLUCAN Take 1 tablet (150 mg total) by mouth daily.   pantoprazole 20 MG tablet Commonly known as: PROTONIX Take 20 mg by  mouth every other day.   simvastatin 10 MG tablet Commonly known as: ZOCOR TAKE 1 TABLET DAILY        Allergies: No Known Allergies  Family History: Family History  Problem Relation Age of Onset   Hypertension Other     Social History:  reports that she quit smoking about 38 years ago. Her smoking use included cigarettes. She started smoking about 58 years ago. She has a 35.00 pack-year smoking history. She has never used smokeless tobacco. She reports that she does not drink alcohol and does not use drugs.  ROS: All other review of systems were reviewed and are negative except what is noted above in HPI  Physical Exam: BP 109/68   Pulse 88   Constitutional:  Alert and oriented, No acute distress. HEENT: [place] AT, moist mucus membranes.  Trachea midline, no masses. Cardiovascular: No clubbing, cyanosis, or edema. Respiratory: Normal respiratory effort, no increased work of breathing. GI: Abdomen is soft, nontender, nondistended, no abdominal masses GU: No CVA tenderness.  Lymph: No cervical or inguinal lymphadenopathy. Skin: No rashes, bruises or suspicious lesions. Neurologic: Grossly intact, no focal deficits, moving all 4 extremities. Psychiatric: Normal mood and affect.  Laboratory Data: Lab Results  Component Value Date   WBC 6.8 05/02/2014   HGB 12.8 05/02/2014   HCT 38.8  05/02/2014   MCV 96.3 05/02/2014   PLT 207 05/02/2014    Lab Results  Component Value Date   CREATININE 0.48 (L) 05/02/2014    No results found for: PSA  No results found for: TESTOSTERONE  Lab Results  Component Value Date   HGBA1C 5.7 (H) 05/01/2014    Urinalysis    Component Value Date/Time   COLORURINE YELLOW 05/03/2014 1632   APPEARANCEUR Clear 06/17/2021 1010   LABSPEC 1.019 05/03/2014 1632   PHURINE 5.5 05/03/2014 1632   GLUCOSEU Negative 06/17/2021 1010   HGBUR NEGATIVE 05/03/2014 1632   BILIRUBINUR Negative 06/17/2021 1010   KETONESUR NEGATIVE 05/03/2014 1632    PROTEINUR Negative 06/17/2021 1010   PROTEINUR NEGATIVE 05/03/2014 1632   UROBILINOGEN 0.2 05/03/2014 1632   NITRITE Negative 06/17/2021 1010   NITRITE NEGATIVE 05/03/2014 1632   LEUKOCYTESUR Negative 06/17/2021 1010    Lab Results  Component Value Date   LABMICR See below: 06/17/2021   WBCUA None seen 06/17/2021   LABEPIT 0-10 06/17/2021   BACTERIA Few 06/17/2021    Pertinent Imaging:  No results found for this or any previous visit.  No results found for this or any previous visit.  No results found for this or any previous visit.  No results found for this or any previous visit.  No results found for this or any previous visit.  No results found for this or any previous visit.  No results found for this or any previous visit.  No results found for this or any previous visit.   Assessment & Plan:    1. Urinary frequency Gemtesa 75mg  daily - Urinalysis, Routine w reflex microscopic - BLADDER SCAN AMB NON-IMAGING  2. OAB (overactive bladder) -We will trial gemtesa 75mg    No follow-ups on file.  Nicolette Bang, MD  [facility] [place]

## 2021-07-22 NOTE — Progress Notes (Signed)
post void residual=0  Urological Symptom Review  Patient is experiencing the following symptoms: Hard to postpone urination Get up at night to urinate Leakage of urine   Review of Systems  Gastrointestinal (upper)  : Negative for upper GI symptoms  Gastrointestinal (lower) : Negative for lower GI symptoms  Constitutional : Negative for symptoms  Skin: Negative for skin symptoms  Eyes: Negative for eye symptoms  Ear/Nose/Throat : Negative for Ear/Nose/Throat symptoms  Hematologic/Lymphatic: Negative for Hematologic/Lymphatic symptoms  Cardiovascular : Negative for cardiovascular symptoms  Respiratory : Negative for respiratory symptoms  Endocrine: Negative for endocrine symptoms  Musculoskeletal: Negative for musculoskeletal symptoms  Neurological: Negative for neurological symptoms  Psychologic: Negative for psychiatric symptoms

## 2021-07-25 DIAGNOSIS — R350 Frequency of micturition: Secondary | ICD-10-CM | POA: Diagnosis not present

## 2021-07-25 LAB — MICROSCOPIC EXAMINATION
Bacteria, UA: NONE SEEN
Renal Epithel, UA: NONE SEEN /hpf
WBC, UA: NONE SEEN /hpf (ref 0–5)

## 2021-07-25 LAB — URINALYSIS, ROUTINE W REFLEX MICROSCOPIC
Bilirubin, UA: NEGATIVE
Glucose, UA: NEGATIVE
Ketones, UA: NEGATIVE
Leukocytes,UA: NEGATIVE
Nitrite, UA: NEGATIVE
Protein,UA: NEGATIVE
Specific Gravity, UA: 1.015 (ref 1.005–1.030)
Urobilinogen, Ur: 1 mg/dL (ref 0.2–1.0)
pH, UA: 7 (ref 5.0–7.5)

## 2021-07-26 ENCOUNTER — Encounter: Payer: Self-pay | Admitting: Urology

## 2021-07-26 NOTE — Patient Instructions (Signed)

## 2021-08-20 DIAGNOSIS — Z20828 Contact with and (suspected) exposure to other viral communicable diseases: Secondary | ICD-10-CM | POA: Diagnosis not present

## 2021-08-22 DIAGNOSIS — Z23 Encounter for immunization: Secondary | ICD-10-CM | POA: Diagnosis not present

## 2021-08-22 DIAGNOSIS — M25551 Pain in right hip: Secondary | ICD-10-CM | POA: Diagnosis not present

## 2021-08-22 DIAGNOSIS — Z6830 Body mass index (BMI) 30.0-30.9, adult: Secondary | ICD-10-CM | POA: Diagnosis not present

## 2021-08-22 DIAGNOSIS — S61452A Open bite of left hand, initial encounter: Secondary | ICD-10-CM | POA: Diagnosis not present

## 2021-09-15 ENCOUNTER — Other Ambulatory Visit (HOSPITAL_COMMUNITY): Payer: Self-pay | Admitting: Physician Assistant

## 2021-09-15 DIAGNOSIS — M25551 Pain in right hip: Secondary | ICD-10-CM

## 2021-09-19 ENCOUNTER — Ambulatory Visit (INDEPENDENT_AMBULATORY_CARE_PROVIDER_SITE_OTHER): Payer: Medicare Other | Admitting: Urology

## 2021-09-19 ENCOUNTER — Other Ambulatory Visit: Payer: Self-pay

## 2021-09-19 ENCOUNTER — Encounter: Payer: Self-pay | Admitting: Urology

## 2021-09-19 VITALS — BP 127/75 | HR 65

## 2021-09-19 DIAGNOSIS — N3281 Overactive bladder: Secondary | ICD-10-CM

## 2021-09-19 DIAGNOSIS — R350 Frequency of micturition: Secondary | ICD-10-CM | POA: Diagnosis not present

## 2021-09-19 MED ORDER — GEMTESA 75 MG PO TABS: 1.0000 | ORAL_TABLET | Freq: Every day | ORAL | 11 refills | Status: DC

## 2021-09-19 NOTE — Progress Notes (Signed)
09/19/2021 2:34 PM   Tiffany Melton 1951/04/02 [id number]  Referring provider: [facility], [facility] [place],  [place] [address]  Followup nocturia and OAB  HPI: Tiffany Melton is a 70yo here for followup for nocturia and OAB. She was started on Gemtesa 75mg . Nocturia decreased to 0-1x from 3-4x. She has rare urgency and urge incontinence. She is very happy with her response to therapy. No other complaints today.    PMH: Past Medical History:  Diagnosis Date   Abnormal stress test    GXT 9/13, cath revealed normal coronary arteries   Paroxysmal atrial fibrillation (HCC)    Raynaud's disease    Sick sinus syndrome [facility])     Surgical History: Past Surgical History:  Procedure Laterality Date   BACK SURGERY     CARDIAC CATHETERIZATION  2013   CHOLECYSTECTOMY     PACEMAKER INSERTION  05-04-14   MDT Advisa dual chamber pacemaker implanted by Dr Caryl Comes for SSS, atrial fibrillation post termination pauses   PERMANENT PACEMAKER INSERTION N/A 05/04/2014   Procedure: PERMANENT PACEMAKER INSERTION;  Surgeon: Deboraha Sprang, MD;  Location: [facility] CATH LAB;  Service: Cardiovascular;  Laterality: N/A;   TONSILLECTOMY      Home Medications:  Allergies as of 09/19/2021   No Known Allergies      Medication List        Accurate as of September 19, 2021  2:34 PM. If you have any questions, ask your nurse or doctor.          apixaban 5 MG Tabs tablet Commonly known as: Eliquis Take 1 tablet (5 mg total) by mouth 2 (two) times daily.   calcium-vitamin D 250-100 MG-UNIT tablet Take 1 tablet by mouth 2 (two) times daily.   diltiazem 240 MG 24 hr capsule Commonly known as: CARDIZEM CD TAKE 1 CAPSULE BY MOUTH ONCE DAILY.   diltiazem 30 MG tablet Commonly known as: CARDIZEM Take 1 tablet (30 mg total) by mouth as needed.   fluconazole 150 MG tablet Commonly known as: DIFLUCAN Take 1 tablet (150 mg total) by mouth daily.   Gemtesa 75 MG Tabs Generic drug: Vibegron Take 1  capsule by mouth daily.   pantoprazole 20 MG tablet Commonly known as: PROTONIX Take 20 mg by mouth every other day.   simvastatin 10 MG tablet Commonly known as: ZOCOR TAKE 1 TABLET DAILY        Allergies: No Known Allergies  Family History: Family History  Problem Relation Age of Onset   Hypertension Other     Social History:  reports that she quit smoking about 39 years ago. Her smoking use included cigarettes. She started smoking about 58 years ago. She has a 35.00 pack-year smoking history. She has never used smokeless tobacco. She reports that she does not drink alcohol and does not use drugs.  ROS: All other review of systems were reviewed and are negative except what is noted above in HPI  Physical Exam: BP 127/75   Pulse 65   Constitutional:  Alert and oriented, No acute distress. HEENT: [place] AT, moist mucus membranes.  Trachea midline, no masses. Cardiovascular: No clubbing, cyanosis, or edema. Respiratory: Normal respiratory effort, no increased work of breathing. GI: Abdomen is soft, nontender, nondistended, no abdominal masses GU: No CVA tenderness.  Lymph: No cervical or inguinal lymphadenopathy. Skin: No rashes, bruises or suspicious lesions. Neurologic: Grossly intact, no focal deficits, moving all 4 extremities. Psychiatric: Normal mood and affect.  Laboratory Data: Lab Results  Component Value Date   WBC 6.8 05/02/2014   HGB 12.8 05/02/2014   HCT 38.8 05/02/2014   MCV 96.3 05/02/2014   PLT 207 05/02/2014    Lab Results  Component Value Date   CREATININE 0.48 (L) 05/02/2014    No results found for: PSA  No results found for: TESTOSTERONE  Lab Results  Component Value Date   HGBA1C 5.7 (H) 05/01/2014    Urinalysis    Component Value Date/Time   COLORURINE YELLOW 05/03/2014 1632   APPEARANCEUR Clear 07/25/2021 0833   LABSPEC 1.019 05/03/2014 1632   PHURINE 5.5 05/03/2014 1632   GLUCOSEU Negative 07/25/2021 0833   HGBUR NEGATIVE  05/03/2014 1632   BILIRUBINUR Negative 07/25/2021 0833   KETONESUR NEGATIVE 05/03/2014 1632   PROTEINUR Negative 07/25/2021 0833   PROTEINUR NEGATIVE 05/03/2014 1632   UROBILINOGEN 0.2 05/03/2014 1632   NITRITE Negative 07/25/2021 0833   NITRITE NEGATIVE 05/03/2014 1632   LEUKOCYTESUR Negative 07/25/2021 0833    Lab Results  Component Value Date   LABMICR See below: 07/25/2021   WBCUA None seen 07/25/2021   LABEPIT 0-10 07/25/2021   BACTERIA None seen 07/25/2021    Pertinent Imaging:  No results found for this or any previous visit.  No results found for this or any previous visit.  No results found for this or any previous visit.  No results found for this or any previous visit.  No results found for this or any previous visit.  No results found for this or any previous visit.  No results found for this or any previous visit.  No results found for this or any previous visit.   Assessment & Plan:    1. Urinary frequency Continue gemtesa 75mg  daily - Urinalysis, Routine w reflex microscopic  2. OAB (overactive bladder) Continue gemtesa 75mg  daily   No follow-ups on file.  Nicolette Bang, MD  [facility] [place]

## 2021-09-19 NOTE — Patient Instructions (Signed)

## 2021-09-19 NOTE — Progress Notes (Signed)

## 2021-09-20 LAB — URINALYSIS, ROUTINE W REFLEX MICROSCOPIC
Bilirubin, UA: NEGATIVE
Glucose, UA: NEGATIVE
Ketones, UA: NEGATIVE
Nitrite, UA: NEGATIVE
Protein,UA: NEGATIVE
Specific Gravity, UA: 1.015 (ref 1.005–1.030)
Urobilinogen, Ur: 0.2 mg/dL (ref 0.2–1.0)
pH, UA: 5.5 (ref 5.0–7.5)

## 2021-09-20 LAB — MICROSCOPIC EXAMINATION
Epithelial Cells (non renal): >10 /hpf — AB (ref 0–10)
Renal Epithel, UA: NONE SEEN /hpf

## 2021-09-23 LAB — URINE CULTURE

## 2021-10-04 DIAGNOSIS — Z1231 Encounter for screening mammogram for malignant neoplasm of breast: Secondary | ICD-10-CM | POA: Diagnosis not present

## 2021-10-11 ENCOUNTER — Ambulatory Visit (INDEPENDENT_AMBULATORY_CARE_PROVIDER_SITE_OTHER): Payer: Medicare Other

## 2021-10-11 DIAGNOSIS — I495 Sick sinus syndrome: Secondary | ICD-10-CM | POA: Diagnosis not present

## 2021-10-12 LAB — CUP PACEART REMOTE DEVICE CHECK
Battery Remaining Longevity: 33 mo
Battery Voltage: 2.95 V
Brady Statistic AP VP Percent: 3.3 %
Brady Statistic AP VS Percent: 70.74 %
Brady Statistic AS VP Percent: 0.64 %
Brady Statistic AS VS Percent: 25.32 %
Brady Statistic RA Percent Paced: 69.39 %
Brady Statistic RV Percent Paced: 3.96 %
Date Time Interrogation Session: 20230104124332
Implantable Lead Implant Date: 20150727
Implantable Lead Implant Date: 20150727
Implantable Lead Location: 753859
Implantable Lead Location: 753860
Implantable Lead Model: 5076
Implantable Lead Model: 5076
Implantable Pulse Generator Implant Date: 20150727
Lead Channel Impedance Value: 418 Ohm
Lead Channel Impedance Value: 494 Ohm
Lead Channel Impedance Value: 532 Ohm
Lead Channel Impedance Value: 551 Ohm
Lead Channel Pacing Threshold Amplitude: 0.75 V
Lead Channel Pacing Threshold Amplitude: 2.5 V
Lead Channel Pacing Threshold Pulse Width: 0.4 ms
Lead Channel Pacing Threshold Pulse Width: 0.4 ms
Lead Channel Sensing Intrinsic Amplitude: 2.75 mV
Lead Channel Sensing Intrinsic Amplitude: 2.75 mV
Lead Channel Sensing Intrinsic Amplitude: 3.5 mV
Lead Channel Sensing Intrinsic Amplitude: 3.5 mV
Lead Channel Setting Pacing Amplitude: 2 V
Lead Channel Setting Pacing Amplitude: 3.5 V
Lead Channel Setting Pacing Pulse Width: 1 ms
Lead Channel Setting Sensing Sensitivity: 2 mV

## 2021-10-20 NOTE — Progress Notes (Signed)
Remote pacemaker transmission.   

## 2021-10-24 ENCOUNTER — Other Ambulatory Visit: Payer: Self-pay | Admitting: Cardiology

## 2021-10-24 ENCOUNTER — Telehealth: Payer: Self-pay | Admitting: Internal Medicine

## 2021-10-24 NOTE — Telephone Encounter (Signed)
°*  STAT* If patient is at the pharmacy, call can be transferred to refill team.   1. Which medications need to be refilled? (please list name of each medication and dose if known) apixaban (ELIQUIS) 5 MG TABS tablet  2. Which pharmacy/location (including street and city if local pharmacy) is medication to be sent to?[place], [place]  Phone:  [phone] Fax:  [phone]  3. Do they need a 30 day or 90 day supply? 90 ds   [facility].. please [facility] [address] [place], [place] [address] Store [phone]

## 2021-10-24 NOTE — Telephone Encounter (Signed)
Prescription refill request for Eliquis received. Indication: PAF Last office visit: 01/24/21  Myles Gip MD Scr: 0.87 on 05/24/20 Age: 72 Weight: 86.6kg  Based on above findings Eliquis 5mg  twice daily is the appropriate dose.  Pt is PAST DUE for lab work.  Message sent to nurse to arrange CBC/BMP for Eliquis refills. Refill approved x 1 only.

## 2021-10-31 DIAGNOSIS — Z6831 Body mass index (BMI) 31.0-31.9, adult: Secondary | ICD-10-CM | POA: Diagnosis not present

## 2021-10-31 DIAGNOSIS — Z950 Presence of cardiac pacemaker: Secondary | ICD-10-CM | POA: Diagnosis not present

## 2021-10-31 DIAGNOSIS — M542 Cervicalgia: Secondary | ICD-10-CM | POA: Diagnosis not present

## 2021-10-31 DIAGNOSIS — M47812 Spondylosis without myelopathy or radiculopathy, cervical region: Secondary | ICD-10-CM | POA: Diagnosis not present

## 2021-11-03 DIAGNOSIS — H2513 Age-related nuclear cataract, bilateral: Secondary | ICD-10-CM | POA: Diagnosis not present

## 2021-11-03 DIAGNOSIS — H524 Presbyopia: Secondary | ICD-10-CM | POA: Diagnosis not present

## 2021-11-10 DIAGNOSIS — I6523 Occlusion and stenosis of bilateral carotid arteries: Secondary | ICD-10-CM | POA: Diagnosis not present

## 2021-11-10 DIAGNOSIS — R9389 Abnormal findings on diagnostic imaging of other specified body structures: Secondary | ICD-10-CM | POA: Diagnosis not present

## 2021-11-15 ENCOUNTER — Other Ambulatory Visit: Payer: Self-pay

## 2021-11-15 ENCOUNTER — Ambulatory Visit (HOSPITAL_COMMUNITY)
Admission: RE | Admit: 2021-11-15 | Discharge: 2021-11-15 | Disposition: A | Discharge status: Home / Self Care | Payer: Medicare Other | Source: Ambulatory Visit | Attending: Physician Assistant | Admitting: Physician Assistant

## 2021-11-15 DIAGNOSIS — M25551 Pain in right hip: Secondary | ICD-10-CM | POA: Diagnosis not present

## 2021-11-15 NOTE — Progress Notes (Signed)
Informed of MRI for today.   Device system confirmed to be MRI conditional, with implant date > 6 weeks ago, and no evidence of abandoned or epicardial leads in review of most recent CXR Interrogation from today reviewed, pt is currently AP-VS at 60 bpm Change device settings for MRI to DOO at 85 bpm  Tachy-therapies to off if applicable.  Program device back to pre-MRI settings after completion of exam.  With chronic elevated RV threshold WITHOUT dependency, OK to proceed, but will need device checked after MRI.   Shirley Friar, PA-C  11/15/2021 12:53 PM

## 2021-11-15 NOTE — Progress Notes (Signed)
Per order, Changed device settings for MRI to DOO at 85 bpm     Tachy-therapies to off if applicable.     Will program device back to pre-MRI settings after completion of exam.     With chronic elevated RV threshold WITHOUT dependency, OK to proceed, but will need device checked after MRI.

## 2021-11-21 ENCOUNTER — Telehealth: Payer: Self-pay | Admitting: Cardiology

## 2021-11-21 NOTE — Telephone Encounter (Signed)
Prescription refill request for Eliquis received. Indication: afib  Last office visit: [place], 01/24/2021 Scr: 0.70, 12/04/2019 Age: 71 yo Weight: 86.6 kg   Pt is overdue for labs. Attempted to call PCP to see if they had any but was not able to get in touch with them.

## 2021-11-21 NOTE — Telephone Encounter (Signed)
°*  STAT* If patient is at the pharmacy, call can be transferred to refill team.   1. Which medications need to be refilled? (please list name of each medication and dose if known) ELIQUIS 5 MG TABS tablet  2. Which pharmacy/location (including street and city if local pharmacy) is medication to be sent to? [place], [place]  3. Do they need a 30 day or 90 day supply? 30 day

## 2021-11-22 NOTE — Telephone Encounter (Signed)
Called pt.  No answer but left message on voice mail for patient to return call.

## 2021-11-22 NOTE — Telephone Encounter (Signed)
Called pt's PCP and they do not have any labs on patient since 2021. Attempted to call pt no answer.

## 2021-11-23 MED ORDER — APIXABAN 5 MG PO TABS: ORAL_TABLET | ORAL | 0 refills | Status: DC

## 2021-11-23 NOTE — Telephone Encounter (Signed)
Follow Up:    Patient is returning call from yesterday. 

## 2021-11-23 NOTE — Telephone Encounter (Signed)
Returned pt call.  No answer.  Left voice mail.  Pt has upcoming appt with Dr [name] 12/16/21.  Told pt we would order CBC/BMP for Eliquis refills at that visit.  She also needs to make appt with Dr Domenic Polite at that time.  Eliquis refilled x 1 till appt with Dr [name].

## 2021-11-24 DIAGNOSIS — R937 Abnormal findings on diagnostic imaging of other parts of musculoskeletal system: Secondary | ICD-10-CM | POA: Diagnosis not present

## 2021-11-24 DIAGNOSIS — I6523 Occlusion and stenosis of bilateral carotid arteries: Secondary | ICD-10-CM | POA: Diagnosis not present

## 2021-11-24 DIAGNOSIS — M542 Cervicalgia: Secondary | ICD-10-CM | POA: Diagnosis not present

## 2021-11-24 DIAGNOSIS — M47812 Spondylosis without myelopathy or radiculopathy, cervical region: Secondary | ICD-10-CM | POA: Diagnosis not present

## 2021-11-24 DIAGNOSIS — I700 Atherosclerosis of aorta: Secondary | ICD-10-CM | POA: Diagnosis not present

## 2021-12-06 DIAGNOSIS — M5030 Other cervical disc degeneration, unspecified cervical region: Secondary | ICD-10-CM | POA: Diagnosis not present

## 2021-12-06 DIAGNOSIS — M5136 Other intervertebral disc degeneration, lumbar region: Secondary | ICD-10-CM | POA: Diagnosis not present

## 2021-12-07 DIAGNOSIS — Z20822 Contact with and (suspected) exposure to covid-19: Secondary | ICD-10-CM | POA: Diagnosis not present

## 2021-12-09 ENCOUNTER — Encounter: Payer: Medicare Other | Admitting: Internal Medicine

## 2021-12-16 ENCOUNTER — Other Ambulatory Visit: Payer: Self-pay

## 2021-12-16 ENCOUNTER — Encounter: Payer: Self-pay | Admitting: Internal Medicine

## 2021-12-16 ENCOUNTER — Ambulatory Visit (INDEPENDENT_AMBULATORY_CARE_PROVIDER_SITE_OTHER): Payer: Medicare Other | Admitting: Internal Medicine

## 2021-12-16 VITALS — BP 102/62 | HR 81 | Ht 69.0 in | Wt 199.2 lb

## 2021-12-16 DIAGNOSIS — D6869 Other thrombophilia: Secondary | ICD-10-CM

## 2021-12-16 DIAGNOSIS — I480 Paroxysmal atrial fibrillation: Secondary | ICD-10-CM

## 2021-12-16 DIAGNOSIS — I495 Sick sinus syndrome: Secondary | ICD-10-CM | POA: Diagnosis not present

## 2021-12-16 DIAGNOSIS — E7800 Pure hypercholesterolemia, unspecified: Secondary | ICD-10-CM

## 2021-12-16 NOTE — Progress Notes (Signed)
? ? ?PCP: [facility] ?Primary Cardiologist: Dr Domenic Polite ?Primary EP:  Dr [name] ? ?Tiffany Melton is a 71 y.o. female who presents today for routine electrophysiology followup.  Since last being seen in our clinic, the patient reports doing very well.  Her SOB and fatigue and chronic and stable.  She has occasional afib events for which she takes prn diltiazem.  Today, she denies symptoms of palpitations, chest pain, shortness of breath,  lower extremity edema, dizziness, presyncope, or syncope.  The patient is otherwise without complaint today.  ? ?Past Medical History:  ?Diagnosis Date  ? Abnormal stress test   ? GXT 9/13, cath revealed normal coronary arteries  ? Paroxysmal atrial fibrillation (HCC)   ? Raynaud's disease   ? Sick sinus syndrome ([place])   ? ?Past Surgical History:  ?Procedure Laterality Date  ? BACK SURGERY    ? CARDIAC CATHETERIZATION  2013  ? CHOLECYSTECTOMY    ? PACEMAKER INSERTION  05-04-14  ? MDT Advisa dual chamber pacemaker implanted by Dr Caryl Comes for SSS, atrial fibrillation post termination pauses  ? PERMANENT PACEMAKER INSERTION N/A 05/04/2014  ? Procedure: PERMANENT PACEMAKER INSERTION;  Surgeon: Deboraha Sprang, MD;  Location: [facility] CATH LAB;  Service: Cardiovascular;  Laterality: N/A;  ? TONSILLECTOMY    ? ? ?ROS- all systems are reviewed and negative except as per HPI above ? ?Current Outpatient Medications  ?Medication Sig Dispense Refill  ? apixaban (ELIQUIS) 5 MG TABS tablet TAKE (1) TABLET TWICE DAILY. 60 tablet 0  ? calcium-vitamin D 250-100 MG-UNIT tablet Take 1 tablet by mouth 2 (two) times daily.    ? diltiazem (CARDIZEM CD) 240 MG 24 hr capsule TAKE 1 CAPSULE BY MOUTH ONCE DAILY. 90 capsule 3  ? diltiazem (CARDIZEM) 30 MG tablet Take 1 tablet (30 mg total) by mouth as needed. 30 tablet 1  ? pantoprazole (PROTONIX) 20 MG tablet Take 20 mg by mouth every other day.    ? simvastatin (ZOCOR) 10 MG tablet TAKE 1 TABLET DAILY 90 tablet 3  ? Vibegron (GEMTESA) 75 MG TABS Take  1 capsule by mouth daily. 30 tablet 11  ? fluconazole (DIFLUCAN) 150 MG tablet Take 1 tablet (150 mg total) by mouth daily. (Patient not taking: Reported on 12/16/2021) 2 tablet 0  ? ?No current facility-administered medications for this visit.  ? ? ?Physical Exam: ?Vitals:  ? 12/16/21 0853  ?BP: 102/62  ?Pulse: 81  ?SpO2: 99%  ?Weight: 199 lb 3.2 oz (90.4 kg)  ?Height: '5\' 9"'$  (1.753 m)  ? ? ?GEN- The patient is well appearing, alert and oriented x 3 today.   ?Head- normocephalic, atraumatic ?Eyes-  Sclera clear, conjunctiva pink ?Ears- hearing intact ?Oropharynx- clear ?Lungs- Clear to ausculation bilaterally, normal work of breathing ?Chest- pacemaker pocket is well healed ?Heart- Regular rate and rhythm, no murmurs, rubs or gallops, PMI not laterally displaced ?GI- soft, NT, ND, + BS ?Extremities- no clubbing, cyanosis, or edema ? ?Pacemaker interrogation- reviewed in detail today,  See PACEART report ? ?ekg tracing ordered today is personally reviewed and shows atrial paced rhythm ? ?Assessment and Plan: ? ?1. Symptomatic sinus bradycardia  ?Normal pacemaker function ?See Claudia Desanctis Art report ?No changes today ?she is not device dependant today ?RV threshold is chronically elevated.  She does not RV pace and therefore would not require lead revision ? ?2. Paroxysmal atrial fibrillation ?AF Burden is 3 % (previuosly 1.9%) ?Continue eliquis ?Bmet and cbc are ordered today  ?If AF burden increases further,  we should consider AAD (flecainide) or ablation. ?Lifestyle change continues to be difficult ? ?3. overweight ?Body mass index is 29.42 kg/m?. ?Lifestyle change advised ?Fasting lipids ordered ? ?Risks, benefits and potential toxicities for medications prescribed and/or refilled reviewed with patient today.  ? ?Return in a year unless afib progresses in the interim. ? ?Thompson Grayer MD, [facility] ?12/16/2021 ?9:05 AM ? ? ? ?

## 2021-12-16 NOTE — Addendum Note (Signed)
Addended by: Laurine Blazer on: 12/16/2021 03:45 PM ? ? Modules accepted: Orders ? ?

## 2021-12-16 NOTE — Patient Instructions (Signed)
Medication Instructions:  ?Continue all current medications. ? ?Labwork: ?CBC, BMET, FLP - orders given today ?Reminder:  Nothing to eat or drink after 12 midnight prior to labs. ?Office will contact with results via phone or letter.    ? ?Testing/Procedures: ?none ? ?Follow-Up: ?1 year - Dr.  [name]  ? ?Any Other Special Instructions Will Be Listed Below (If Applicable). ? ? ?If you need a refill on your cardiac medications before your next appointment, please call your pharmacy. ? ?

## 2021-12-16 NOTE — Addendum Note (Signed)
Addended by: Laurine Blazer on: 12/16/2021 09:26 AM ? ? Modules accepted: Orders ? ?

## 2021-12-19 ENCOUNTER — Other Ambulatory Visit (HOSPITAL_COMMUNITY)
Admission: RE | Admit: 2021-12-19 | Discharge: 2021-12-19 | Disposition: A | Discharge status: Home / Self Care | Payer: Medicare Other | Source: Ambulatory Visit | Attending: Internal Medicine | Admitting: Internal Medicine

## 2021-12-19 DIAGNOSIS — I495 Sick sinus syndrome: Secondary | ICD-10-CM | POA: Insufficient documentation

## 2021-12-19 DIAGNOSIS — E7800 Pure hypercholesterolemia, unspecified: Secondary | ICD-10-CM | POA: Insufficient documentation

## 2021-12-19 DIAGNOSIS — D6869 Other thrombophilia: Secondary | ICD-10-CM | POA: Insufficient documentation

## 2021-12-19 LAB — BASIC METABOLIC PANEL
Anion gap: 9 (ref 5–15)
BUN: 21 mg/dL (ref 8–23)
CO2: 28 mmol/L (ref 22–32)
Calcium: 9.2 mg/dL (ref 8.9–10.3)
Chloride: 104 mmol/L (ref 98–111)
Creatinine, Ser: 0.74 mg/dL (ref 0.44–1.00)
GFR, Estimated: >60 mL/min (ref >60)
Glucose, Bld: 98 mg/dL (ref 70–99)
Potassium: 4.7 mmol/L (ref 3.5–5.1)
Sodium: 141 mmol/L (ref 135–145)

## 2021-12-19 LAB — CBC
HCT: 39.2 % (ref 36.0–46.0)
Hemoglobin: 12.6 g/dL (ref 12.0–15.0)
MCH: 33.1 pg (ref 26.0–34.0)
MCHC: 32.1 g/dL (ref 30.0–36.0)
MCV: 102.9 fL — ABNORMAL HIGH (ref 80.0–100.0)
Platelets: 199 10*3/uL (ref 150–400)
RBC: 3.81 MIL/uL — ABNORMAL LOW (ref 3.87–5.11)
RDW: 13 % (ref 11.5–15.5)
WBC: 5.2 10*3/uL (ref 4.0–10.5)
nRBC: 0 % (ref 0.0–0.2)

## 2021-12-19 LAB — LIPID PANEL
Cholesterol: 159 mg/dL (ref 0–200)
HDL: 56 mg/dL (ref >40)
LDL Cholesterol: 91 mg/dL (ref 0–99)
Total CHOL/HDL Ratio: 2.8 RATIO
Triglycerides: 59 mg/dL (ref <150)
VLDL: 12 mg/dL (ref 0–40)

## 2021-12-21 ENCOUNTER — Ambulatory Visit: Payer: Medicare Other | Admitting: Urology

## 2021-12-27 ENCOUNTER — Other Ambulatory Visit: Payer: Self-pay | Admitting: Cardiology

## 2021-12-27 NOTE — Telephone Encounter (Signed)
Prescription refill request for Eliquis received. ?Indication: PAF ?Last office visit: 12/16/21  Clearnce Hasten MD ?Scr: 0.74 on 12/19/21 ?Age: 71 ?Weight: 90.4kg ? ?Based on above findings Eliquis '5mg'$  twice daily is the appropriate dose.  Refill approved.  Pt is past due to see Dr Domenic Polite.  Message sent to [facility] scheduler. ? ?

## 2022-01-03 DIAGNOSIS — M79671 Pain in right foot: Secondary | ICD-10-CM | POA: Diagnosis not present

## 2022-01-03 DIAGNOSIS — I10 Essential (primary) hypertension: Secondary | ICD-10-CM | POA: Diagnosis not present

## 2022-01-03 DIAGNOSIS — Z6830 Body mass index (BMI) 30.0-30.9, adult: Secondary | ICD-10-CM | POA: Diagnosis not present

## 2022-01-03 DIAGNOSIS — M79672 Pain in left foot: Secondary | ICD-10-CM | POA: Diagnosis not present

## 2022-01-06 ENCOUNTER — Ambulatory Visit (INDEPENDENT_AMBULATORY_CARE_PROVIDER_SITE_OTHER): Payer: Medicare Other | Admitting: Urology

## 2022-01-06 ENCOUNTER — Encounter: Payer: Self-pay | Admitting: Urology

## 2022-01-06 VITALS — BP 131/74 | HR 80

## 2022-01-06 DIAGNOSIS — R350 Frequency of micturition: Secondary | ICD-10-CM | POA: Diagnosis not present

## 2022-01-06 DIAGNOSIS — N3281 Overactive bladder: Secondary | ICD-10-CM | POA: Diagnosis not present

## 2022-01-06 LAB — URINALYSIS, ROUTINE W REFLEX MICROSCOPIC
Bilirubin, UA: NEGATIVE
Glucose, UA: NEGATIVE
Ketones, UA: NEGATIVE
Leukocytes,UA: NEGATIVE
Nitrite, UA: NEGATIVE
Protein,UA: NEGATIVE
Specific Gravity, UA: 1.02 (ref 1.005–1.030)
Urobilinogen, Ur: 0.2 mg/dL (ref 0.2–1.0)
pH, UA: 6 (ref 5.0–7.5)

## 2022-01-06 MED ORDER — GEMTESA 75 MG PO TABS: 1.0000 | ORAL_TABLET | Freq: Every day | ORAL | 11 refills | Status: DC

## 2022-01-06 NOTE — Patient Instructions (Signed)

## 2022-01-06 NOTE — Progress Notes (Signed)
? ?01/06/2022 ?11:20 AM  ? ?Tiffany Melton ?May 23, 1951 ?[id number] ? ?Referring provider: [facility] ?[place] ?[place],  [place] [address] ? ?Followup OAB ? ? ?HPI: ?Tiffany Melton is a 71yo here for followup for OAB. She is currently on gemtesa '75mg'$  daily. She uses 3-4 pads which are wet. She has urinary urgency and urge incontinence. When she runs out of the gemtesa her urgency and urge incontinence worsen. No other complaints today ? ? ?PMH: ?Past Medical History:  ?Diagnosis Date  ? Abnormal stress test   ? GXT 9/13, cath revealed normal coronary arteries  ? Paroxysmal atrial fibrillation (HCC)   ? Raynaud's disease   ? Sick sinus syndrome ([place])   ? ? ?Surgical History: ?Past Surgical History:  ?Procedure Laterality Date  ? BACK SURGERY    ? CARDIAC CATHETERIZATION  2013  ? CHOLECYSTECTOMY    ? PACEMAKER INSERTION  05-04-14  ? MDT Advisa dual chamber pacemaker implanted by Dr Caryl Comes for SSS, atrial fibrillation post termination pauses  ? PERMANENT PACEMAKER INSERTION N/A 05/04/2014  ? Procedure: PERMANENT PACEMAKER INSERTION;  Surgeon: Deboraha Sprang, MD;  Location: [facility] CATH LAB;  Service: Cardiovascular;  Laterality: N/A;  ? TONSILLECTOMY    ? ? ?Home Medications:  ?Allergies as of 01/06/2022   ?No Known Allergies ?  ? ?  ?Medication List  ?  ? ?  ? Accurate as of January 06, 2022 11:20 AM. If you have any questions, ask your nurse or doctor.  ?  ?  ? ?  ? ?baclofen 10 MG tablet ?Commonly known as: LIORESAL ?Take 10 mg by mouth 3 (three) times daily. ?  ?calcium-vitamin D 250-100 MG-UNIT tablet ?Take 1 tablet by mouth 2 (two) times daily. ?  ?diltiazem 240 MG 24 hr capsule ?Commonly known as: CARDIZEM CD ?TAKE 1 CAPSULE BY MOUTH ONCE DAILY. ?  ?diltiazem 30 MG tablet ?Commonly known as: CARDIZEM ?Take 1 tablet (30 mg total) by mouth as needed. ?  ?Eliquis 5 MG Tabs tablet ?Generic drug: apixaban ?TAKE (1) TABLET TWICE DAILY. ?  ?fluconazole 150 MG tablet ?Commonly known as: DIFLUCAN ?Take 1 tablet (150 mg total) by  mouth daily. ?  ?Gemtesa 75 MG Tabs ?Generic drug: Vibegron ?Take 1 capsule by mouth daily. ?  ?pantoprazole 20 MG tablet ?Commonly known as: PROTONIX ?Take 20 mg by mouth every other day. ?  ?simvastatin 10 MG tablet ?Commonly known as: ZOCOR ?TAKE 1 TABLET DAILY ?  ? ?  ? ? ?Allergies: No Known Allergies ? ?Family History: ?Family History  ?Problem Relation Age of Onset  ? Hypertension Other   ? ? ?Social History:  reports that she quit smoking about 39 years ago. Her smoking use included cigarettes. She started smoking about 59 years ago. She has a 35.00 pack-year smoking history. She has never used smokeless tobacco. She reports that she does not drink alcohol and does not use drugs. ? ?ROS: ?All other review of systems were reviewed and are negative except what is noted above in HPI ? ?Physical Exam: ?BP 131/74   Pulse 80   ?Constitutional:  Alert and oriented, No acute distress. ?HEENT: [place] AT, moist mucus membranes.  Trachea midline, no masses. ?Cardiovascular: No clubbing, cyanosis, or edema. ?Respiratory: Normal respiratory effort, no increased work of breathing. ?GI: Abdomen is soft, nontender, nondistended, no abdominal masses ?GU: No CVA tenderness.  ?Lymph: No cervical or inguinal lymphadenopathy. ?Skin: No rashes, bruises or suspicious lesions. ?Neurologic: Grossly intact, no focal deficits, moving all  4 extremities. ?Psychiatric: Normal mood and affect. ? ?Laboratory Data: ?Lab Results  ?Component Value Date  ? WBC 5.2 12/19/2021  ? HGB 12.6 12/19/2021  ? HCT 39.2 12/19/2021  ? MCV 102.9 (H) 12/19/2021  ? PLT 199 12/19/2021  ? ? ?Lab Results  ?Component Value Date  ? CREATININE 0.74 12/19/2021  ? ? ?No results found for: PSA ? ?No results found for: TESTOSTERONE ? ?Lab Results  ?Component Value Date  ? HGBA1C 5.7 (H) 05/01/2014  ? ? ?Urinalysis ?   ?Component Value Date/Time  ? COLORURINE YELLOW 05/03/2014 1632  ? APPEARANCEUR Clear 09/19/2021 1427  ? LABSPEC 1.019 05/03/2014 1632  ? PHURINE 5.5  05/03/2014 1632  ? GLUCOSEU Negative 09/19/2021 1427  ? HGBUR NEGATIVE 05/03/2014 1632  ? BILIRUBINUR Negative 09/19/2021 1427  ? [place] NEGATIVE 05/03/2014 1632  ? PROTEINUR Negative 09/19/2021 1427  ? PROTEINUR NEGATIVE 05/03/2014 1632  ? UROBILINOGEN 0.2 05/03/2014 1632  ? NITRITE Negative 09/19/2021 1427  ? NITRITE NEGATIVE 05/03/2014 1632  ? LEUKOCYTESUR Trace (A) 09/19/2021 1427  ? ? ?Lab Results  ?Component Value Date  ? LABMICR See below: 09/19/2021  ? [place] 6-10 (A) 09/19/2021  ? LABEPIT >10 (A) 09/19/2021  ? BACTERIA Few 09/19/2021  ? ? ?Pertinent Imaging: ? ?No results found for this or any previous visit. ? ?No results found for this or any previous visit. ? ?No results found for this or any previous visit. ? ?No results found for this or any previous visit. ? ?No results found for this or any previous visit. ? ?No results found for this or any previous visit. ? ?No results found for this or any previous visit. ? ?No results found for this or any previous visit. ? ? ?Assessment & Plan:   ? ?1. Urinary frequency ?-Continue gemtesa '75mg'$   ?- Urinalysis, Routine w reflex microscopic ? ? ?No follow-ups on file. ? ?Nicolette Bang, MD ? ?[facility] [place] ?  ?

## 2022-01-10 ENCOUNTER — Ambulatory Visit (INDEPENDENT_AMBULATORY_CARE_PROVIDER_SITE_OTHER): Payer: Medicare Other

## 2022-01-10 DIAGNOSIS — I495 Sick sinus syndrome: Secondary | ICD-10-CM | POA: Diagnosis not present

## 2022-01-10 LAB — CUP PACEART REMOTE DEVICE CHECK
Battery Remaining Longevity: 31 mo
Battery Voltage: 2.94 V
Brady Statistic AP VP Percent: 3.03 %
Brady Statistic AP VS Percent: 75.1 %
Brady Statistic AS VP Percent: 0.5 %
Brady Statistic AS VS Percent: 21.37 %
Brady Statistic RA Percent Paced: 73.31 %
Brady Statistic RV Percent Paced: 3.4 %
Date Time Interrogation Session: 20230404061455
Implantable Lead Implant Date: 20150727
Implantable Lead Implant Date: 20150727
Implantable Lead Location: 753859
Implantable Lead Location: 753860
Implantable Lead Model: 5076
Implantable Lead Model: 5076
Implantable Pulse Generator Implant Date: 20150727
Lead Channel Impedance Value: 418 Ohm
Lead Channel Impedance Value: 494 Ohm
Lead Channel Impedance Value: 551 Ohm
Lead Channel Impedance Value: 551 Ohm
Lead Channel Pacing Threshold Amplitude: 0.625 V
Lead Channel Pacing Threshold Amplitude: 2.5 V
Lead Channel Pacing Threshold Pulse Width: 0.4 ms
Lead Channel Pacing Threshold Pulse Width: 0.4 ms
Lead Channel Sensing Intrinsic Amplitude: 3 mV
Lead Channel Sensing Intrinsic Amplitude: 3 mV
Lead Channel Sensing Intrinsic Amplitude: 3.75 mV
Lead Channel Sensing Intrinsic Amplitude: 3.75 mV
Lead Channel Setting Pacing Amplitude: 2 V
Lead Channel Setting Pacing Amplitude: 3.5 V
Lead Channel Setting Pacing Pulse Width: 1 ms
Lead Channel Setting Sensing Sensitivity: 2 mV

## 2022-01-24 ENCOUNTER — Telehealth: Payer: Self-pay

## 2022-01-24 DIAGNOSIS — Z20822 Contact with and (suspected) exposure to covid-19: Secondary | ICD-10-CM | POA: Diagnosis not present

## 2022-01-24 DIAGNOSIS — N3281 Overactive bladder: Secondary | ICD-10-CM

## 2022-01-24 MED ORDER — GEMTESA 75 MG PO TABS: 1.0000 | ORAL_TABLET | Freq: Every day | ORAL | 11 refills | Status: DC

## 2022-01-24 NOTE — Telephone Encounter (Signed)
Refill submitted to pharmacy per patient request.  ?

## 2022-01-24 NOTE — Telephone Encounter (Signed)
Patient called advising pharmacy did not receive RX. Patient advised that the samples were working great. Patient advised with her insurance its cheaper to get a 90 day supply at a time. ? ? ?Medication: ?Vibegron (GEMTESA) 75 MG TABS ? ? ?Pharmacy: ?[place], [place] ? ? ?Thank you!  ? ? ? ?

## 2022-01-25 NOTE — Progress Notes (Signed)
Remote pacemaker transmission.   

## 2022-02-06 DIAGNOSIS — H01001 Unspecified blepharitis right upper eyelid: Secondary | ICD-10-CM | POA: Diagnosis not present

## 2022-02-06 DIAGNOSIS — H02834 Dermatochalasis of left upper eyelid: Secondary | ICD-10-CM | POA: Diagnosis not present

## 2022-02-06 DIAGNOSIS — H02831 Dermatochalasis of right upper eyelid: Secondary | ICD-10-CM | POA: Diagnosis not present

## 2022-02-06 DIAGNOSIS — H2513 Age-related nuclear cataract, bilateral: Secondary | ICD-10-CM | POA: Diagnosis not present

## 2022-02-23 ENCOUNTER — Encounter: Payer: Self-pay | Admitting: Cardiology

## 2022-02-23 ENCOUNTER — Ambulatory Visit (INDEPENDENT_AMBULATORY_CARE_PROVIDER_SITE_OTHER): Payer: Medicare Other | Admitting: Cardiology

## 2022-02-23 VITALS — BP 118/80 | HR 64 | Ht 69.0 in | Wt 191.6 lb

## 2022-02-23 DIAGNOSIS — I480 Paroxysmal atrial fibrillation: Secondary | ICD-10-CM | POA: Diagnosis not present

## 2022-02-23 DIAGNOSIS — I495 Sick sinus syndrome: Secondary | ICD-10-CM | POA: Diagnosis not present

## 2022-02-23 MED ORDER — PANTOPRAZOLE SODIUM 20 MG PO TBEC: 20.0000 mg | DELAYED_RELEASE_TABLET | Freq: Every day | ORAL | 3 refills | Status: DC

## 2022-02-23 NOTE — Progress Notes (Signed)
Cardiology Office Note  Date: 02/23/2022   ID: [name], DOB June 11, 1951, MRN [id number]  PCP:  [facility]  Cardiologist:  Rozann Lesches, MD Electrophysiologist:  Thompson Grayer, MD   Chief Complaint  Patient presents with   Cardiac follow-up    History of Present Illness: Tiffany Melton is a 71 y.o. female last seen in April 2022.  She is here for a routine visit.  Does not report any major progression in sense of intermittent palpitations.  Did have an episode over the weekend.  Otherwise no change in stamina or shortness of breath with typical activities.  She continues to follow with [facility] for primary care.  Medtronic pacemaker in place with follow-up by Dr. [name].  I reviewed his office note from March.  She has had relatively low atrial fibrillation burden, approximately 4% by most recent interrogation.  CHA2DS2-VASc score is 2.  She has tolerated Eliquis well, no spontaneous bleeding problems.  I reviewed the remainder of her medications as noted below.  Past Medical History:  Diagnosis Date   Abnormal stress test    GXT 9/13, cath revealed normal coronary arteries   Paroxysmal atrial fibrillation (HCC)    Raynaud's disease    Sick sinus syndrome [facility])     Past Surgical History:  Procedure Laterality Date   BACK SURGERY     CARDIAC CATHETERIZATION  2013   CHOLECYSTECTOMY     PACEMAKER INSERTION  05-04-14   MDT Advisa dual chamber pacemaker implanted by Dr Caryl Comes for SSS, atrial fibrillation post termination pauses   PERMANENT PACEMAKER INSERTION N/A 05/04/2014   Procedure: PERMANENT PACEMAKER INSERTION;  Surgeon: Deboraha Sprang, MD;  Location: [facility] C[facility] LAB;  Service: Cardiovascular;  Laterality: N/A;   TONSILLECTOMY      Current Outpatient Medications  Medication Sig Dispense Refill   apixaban (ELIQUIS) 5 MG TABS tablet TAKE (1) TABLET TWICE DAILY. 60 tablet 5   calcium-vitamin D 250-100 MG-UNIT tablet Take 1 tablet by mouth 2 (two) times daily.      diltiazem (CARDIZEM CD) 240 MG 24 hr capsule TAKE 1 CAPSULE BY MOUTH ONCE DAILY. 90 capsule 3   diltiazem (CARDIZEM) 30 MG tablet Take 1 tablet (30 mg total) by mouth as needed. 30 tablet 1   simvastatin (ZOCOR) 10 MG tablet TAKE 1 TABLET DAILY 90 tablet 3   Vibegron (GEMTESA) 75 MG TABS Take 1 capsule by mouth daily. 30 tablet 11   pantoprazole (PROTONIX) 20 MG tablet Take 1 tablet (20 mg total) by mouth daily. 90 tablet 3   No current facility-administered medications for this visit.   Allergies:  Patient has no known allergies.   ROS: No orthopnea or PND.  Physical Exam: VS:  BP 118/80   Pulse 64   Ht '5\' 9"'$  (1.753 m)   Wt 191 lb 9.6 oz (86.9 kg)   SpO2 97%   BMI 28.29 kg/m , BMI Body mass index is 28.29 kg/m.  Wt Readings from Last 3 Encounters:  02/23/22 191 lb 9.6 oz (86.9 kg)  12/16/21 199 lb 3.2 oz (90.4 kg)  01/24/21 191 lb (86.6 kg)    General: Patient appears comfortable at rest. HEENT: Conjunctiva and lids normal. Neck: Supple, no elevated JVP or carotid bruits, no thyromegaly. Lungs: Clear to auscultation, nonlabored breathing at rest. Cardiac: Regular rate and rhythm, no S3 or significant systolic murmur, no pericardial rub. Extremities: No pitting edema.  ECG:  An ECG dated 12/16/2021 was personally reviewed today and demonstrated:  Atrial paced rhythm.  Recent Labwork: 12/19/2021: BUN 21; Creatinine, Ser 0.74; Hemoglobin 12.6; Platelets 199; Potassium 4.7; Sodium 141     Component Value Date/Time   CHOL 159 12/19/2021 0929   TRIG 59 12/19/2021 0929   HDL 56 12/19/2021 0929   CHOLHDL 2.8 12/19/2021 0929   VLDL 12 12/19/2021 0929   LDLCALC 91 12/19/2021 0929    Other Studies Reviewed Today:  Echocardiogram 02/06/2019:  1. The left ventricle has hyperdynamic systolic function, with an ejection fraction of >65%. The cavity size was normal. Left ventricular diastolic parameters were normal. No evidence of left ventricular regional wall motion  abnormalities.  2. The right ventricle has normal systolic function. The cavity was normal. There is no increase in right ventricular wall thickness. Right ventricular systolic pressure normal with an estimated pressure of 22.9 mmHg.  3. The pericardial effusion is posterior to the left ventricle.  4. Trivial pericardial effusion is present.  5. The aortic valve is tricuspid. Mild calcification of the aortic valve. Mild aortic annular calcification noted.  6. The mitral valve is grossly normal. Mild thickening of the mitral valve leaflet.  7. The tricuspid valve is grossly normal.  8. The aortic root is normal in size and structure.   Lexiscan Myoview 02/09/2020: No diagnostic ST segment changes to indicate ischemia. Small, mild intensity, apical anterior defect that is fixed and consistent with breast attenuation. No reversible defects to indicate ischemia. This is a low risk study. Nuclear stress EF: 69%.  Assessment and Plan:  1.  Paroxysmal atrial fibrillation with CHA2DS2-VASc score of 2.  Overall stable intermittent palpitations with 4% rhythm burden by most recent device interrogation.  Plan to continue Cardizem CD along with Eliquis.  If rhythm burden were to significantly increase, would consider antiarrhythmic therapy or ablation.  2.  Sick sinus syndrome with Medtronic pacemaker in place and continued follow-up by Dr. [name].  Medication Adjustments/Labs and Tests Ordered: Current medicines are reviewed at length with the patient today.  Concerns regarding medicines are outlined above.   Tests Ordered: No orders of the defined types were placed in this encounter.   Medication Changes: Meds ordered this encounter  Medications   pantoprazole (PROTONIX) 20 MG tablet    Sig: Take 1 tablet (20 mg total) by mouth daily.    Dispense:  90 tablet    Refill:  3    Disposition:  Follow up  1 year, sooner if needed.  Signed, Satira Sark, MD, [facility] 02/23/2022 10:06 AM     [facility] [place], [place], [place] [address] Phone: [phone]; Fax: [phone]

## 2022-02-23 NOTE — Patient Instructions (Signed)

## 2022-04-06 ENCOUNTER — Encounter (HOSPITAL_COMMUNITY): Payer: Self-pay

## 2022-04-06 NOTE — H&P (Signed)
Surgical History & Physical  Patient Name: Tiffany Melton DOB: 18-Oct-1950  Surgery: Cataract extraction with intraocular lens implant phacoemulsification; Left Eye  Surgeon: Baruch Goldmann MD Surgery Date:  04-17-22 Pre-Op Date:  03-30-22  HPI: A 44 Yr. old female patient 1. The patient complains of difficulty when seeing street signs, which began 2 years ago. Both eyes are affected. The episode is gradual. The condition's severity increased since last visit. Symptoms occur when the patient is driving and outside. is referred by Dr Hassell Done for cataract eval. This is negatively affecting the patient's quality of life and the patient is unable to function adequately in life with the current level of vision. HPI was performed by Baruch Goldmann .  Medical History: Arthritis Heart Problem LDL  Review of Systems Negative Allergic/Immunologic Negative Cardiovascular Negative Constitutional Negative Ear, Nose, Mouth & Throat Negative Endocrine Negative Eyes Negative Gastrointestinal Negative Genitourinary Negative Integumentary Negative Musculoskeletal Negative Neurological Negative Psychiatry Negative Respiratory  Social   Former smoker   Medication  Baclofen, Diltiazem, Eliquis, Pantoprazole, Tylenol, Simvastatin, Gentosa, Diltiazem, Calcium Supplement,   Sx/Procedures RK OU,  Pacemaker, Gallbladder, Back Surgery,   Drug Allergies   NKDA  History & Physical: Heent: Cataract, left eye NECK: supple without bruits LUNGS: lungs clear to auscultation CV: regular rate and rhythm Abdomen: soft and non-tender Impression & Plan: Assessment: 1.  NUCLEAR SCLEROSIS AGE RELATED; Both Eyes (H25.13) 2.  DERMATOCHALASIS, no surgery; Right Upper Lid, Left Upper Lid (H02.831, H02.834) 3.  BLEPHARITIS; Right Upper Lid, Right Lower Lid, Left Upper Lid, Left Lower Lid (H01.001, H01.002,H01.004,H01.005)  Plan: 1.  Cataract accounts for the patient's decreased vision. This visual impairment is  not correctable with a tolerable change in glasses or contact lenses. Cataract surgery with an implantation of a new lens should significantly improve the visual and functional status of the patient. Discussed all risks, benefits, alternatives, and potential complications. Discussed the procedures and recovery. Patient desires to have surgery. A-scan ordered and performed today for intra-ocular lens calculations. The surgery will be performed in order to improve vision for driving, reading, and for eye examinations. Recommend phacoemulsification with intra-ocular lens. Recommend Dextenza for post-operative pain and inflammation. Left Eye. worse - first. Dilates well - shugarcaine by protocol. S/P 4 cut RK - Barrett True K.  2.  Asymptomatic, recommend observation for now. Findings, prognosis and treatment options reviewed.  3.  Recommend regular lid cleaning

## 2022-04-07 ENCOUNTER — Encounter (HOSPITAL_COMMUNITY)
Admission: RE | Admit: 2022-04-07 | Discharge: 2022-04-07 | Disposition: A | Discharge status: Home / Self Care | Payer: Medicare Other | Source: Ambulatory Visit | Attending: Ophthalmology | Admitting: Ophthalmology

## 2022-04-07 HISTORY — DX: Presence of cardiac pacemaker: Z95.0

## 2022-04-07 HISTORY — DX: Cardiac arrhythmia, unspecified: I49.9

## 2022-04-07 HISTORY — DX: Gastro-esophageal reflux disease without esophagitis: K21.9

## 2022-04-07 HISTORY — DX: Sleep apnea, unspecified: G47.30

## 2022-04-10 DIAGNOSIS — H2512 Age-related nuclear cataract, left eye: Secondary | ICD-10-CM | POA: Diagnosis not present

## 2022-04-12 ENCOUNTER — Ambulatory Visit (INDEPENDENT_AMBULATORY_CARE_PROVIDER_SITE_OTHER): Payer: Medicare Other

## 2022-04-12 DIAGNOSIS — I495 Sick sinus syndrome: Secondary | ICD-10-CM

## 2022-04-14 ENCOUNTER — Ambulatory Visit: Payer: Medicare Other | Admitting: Urology

## 2022-04-15 LAB — CUP PACEART REMOTE DEVICE CHECK
Battery Remaining Longevity: 28 mo
Battery Voltage: 2.94 V
Brady Statistic AP VP Percent: 1.08 %
Brady Statistic AP VS Percent: 77.16 %
Brady Statistic AS VP Percent: 0.46 %
Brady Statistic AS VS Percent: 21.3 %
Brady Statistic RA Percent Paced: 73.75 %
Brady Statistic RV Percent Paced: 1.55 %
Date Time Interrogation Session: 20230705103546
Implantable Lead Implant Date: 20150727
Implantable Lead Implant Date: 20150727
Implantable Lead Location: 753859
Implantable Lead Location: 753860
Implantable Lead Model: 5076
Implantable Lead Model: 5076
Implantable Pulse Generator Implant Date: 20150727
Lead Channel Impedance Value: 399 Ohm
Lead Channel Impedance Value: 494 Ohm
Lead Channel Impedance Value: 532 Ohm
Lead Channel Impedance Value: 532 Ohm
Lead Channel Pacing Threshold Amplitude: 0.75 V
Lead Channel Pacing Threshold Pulse Width: 0.4 ms
Lead Channel Sensing Intrinsic Amplitude: 3.375 mV
Lead Channel Sensing Intrinsic Amplitude: 3.375 mV
Lead Channel Sensing Intrinsic Amplitude: 3.375 mV
Lead Channel Sensing Intrinsic Amplitude: 3.375 mV
Lead Channel Setting Pacing Amplitude: 2 V
Lead Channel Setting Pacing Amplitude: 3.5 V
Lead Channel Setting Pacing Pulse Width: 1 ms
Lead Channel Setting Sensing Sensitivity: 2 mV

## 2022-04-17 ENCOUNTER — Ambulatory Visit (HOSPITAL_COMMUNITY): Payer: Medicare Other | Admitting: Anesthesiology

## 2022-04-17 ENCOUNTER — Encounter (HOSPITAL_COMMUNITY)
Admission: RE | Disposition: A | Discharge status: Home / Self Care | Payer: Self-pay | Source: Home / Self Care | Attending: Ophthalmology

## 2022-04-17 ENCOUNTER — Ambulatory Visit (HOSPITAL_BASED_OUTPATIENT_CLINIC_OR_DEPARTMENT_OTHER): Payer: Medicare Other | Admitting: Anesthesiology

## 2022-04-17 ENCOUNTER — Ambulatory Visit (HOSPITAL_COMMUNITY)
Admission: RE | Admit: 2022-04-17 | Discharge: 2022-04-17 | Disposition: A | Discharge status: Home / Self Care | Payer: Medicare Other | Attending: Ophthalmology | Admitting: Ophthalmology

## 2022-04-17 ENCOUNTER — Other Ambulatory Visit: Payer: Self-pay

## 2022-04-17 ENCOUNTER — Encounter (HOSPITAL_COMMUNITY): Payer: Self-pay | Admitting: Ophthalmology

## 2022-04-17 DIAGNOSIS — Z9989 Dependence on other enabling machines and devices: Secondary | ICD-10-CM

## 2022-04-17 DIAGNOSIS — G4733 Obstructive sleep apnea (adult) (pediatric): Secondary | ICD-10-CM

## 2022-04-17 DIAGNOSIS — H2512 Age-related nuclear cataract, left eye: Secondary | ICD-10-CM | POA: Insufficient documentation

## 2022-04-17 DIAGNOSIS — H5712 Ocular pain, left eye: Secondary | ICD-10-CM | POA: Diagnosis not present

## 2022-04-17 DIAGNOSIS — G4730 Sleep apnea, unspecified: Secondary | ICD-10-CM | POA: Insufficient documentation

## 2022-04-17 DIAGNOSIS — K219 Gastro-esophageal reflux disease without esophagitis: Secondary | ICD-10-CM | POA: Diagnosis not present

## 2022-04-17 DIAGNOSIS — Z950 Presence of cardiac pacemaker: Secondary | ICD-10-CM | POA: Insufficient documentation

## 2022-04-17 DIAGNOSIS — H02834 Dermatochalasis of left upper eyelid: Secondary | ICD-10-CM | POA: Insufficient documentation

## 2022-04-17 DIAGNOSIS — H269 Unspecified cataract: Secondary | ICD-10-CM

## 2022-04-17 DIAGNOSIS — Z87891 Personal history of nicotine dependence: Secondary | ICD-10-CM | POA: Insufficient documentation

## 2022-04-17 DIAGNOSIS — I4891 Unspecified atrial fibrillation: Secondary | ICD-10-CM | POA: Diagnosis not present

## 2022-04-17 DIAGNOSIS — H0100A Unspecified blepharitis right eye, upper and lower eyelids: Secondary | ICD-10-CM | POA: Insufficient documentation

## 2022-04-17 DIAGNOSIS — H0100B Unspecified blepharitis left eye, upper and lower eyelids: Secondary | ICD-10-CM | POA: Diagnosis not present

## 2022-04-17 DIAGNOSIS — H59092 Other disorders of the left eye following cataract surgery: Secondary | ICD-10-CM

## 2022-04-17 DIAGNOSIS — H02831 Dermatochalasis of right upper eyelid: Secondary | ICD-10-CM | POA: Diagnosis not present

## 2022-04-17 DIAGNOSIS — I480 Paroxysmal atrial fibrillation: Secondary | ICD-10-CM | POA: Insufficient documentation

## 2022-04-17 SURGERY — CATARACT EXTRACTION PHACO AND INTRAOCULAR LENS PLACEMENT (IOC) with placement of Corticosteroid
Anesthesia: Monitor Anesthesia Care | Site: Eye | Laterality: Left

## 2022-04-17 MED ORDER — STERILE WATER FOR IRRIGATION IR SOLN
Status: DC | PRN
  Administered 2022-04-17: 250 mL

## 2022-04-17 MED ORDER — PHENYLEPHRINE HCL 2.5 % OP SOLN
1.0000 [drp] | OPHTHALMIC | Status: AC | PRN
  Administered 2022-04-17 (×3): 1 [drp] via OPHTHALMIC

## 2022-04-17 MED ORDER — ORAL CARE MOUTH RINSE: 15.0000 mL | Freq: Once | OROMUCOSAL | Status: DC

## 2022-04-17 MED ORDER — TRYPAN BLUE 0.06 % IO SOSY
PREFILLED_SYRINGE | INTRAOCULAR | Status: AC
  Filled 2022-04-17: qty 0.5

## 2022-04-17 MED ORDER — LIDOCAINE HCL (PF) 1 % IJ SOLN
INTRAOCULAR | Status: DC | PRN
  Administered 2022-04-17: 1 mL via OPHTHALMIC

## 2022-04-17 MED ORDER — SODIUM HYALURONATE 10 MG/ML IO SOLUTION
PREFILLED_SYRINGE | INTRAOCULAR | Status: DC | PRN
  Administered 2022-04-17: 0.85 mL via INTRAOCULAR

## 2022-04-17 MED ORDER — SODIUM CHLORIDE 0.9% FLUSH
INTRAVENOUS | Status: DC | PRN
  Administered 2022-04-17: 5 mL via INTRAVENOUS

## 2022-04-17 MED ORDER — EPINEPHRINE PF 1 MG/ML IJ SOLN
INTRAOCULAR | Status: DC | PRN
  Administered 2022-04-17: 500 mL

## 2022-04-17 MED ORDER — LIDOCAINE HCL 3.5 % OP GEL
1.0000 | Freq: Once | OPHTHALMIC | Status: AC
Start: 2022-04-17 — End: 2022-04-17
  Administered 2022-04-17: 1 via OPHTHALMIC

## 2022-04-17 MED ORDER — DEXAMETHASONE 0.4 MG OP INST
VAGINAL_INSERT | OPHTHALMIC | Status: AC
  Filled 2022-04-17: qty 1

## 2022-04-17 MED ORDER — MIDAZOLAM HCL 2 MG/2ML IJ SOLN
INTRAMUSCULAR | Status: AC
  Filled 2022-04-17: qty 2

## 2022-04-17 MED ORDER — POVIDONE-IODINE 5 % OP SOLN
OPHTHALMIC | Status: DC | PRN
  Administered 2022-04-17: 1 via OPHTHALMIC

## 2022-04-17 MED ORDER — BSS IO SOLN
INTRAOCULAR | Status: DC | PRN
  Administered 2022-04-17: 15 mL via INTRAOCULAR

## 2022-04-17 MED ORDER — DEXAMETHASONE 0.4 MG OP INST
VAGINAL_INSERT | OPHTHALMIC | Status: DC | PRN
  Administered 2022-04-17: 0.4 mg via OPHTHALMIC

## 2022-04-17 MED ORDER — CHLORHEXIDINE GLUCONATE 0.12 % MT SOLN: 15.0000 mL | Freq: Once | OROMUCOSAL | Status: DC

## 2022-04-17 MED ORDER — TROPICAMIDE 1 % OP SOLN
1.0000 [drp] | OPHTHALMIC | Status: AC | PRN
Start: 2022-04-17 — End: 2022-04-17
  Administered 2022-04-17 (×3): 1 [drp] via OPHTHALMIC

## 2022-04-17 MED ORDER — TETRACAINE HCL 0.5 % OP SOLN
1.0000 [drp] | OPHTHALMIC | Status: AC | PRN
  Administered 2022-04-17 (×3): 1 [drp] via OPHTHALMIC

## 2022-04-17 MED ORDER — SODIUM HYALURONATE 23MG/ML IO SOSY
PREFILLED_SYRINGE | INTRAOCULAR | Status: DC | PRN
  Administered 2022-04-17: 0.6 mL via INTRAOCULAR

## 2022-04-17 MED ORDER — EPINEPHRINE PF 1 MG/ML IJ SOLN
INTRAMUSCULAR | Status: AC
  Filled 2022-04-17: qty 2

## 2022-04-17 MED ORDER — MIDAZOLAM HCL 2 MG/2ML IJ SOLN
INTRAMUSCULAR | Status: DC | PRN
  Administered 2022-04-17: 2 mg via INTRAVENOUS

## 2022-04-17 SURGICAL SUPPLY — 13 items
CATARACT SUITE SIGHTPATH (MISCELLANEOUS) ×2 IMPLANT
CLOTH BEACON ORANGE TIMEOUT ST (SAFETY) ×2 IMPLANT
EYE SHIELD UNIVERSAL CLEAR (GAUZE/BANDAGES/DRESSINGS) ×1 IMPLANT
FEE CATARACT SUITE SIGHTPATH (MISCELLANEOUS) ×1 IMPLANT
GLOVE BIOGEL PI IND STRL 7.0 (GLOVE) ×2 IMPLANT
GLOVE BIOGEL PI INDICATOR 7.0 (GLOVE) ×2
LENS IOL RAYNER 21.0 (Intraocular Lens) ×2 IMPLANT
LENS IOL RAYONE EMV 21.0 (Intraocular Lens) IMPLANT
PAD ARMBOARD 7.5X6 YLW CONV (MISCELLANEOUS) ×2 IMPLANT
SYR TB 1ML LL NO SAFETY (SYRINGE) ×2 IMPLANT
TAPE SURG TRANSPORE 1 IN (GAUZE/BANDAGES/DRESSINGS) IMPLANT
TAPE SURGICAL TRANSPORE 1 IN (GAUZE/BANDAGES/DRESSINGS) ×1
WATER STERILE IRR 250ML POUR (IV SOLUTION) ×2 IMPLANT

## 2022-04-17 NOTE — Anesthesia Preprocedure Evaluation (Signed)
Anesthesia Evaluation    Reviewed: Allergy & Precautions, Patient's Chart, lab work & pertinent test results  Airway Mallampati: II  TM Distance: >3 FB Neck ROM: Full    Dental no notable dental hx.    Pulmonary sleep apnea and Continuous Positive Airway Pressure Ventilation , former smoker,    Pulmonary exam normal        Cardiovascular Exercise Tolerance: Good + dysrhythmias Atrial Fibrillation + pacemaker  Rhythm:Irregular  ECHO EF 65%   Neuro/Psych negative neurological ROS     GI/Hepatic Neg liver ROS, GERD  ,  Endo/Other  negative endocrine ROS  Renal/GU negative Renal ROS     Musculoskeletal negative musculoskeletal ROS (+)   Abdominal Normal abdominal exam  (+)   Peds  Hematology negative hematology ROS (+)   Anesthesia Other Findings   Reproductive/Obstetrics                             Anesthesia Physical Anesthesia Plan  ASA: 3  Anesthesia Plan: MAC   Post-op Pain Management:    Induction:   PONV Risk Score and Plan: Midazolam  Airway Management Planned: Nasal Cannula  Additional Equipment:   Intra-op Plan:   Post-operative Plan:   Informed Consent: I have reviewed the patients History and Physical, chart, labs and discussed the procedure including the risks, benefits and alternatives for the proposed anesthesia with the patient or authorized representative who has indicated his/her understanding and acceptance.       Plan Discussed with: CRNA  Anesthesia Plan Comments:         Anesthesia Quick Evaluation

## 2022-04-17 NOTE — Anesthesia Procedure Notes (Signed)
Procedure Name: MAC Date/Time: 04/17/2022 12:28 PM  Performed by: Orlie Dakin, CRNAPre-anesthesia Checklist: Patient identified, Emergency Drugs available, Suction available and Patient being monitored Patient Re-evaluated:Patient Re-evaluated prior to induction Oxygen Delivery Method: Nasal cannula Placement Confirmation: positive ETCO2

## 2022-04-17 NOTE — Discharge Instructions (Addendum)
Please discharge patient when stable, will follow up today with Dr. Wrzosek at the [facility] office immediately following discharge.  Leave shield in place until visit.  All paperwork with discharge instructions will be given at the office.  [facility] Address:  [address]  [place], [place] [address]  

## 2022-04-17 NOTE — Interval H&P Note (Signed)
History and Physical Interval Note:  04/17/2022 12:17 PM  Tiffany Melton  has presented today for surgery, with the diagnosis of nuclear sclerosis age related cataract; left.  The various methods of treatment have been discussed with the patient and family. After consideration of risks, benefits and other options for treatment, the patient has consented to  Procedure(s) with comments: CATARACT EXTRACTION PHACO AND INTRAOCULAR LENS PLACEMENT ([place]) with placement of Corticosteroid (Left) - CDE:  as a surgical intervention.  The patient's history has been reviewed, patient examined, no change in status, stable for surgery.  I have reviewed the patient's chart and labs.  Questions were answered to the patient's satisfaction.     Baruch Goldmann

## 2022-04-17 NOTE — Anesthesia Postprocedure Evaluation (Signed)
Anesthesia Post Note  Patient: Tiffany Melton  Procedure(s) Performed: CATARACT EXTRACTION PHACO AND INTRAOCULAR LENS PLACEMENT ([facility]) with placement of Corticosteroid (Left: Eye)  Patient location during evaluation: PACU Anesthesia Type: MAC Level of consciousness: awake and alert Pain management: pain level controlled Vital Signs Assessment: post-procedure vital signs reviewed and stable Respiratory status: spontaneous breathing, nonlabored ventilation, respiratory function stable and patient connected to nasal cannula oxygen Cardiovascular status: stable and blood pressure returned to baseline Postop Assessment: no apparent nausea or vomiting Anesthetic complications: no   There were no known notable events for this encounter.   Last Vitals:  Vitals:   04/17/22 1140 04/17/22 1244  BP: 128/66 112/70  Pulse: 70 62  Resp:  18  Temp: 36.6 C (!) 36.4 C  SpO2: 99% 99%    Last Pain:  Vitals:   04/17/22 1244  TempSrc: Axillary  PainSc: 0-No pain                 Trixie Rude

## 2022-04-17 NOTE — Transfer of Care (Signed)
Immediate Anesthesia Transfer of Care Note  Patient: Tiffany Melton  Procedure(s) Performed: CATARACT EXTRACTION PHACO AND INTRAOCULAR LENS PLACEMENT ([facility]) with placement of Corticosteroid (Left: Eye)  Patient Location: Short Stay  Anesthesia Type:MAC  Level of Consciousness: awake, alert  and oriented  Airway & Oxygen Therapy: Patient Spontanous Breathing  Post-op Assessment: Report given to RN and Post -op Vital signs reviewed and stable  Post vital signs: Reviewed and stable  Last Vitals:  Vitals Value Taken Time  BP    Temp    Pulse    Resp    SpO2      Last Pain:  Vitals:   04/17/22 1140  TempSrc: Oral  PainSc: 0-No pain      Patients Stated Pain Goal: 8 (15/61/53 7943)  Complications: No notable events documented.

## 2022-04-17 NOTE — Op Note (Signed)
Date of procedure: 04/17/22  Pre-operative diagnosis: Visually significant age-related nuclear cataract, Left Eye (H25.12)  Post-operative diagnosis:  Visually significant age-related nuclear cataract, Left Eye (H25.12) 2.   Pain and inflammation following cataract surgery, Left Eye (H57.12)  Procedure:  Removal of cataract via phacoemulsification and insertion of intra-ocular lens Rayner [id number] +21.0D into the capsular bag of the Left Eye 2. Placement of Dextenza Implant, Left Lower Lid  Attending surgeon: Gerda Diss. Makaiyah Schweiger, MD, MA  Anesthesia: MAC, Topical Akten  Complications: None  Estimated Blood Loss: <43m (minimal)  Specimens: None  Implants: As above  Indications:  Visually significant age-related cataract, Left Eye  Procedure:  The patient was seen and identified in the pre-operative area. The operative eye was identified and dilated.  The operative eye was marked.  Topical anesthesia was administered to the operative eye.     The patient was then to the operative suite and placed in the supine position.  A timeout was performed confirming the patient, procedure to be performed, and all other relevant information.   The patient's face was prepped and draped in the usual fashion for intra-ocular surgery.  A lid speculum was placed into the operative eye and the surgical microscope moved into place and focused.  An inferotemporal paracentesis was created using a 20 gauge paracentesis blade.  Shugarcaine was injected into the anterior chamber.  Viscoelastic was injected into the anterior chamber.  A temporal clear-corneal main wound incision was created using a 2.461mmicrokeratome.  A continuous curvilinear capsulorrhexis was initiated using an irrigating cystitome and completed using capsulorrhexis forceps.  Hydrodissection and hydrodeliniation were performed.  Viscoelastic was injected into the anterior chamber.  A phacoemulsification handpiece and a chopper as a second  instrument were used to remove the nucleus and epinucleus. The irrigation/aspiration handpiece was used to remove any remaining cortical material.   The capsular bag was reinflated with viscoelastic, checked, and found to be intact.  The intraocular lens was inserted into the capsular bag.  The irrigation/aspiration handpiece was used to remove any remaining viscoelastic.  The clear corneal wound and paracentesis wounds were then hydrated and checked with Weck-Cels to be watertight.    The lid-speculum was removed. The lower punctum was dilated. A Dextenza implant was placed in the lower canaliculus without complication.  The drape was removed.  The patient's face was cleaned with a wet and dry 4x4.   A clear shield was taped over the eye. The patient was taken to the post-operative care unit in good condition, having tolerated the procedure well.  Post-Op Instructions: [facility] and will receive all other orders and instructions.

## 2022-04-21 ENCOUNTER — Encounter (HOSPITAL_COMMUNITY)
Admission: RE | Admit: 2022-04-21 | Discharge: 2022-04-21 | Disposition: A | Discharge status: Home / Self Care | Payer: Medicare Other | Source: Ambulatory Visit | Attending: Ophthalmology | Admitting: Ophthalmology

## 2022-04-24 DIAGNOSIS — H2511 Age-related nuclear cataract, right eye: Secondary | ICD-10-CM | POA: Diagnosis not present

## 2022-04-27 NOTE — H&P (Signed)
Surgical History & Physical  Patient Name: Tiffany Melton DOB: 1951-08-20  Surgery: Cataract extraction with intraocular lens implant phacoemulsification; Right Eye  Surgeon: Baruch Goldmann MD Surgery Date:  05-01-22 Pre-Op Date:  04-24-22  HPI: A 72 Yr. old female patient 1. The patient is returning after cataract post-op. The left eye is affected. Status post cataract post-op, which began 1 week ago: Since the last visit, the affected area is doing well. The patient's vision is improved. Patient is following medication instructions. 2. 2. The patient complains of difficulty when seeing street signs, which began 2 years ago. The right eye is affected. The episode is gradual. The condition's severity increased since last visit. Symptoms occur when the patient is driving and outside. This is negatively affecting the patient's quality of life and the patient is unable to function adequately in life with the current level of vision. HPI was performed by Baruch Goldmann .  Medical History: Arthritis Heart Problem LDL   Review of Systems Negative Allergic/Immunologic Negative Cardiovascular Negative Constitutional Negative Ear, Nose, Mouth & Throat Negative Endocrine Negative Eyes Negative Gastrointestinal Negative Genitourinary Negative Integumentary Negative Musculoskeletal Negative Neurological Negative Psychiatry Negative Respiratory  Social   Former smoker   Medication  Baclofen, Diltiazem, Eliquis, Pantoprazole, Tylenol, Simvastatin, Gentosa, Diltiazem, Calcium Supplement,   Sx/Procedures RK OU, Phaco c IOL OS wih Dextenza,  Pacemaker, Gallbladder, Back Surgery,   Drug Allergies   NKDA  History & Physical: Heent: Cataract, right eye NECK: supple without bruits LUNGS: lungs clear to auscultation CV: regular rate and rhythm Abdomen: soft and non-tender Impression & Plan: Assessment: 1.  CATARACT EXTRACTION STATUS; Left Eye (Z98.42) 2.  NUCLEAR SCLEROSIS AGE RELATED; ,  Right Eye (H25.11) 3.  Hyperopia ; Left Eye Both Eyes (H52.03)  Plan: 1.  1 week after cataract surgery. Doing well with improved vision and normal eye pressure. Call with any problems or concerns. Stop drops - Dextenza. Call with any concerning symptoms.  2.  Dilates well - shugarcaine by protocol. S/P 4 cut RK - Barrett True K. Cataract accounts for the patient's decreased vision. This visual impairment is not correctable with a tolerable change in glasses or contact lenses. Cataract surgery with an implantation of a new lens should significantly improve the visual and functional status of the patient. Discussed all risks, benefits, alternatives, and potential complications. Discussed the procedures and recovery. Patient desires to have surgery. A-scan ordered and performed today for intra-ocular lens calculations. The surgery will be performed in order to improve vision for driving, reading, and for eye examinations. Recommend phacoemulsification with intra-ocular lens. Recommend Dextenza for post-operative pain and inflammation. Right Eye. Surgery required to correct imbalance of vision.  3.

## 2022-04-28 ENCOUNTER — Encounter: Payer: Self-pay | Admitting: Urology

## 2022-04-28 ENCOUNTER — Ambulatory Visit (INDEPENDENT_AMBULATORY_CARE_PROVIDER_SITE_OTHER): Payer: Medicare Other | Admitting: Urology

## 2022-04-28 VITALS — BP 118/64 | HR 97

## 2022-04-28 DIAGNOSIS — R350 Frequency of micturition: Secondary | ICD-10-CM

## 2022-04-28 DIAGNOSIS — N3281 Overactive bladder: Secondary | ICD-10-CM | POA: Diagnosis not present

## 2022-04-28 LAB — MICROSCOPIC EXAMINATION: Renal Epithel, UA: NONE SEEN /HPF

## 2022-04-28 LAB — URINALYSIS, ROUTINE W REFLEX MICROSCOPIC
Bilirubin, UA: NEGATIVE
Glucose, UA: NEGATIVE
Ketones, UA: NEGATIVE
Nitrite, UA: NEGATIVE
Protein,UA: NEGATIVE
Specific Gravity, UA: 1.015 (ref 1.005–1.030)
Urobilinogen, Ur: 0.2 mg/dL (ref 0.2–1.0)
pH, UA: 7 (ref 5.0–7.5)

## 2022-04-28 MED ORDER — GEMTESA 75 MG PO TABS: 1.0000 | ORAL_TABLET | Freq: Every day | ORAL | 11 refills | Status: DC

## 2022-04-28 NOTE — Patient Instructions (Signed)

## 2022-04-28 NOTE — Progress Notes (Signed)
04/28/2022 10:43 AM   [name] 29-Aug-1951 [id number]  Referring provider: Practice, [address],  [place] [address]  Followup OAB   HPI: Ms Berrett is a 71yo here for followup for OAB and urinary frequency. She is doing well on gemtesa '75mg'$  daily. She has rare urinary incontinence. NO nocturia    PMH: Past Medical History:  Diagnosis Date   Abnormal stress test    GXT 9/13, cath revealed normal coronary arteries   Dysrhythmia    GERD (gastroesophageal reflux disease)    Pacemaker    Paroxysmal atrial fibrillation (HCC)    Raynaud's disease    Sick sinus syndrome ([place])    Sleep apnea     Surgical History: Past Surgical History:  Procedure Laterality Date   BACK SURGERY     BUNIONECTOMY WITH WEIL OSTEOTOMY Right    CARDIAC CATHETERIZATION  10/10/2011   CHOLECYSTECTOMY     PACEMAKER INSERTION  05/04/2014   MDT Advisa dual chamber pacemaker implanted by Dr Caryl Comes for SSS, atrial fibrillation post termination pauses   PERMANENT PACEMAKER INSERTION N/A 05/04/2014   Procedure: [place];  Surgeon: Deboraha Sprang, MD;  Location: [facility] CATH LAB;  Service: Cardiovascular;  Laterality: N/A;   TONSILLECTOMY      Home Medications:  Allergies as of 04/28/2022   No Known Allergies      Medication List        Accurate as of April 28, 2022 10:43 AM. If you have any questions, ask your nurse or doctor.          calcium-vitamin D 250-100 MG-UNIT tablet Take 1 tablet by mouth 2 (two) times daily.   diltiazem 240 MG 24 hr capsule Commonly known as: CARDIZEM CD TAKE 1 CAPSULE BY MOUTH ONCE DAILY.   diltiazem 30 MG tablet Commonly known as: CARDIZEM Take 1 tablet (30 mg total) by mouth as needed.   Eliquis 5 MG Tabs tablet Generic drug: apixaban TAKE (1) TABLET TWICE DAILY.   Gemtesa 75 MG Tabs Generic drug: Vibegron Take 1 capsule by mouth daily.   pantoprazole 20 MG tablet Commonly known as: PROTONIX Take 1 tablet (20 mg  total) by mouth daily.   simvastatin 10 MG tablet Commonly known as: ZOCOR TAKE 1 TABLET DAILY        Allergies: No Known Allergies  Family History: Family History  Problem Relation Age of Onset   Hypertension Other     Social History:  reports that she quit smoking about 39 years ago. Her smoking use included cigarettes. She started smoking about 59 years ago. She has a 35.00 pack-year smoking history. She has never used smokeless tobacco. She reports that she does not drink alcohol and does not use drugs.  ROS: All other review of systems were reviewed and are negative except what is noted above in HPI  Physical Exam: BP 118/64   Pulse 97   Constitutional:  Alert and oriented, No acute distress. HEENT: [place] AT, moist mucus membranes.  Trachea midline, no masses. Cardiovascular: No clubbing, cyanosis, or edema. Respiratory: Normal respiratory effort, no increased work of breathing. GI: Abdomen is soft, nontender, nondistended, no abdominal masses GU: No CVA tenderness.  Lymph: No cervical or inguinal lymphadenopathy. Skin: No rashes, bruises or suspicious lesions. Neurologic: Grossly intact, no focal deficits, moving all 4 extremities. Psychiatric: Normal mood and affect.  Laboratory Data: Lab Results  Component Value Date   WBC 5.2 12/19/2021   HGB 12.6 12/19/2021  HCT 39.2 12/19/2021   MCV 102.9 (H) 12/19/2021   PLT 199 12/19/2021    Lab Results  Component Value Date   CREATININE 0.74 12/19/2021    No results found for: "PSA"  No results found for: "TESTOSTERONE"  Lab Results  Component Value Date   HGBA1C 5.7 (H) 05/01/2014    Urinalysis    Component Value Date/Time   COLORURINE YELLOW 05/03/2014 1632   APPEARANCEUR Clear 01/06/2022 1216   LABSPEC 1.019 05/03/2014 1632   PHURINE 5.5 05/03/2014 1632   GLUCOSEU Negative 01/06/2022 1216   HGBUR NEGATIVE 05/03/2014 1632   BILIRUBINUR Negative 01/06/2022 1216   KETONESUR NEGATIVE 05/03/2014 1632    PROTEINUR Negative 01/06/2022 1216   PROTEINUR NEGATIVE 05/03/2014 1632   UROBILINOGEN 0.2 05/03/2014 1632   NITRITE Negative 01/06/2022 1216   NITRITE NEGATIVE 05/03/2014 1632   LEUKOCYTESUR Negative 01/06/2022 1216    Lab Results  Component Value Date   LABMICR Comment 01/06/2022   WBCUA 6-10 (A) 09/19/2021   LABEPIT >10 (A) 09/19/2021   BACTERIA Few 09/19/2021    Pertinent Imaging:  No results found for this or any previous visit.  No results found for this or any previous visit.  No results found for this or any previous visit.  No results found for this or any previous visit.  No results found for this or any previous visit.  No results found for this or any previous visit.  No results found for this or any previous visit.  No results found for this or any previous visit.   Assessment & Plan:    1. OAB (overactive bladder) -Continue gemtesa '75mg'$  daily - Urinalysis, Routine w reflex microscopic  2. Urinary frequency -resolved   No follow-ups on file.  Nicolette Bang, MD  [facility] [place]

## 2022-05-01 ENCOUNTER — Ambulatory Visit (HOSPITAL_COMMUNITY)
Admission: RE | Admit: 2022-05-01 | Discharge: 2022-05-01 | Disposition: A | Discharge status: Home / Self Care | Payer: Medicare Other | Attending: Ophthalmology | Admitting: Ophthalmology

## 2022-05-01 ENCOUNTER — Encounter (HOSPITAL_COMMUNITY)
Admission: RE | Disposition: A | Discharge status: Home / Self Care | Payer: Self-pay | Source: Home / Self Care | Attending: Ophthalmology

## 2022-05-01 ENCOUNTER — Ambulatory Visit (HOSPITAL_COMMUNITY): Payer: Medicare Other | Admitting: Certified Registered"

## 2022-05-01 ENCOUNTER — Ambulatory Visit (HOSPITAL_BASED_OUTPATIENT_CLINIC_OR_DEPARTMENT_OTHER): Payer: Medicare Other | Admitting: Certified Registered"

## 2022-05-01 DIAGNOSIS — H5711 Ocular pain, right eye: Secondary | ICD-10-CM | POA: Diagnosis not present

## 2022-05-01 DIAGNOSIS — Z87891 Personal history of nicotine dependence: Secondary | ICD-10-CM | POA: Insufficient documentation

## 2022-05-01 DIAGNOSIS — G4733 Obstructive sleep apnea (adult) (pediatric): Secondary | ICD-10-CM | POA: Diagnosis not present

## 2022-05-01 DIAGNOSIS — I4891 Unspecified atrial fibrillation: Secondary | ICD-10-CM | POA: Insufficient documentation

## 2022-05-01 DIAGNOSIS — H2511 Age-related nuclear cataract, right eye: Secondary | ICD-10-CM

## 2022-05-01 DIAGNOSIS — H25811 Combined forms of age-related cataract, right eye: Secondary | ICD-10-CM | POA: Diagnosis not present

## 2022-05-01 SURGERY — CATARACT EXTRACTION PHACO AND INTRAOCULAR LENS PLACEMENT (IOC) with placement of Corticosteroid
Anesthesia: Monitor Anesthesia Care | Site: Eye | Laterality: Right

## 2022-05-01 MED ORDER — EPINEPHRINE PF 1 MG/ML IJ SOLN
INTRAMUSCULAR | Status: AC
  Filled 2022-05-01: qty 2

## 2022-05-01 MED ORDER — SODIUM HYALURONATE 10 MG/ML IO SOLUTION
PREFILLED_SYRINGE | INTRAOCULAR | Status: DC | PRN
  Administered 2022-05-01: 0.85 mL via INTRAOCULAR

## 2022-05-01 MED ORDER — TROPICAMIDE 1 % OP SOLN
1.0000 [drp] | OPHTHALMIC | Status: AC | PRN
  Administered 2022-05-01 (×3): 1 [drp] via OPHTHALMIC

## 2022-05-01 MED ORDER — MIDAZOLAM HCL 2 MG/2ML IJ SOLN
INTRAMUSCULAR | Status: AC
  Filled 2022-05-01: qty 2

## 2022-05-01 MED ORDER — PHENYLEPHRINE HCL 2.5 % OP SOLN
1.0000 [drp] | OPHTHALMIC | Status: AC | PRN
  Administered 2022-05-01 (×3): 1 [drp] via OPHTHALMIC

## 2022-05-01 MED ORDER — LIDOCAINE HCL (PF) 1 % IJ SOLN
INTRAOCULAR | Status: DC | PRN
  Administered 2022-05-01: 1 mL via OPHTHALMIC

## 2022-05-01 MED ORDER — DEXAMETHASONE 0.4 MG OP INST
VAGINAL_INSERT | OPHTHALMIC | Status: DC | PRN
  Administered 2022-05-01: 0.4 mg via OPHTHALMIC

## 2022-05-01 MED ORDER — CHLORHEXIDINE GLUCONATE 0.12 % MT SOLN: 15.0000 mL | Freq: Once | OROMUCOSAL | Status: DC

## 2022-05-01 MED ORDER — TETRACAINE HCL 0.5 % OP SOLN
1.0000 [drp] | OPHTHALMIC | Status: AC | PRN
  Administered 2022-05-01 (×3): 1 [drp] via OPHTHALMIC

## 2022-05-01 MED ORDER — EPINEPHRINE PF 1 MG/ML IJ SOLN
INTRAOCULAR | Status: DC | PRN
  Administered 2022-05-01: 500 mL

## 2022-05-01 MED ORDER — BSS IO SOLN
INTRAOCULAR | Status: DC | PRN
  Administered 2022-05-01: 15 mL via INTRAOCULAR

## 2022-05-01 MED ORDER — POVIDONE-IODINE 5 % OP SOLN
OPHTHALMIC | Status: DC | PRN
  Administered 2022-05-01: 1 via OPHTHALMIC

## 2022-05-01 MED ORDER — SODIUM CHLORIDE 0.9% FLUSH
INTRAVENOUS | Status: DC | PRN
  Administered 2022-05-01: 5 mL via INTRAVENOUS

## 2022-05-01 MED ORDER — DEXAMETHASONE 0.4 MG OP INST
VAGINAL_INSERT | OPHTHALMIC | Status: AC
  Filled 2022-05-01: qty 1

## 2022-05-01 MED ORDER — ORAL CARE MOUTH RINSE: 15.0000 mL | Freq: Once | OROMUCOSAL | Status: DC

## 2022-05-01 MED ORDER — STERILE WATER FOR IRRIGATION IR SOLN
Status: DC | PRN
  Administered 2022-05-01: 250 mL

## 2022-05-01 MED ORDER — MIDAZOLAM HCL 2 MG/2ML IJ SOLN
INTRAMUSCULAR | Status: DC | PRN
  Administered 2022-05-01: 2 mg via INTRAVENOUS

## 2022-05-01 MED ORDER — LIDOCAINE HCL 3.5 % OP GEL
1.0000 | Freq: Once | OPHTHALMIC | Status: AC
  Administered 2022-05-01: 1 via OPHTHALMIC

## 2022-05-01 MED ORDER — SODIUM HYALURONATE 23MG/ML IO SOSY
PREFILLED_SYRINGE | INTRAOCULAR | Status: DC | PRN
  Administered 2022-05-01: 0.6 mL via INTRAOCULAR

## 2022-05-01 SURGICAL SUPPLY — 14 items
CATARACT SUITE SIGHTPATH (MISCELLANEOUS) ×2 IMPLANT
CLOTH BEACON ORANGE TIMEOUT ST (SAFETY) ×2 IMPLANT
EYE SHIELD UNIVERSAL CLEAR (GAUZE/BANDAGES/DRESSINGS) ×1 IMPLANT
FEE CATARACT SUITE SIGHTPATH (MISCELLANEOUS) ×1 IMPLANT
GLOVE BIOGEL PI IND STRL 7.0 (GLOVE) ×2 IMPLANT
GLOVE BIOGEL PI INDICATOR 7.0 (GLOVE) ×1
GLOVE ECLIPSE 6.5 STRL STRAW (GLOVE) ×1 IMPLANT
LENS IOL RAYNER 20.5 (Intraocular Lens) ×2 IMPLANT
LENS IOL RAYONE EMV 20.5 (Intraocular Lens) IMPLANT
PAD ARMBOARD 7.5X6 YLW CONV (MISCELLANEOUS) ×2 IMPLANT
SYR TB 1ML LL NO SAFETY (SYRINGE) ×2 IMPLANT
TAPE SURG TRANSPORE 1 IN (GAUZE/BANDAGES/DRESSINGS) IMPLANT
TAPE SURGICAL TRANSPORE 1 IN (GAUZE/BANDAGES/DRESSINGS) ×1
WATER STERILE IRR 250ML POUR (IV SOLUTION) ×2 IMPLANT

## 2022-05-01 NOTE — Op Note (Signed)
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 Visually significant nuclear age-related cataract, Right Eye (H25.811) 2. Pain and inflammation following cataract surgery Right Eye (H57.11)  Procedure:  Removal of cataract via phacoemulsification and insertion of intra-ocular lens Rayner [id number] +20.5D into the capsular bag of the Right Eye 2. Placement of Dextenza insert, Right Eye  Attending surgeon: Gerda Diss. Udell Blasingame, MD, MA  Anesthesia: MAC, Topical [place]  Complications: None  Estimated Blood Loss: <72m (minimal)  Specimens: None  Implants: As above  Indications:  Visually significant age-related cataract, Right Eye  Procedure:  The patient was seen and identified in the pre-operative area. The operative eye was identified and dilated.  The operative eye was marked.  Topical anesthesia was administered to the operative eye.     The patient was then to the operative suite and placed in the supine position.  A timeout was performed confirming the patient, procedure to be performed, and all other relevant information.   The patient's face was prepped and draped in the usual fashion for intra-ocular surgery.  A lid speculum was placed into the operative eye and the surgical microscope moved into place and focused.  A superotemporal paracentesis was created using a 20 gauge paracentesis blade.  Shugarcaine was injected into the anterior chamber.  Viscoelastic was injected into the anterior chamber.  A temporal clear-corneal main wound incision was created using a 2.467mmicrokeratome.  A continuous curvilinear capsulorrhexis was initiated using an irrigating cystitome and completed using capsulorrhexis forceps.  Hydrodissection and hydrodeliniation were performed.  Viscoelastic was injected into the anterior chamber.  A phacoemulsification handpiece and a chopper as a second  instrument were used to remove the nucleus and epinucleus. The irrigation/aspiration handpiece was used to remove any remaining cortical material.   The capsular bag was reinflated with viscoelastic, checked, and found to be intact.  The intraocular lens was inserted into the capsular bag.  The irrigation/aspiration handpiece was used to remove any remaining viscoelastic.  The clear corneal wound and paracentesis wounds were then hydrated and checked with Weck-Cels to be watertight.    The lid-speculum was removed. The lower punctum was dilated. A Dextenza implant was placed in the lower canaliculus without complication.  The drape was removed.  The patient's face was cleaned with a wet and dry 4x4. A clear shield was taped over the eye. The patient was taken to the post-operative care unit in good condition, having tolerated the procedure well.  Post-Op Instructions: [facility] and will receive all other orders and instructions.

## 2022-05-01 NOTE — Interval H&P Note (Signed)
History and Physical Interval Note:  05/01/2022 9:44 AM  Tiffany Melton  has presented today for surgery, with the diagnosis of nuclear sclerosis age related cataract; right ocular pain and inflammation; right.  The various methods of treatment have been discussed with the patient and family. After consideration of risks, benefits and other options for treatment, the patient has consented to  Procedure(s) with comments: CATARACT EXTRACTION PHACO AND INTRAOCULAR LENS PLACEMENT ([place]) with placement of Corticosteroid (Right) - CDE:  as a surgical intervention.  The patient's history has been reviewed, patient examined, no change in status, stable for surgery.  I have reviewed the patient's chart and labs.  Questions were answered to the patient's satisfaction.     Baruch Goldmann

## 2022-05-01 NOTE — Discharge Instructions (Addendum)
Please discharge patient when stable, will follow up today with Dr. Marisa Hua at the [facility] office immediately following discharge.  Leave shield in place until visit.  All paperwork with discharge instructions will be given at the office.  [facility] Address:  [address]  [place], [place] [address]    10:40am

## 2022-05-01 NOTE — Anesthesia Preprocedure Evaluation (Signed)
Anesthesia Evaluation  Patient identified by MRN, date of birth, ID band Patient awake    Reviewed: Allergy & Precautions, NPO status , Patient's Chart, lab work & pertinent test results  Airway Mallampati: II  TM Distance: >3 FB Neck ROM: Full    Dental no notable dental hx.    Pulmonary neg pulmonary ROS, sleep apnea and Continuous Positive Airway Pressure Ventilation , former smoker,    Pulmonary exam normal        Cardiovascular Exercise Tolerance: Good negative cardio ROS  + dysrhythmias Atrial Fibrillation + pacemaker  Rhythm:Irregular  ECHO EF 65%   Neuro/Psych negative neurological ROS  negative psych ROS   GI/Hepatic negative GI ROS, Neg liver ROS, GERD  ,  Endo/Other  negative endocrine ROS  Renal/GU negative Renal ROS  negative genitourinary   Musculoskeletal negative musculoskeletal ROS (+)   Abdominal Normal abdominal exam  (+)   Peds negative pediatric ROS (+)  Hematology negative hematology ROS (+)   Anesthesia Other Findings   Reproductive/Obstetrics negative OB ROS                             Anesthesia Physical  Anesthesia Plan  ASA: 3  Anesthesia Plan: MAC   Post-op Pain Management:    Induction:   PONV Risk Score and Plan:   Airway Management Planned: Nasal Cannula  Additional Equipment:   Intra-op Plan:   Post-operative Plan:   Informed Consent: I have reviewed the patients History and Physical, chart, labs and discussed the procedure including the risks, benefits and alternatives for the proposed anesthesia with the patient or authorized representative who has indicated his/her understanding and acceptance.       Plan Discussed with: CRNA  Anesthesia Plan Comments:         Anesthesia Quick Evaluation

## 2022-05-01 NOTE — Transfer of Care (Signed)
Immediate Anesthesia Transfer of Care Note  Patient: Tiffany Melton  Procedure(s) Performed: CATARACT EXTRACTION PHACO AND INTRAOCULAR LENS PLACEMENT ([facility]) with placement of Corticosteroid (Right: Eye)  Patient Location: Short Stay  Anesthesia Type: Mac Level of Consciousness: awake, alert  and oriented  Airway & Oxygen Therapy: Patient Spontanous Breathing  Post-op Assessment: Report given to RN and Post -op Vital signs reviewed and stable  Post vital signs: Reviewed and stable  Last Vitals:  Vitals Value Taken Time  BP    Temp    Pulse    Resp    SpO2      Last Pain:  Vitals:   05/01/22 0900  PainSc: 0-No pain         Complications: No notable events documented.

## 2022-05-01 NOTE — Anesthesia Procedure Notes (Signed)
Procedure Name: MAC Date/Time: 05/01/2022 9:51 AM  Performed by: Orlie Dakin, CRNAPre-anesthesia Checklist: Patient identified, Emergency Drugs available, Suction available and Patient being monitored Patient Re-evaluated:Patient Re-evaluated prior to induction Oxygen Delivery Method: Nasal cannula Placement Confirmation: positive ETCO2

## 2022-05-02 NOTE — Anesthesia Postprocedure Evaluation (Signed)
Anesthesia Post Note  Patient: Tiffany Melton  Procedure(s) Performed: CATARACT EXTRACTION PHACO AND INTRAOCULAR LENS PLACEMENT ([facility]) with placement of Corticosteroid (Right: Eye)  Patient location during evaluation: Phase II Anesthesia Type: MAC Level of consciousness: awake Pain management: pain level controlled Vital Signs Assessment: post-procedure vital signs reviewed and stable Respiratory status: spontaneous breathing and respiratory function stable Cardiovascular status: blood pressure returned to baseline and stable Postop Assessment: no headache and no apparent nausea or vomiting Anesthetic complications: no Comments: Late entry   No notable events documented.   Last Vitals:  Vitals:   05/01/22 0915 05/01/22 1008  BP: 134/68 129/68  Pulse: (!) 59 60  Resp: 17 18  Temp:  37 C  SpO2: 100% 100%    Last Pain:  Vitals:   05/01/22 1008  TempSrc: Oral  PainSc: 0-No pain                 Louann Sjogren

## 2022-05-03 NOTE — Progress Notes (Signed)
Remote pacemaker transmission.   

## 2022-06-20 ENCOUNTER — Other Ambulatory Visit: Payer: Self-pay | Admitting: Cardiology

## 2022-07-04 ENCOUNTER — Other Ambulatory Visit: Payer: Self-pay | Admitting: Cardiology

## 2022-07-05 ENCOUNTER — Other Ambulatory Visit: Payer: Self-pay | Admitting: Cardiology

## 2022-07-05 NOTE — Telephone Encounter (Signed)
Prescription refill request for Eliquis received. Indication: PAF Last office visit: 02/23/22  Myles Gip MD Scr: 0.74 on 12/19/21 Age: 71 Weight: 86.9kg  Based on above findings Eliquis '5mg'$  twice daily is the appropriate dose.  Refill approved.

## 2022-07-11 ENCOUNTER — Ambulatory Visit (INDEPENDENT_AMBULATORY_CARE_PROVIDER_SITE_OTHER): Payer: Medicare Other

## 2022-07-11 DIAGNOSIS — I495 Sick sinus syndrome: Secondary | ICD-10-CM

## 2022-07-12 LAB — CUP PACEART REMOTE DEVICE CHECK
Battery Remaining Longevity: 26 mo
Battery Voltage: 2.93 V
Brady Statistic AP VP Percent: 0.98 %
Brady Statistic AP VS Percent: 83.16 %
Brady Statistic AS VP Percent: 0.48 %
Brady Statistic AS VS Percent: 15.38 %
Brady Statistic RA Percent Paced: 79.58 %
Brady Statistic RV Percent Paced: 1.5 %
Date Time Interrogation Session: 20231003143957
Implantable Lead Implant Date: 20150727
Implantable Lead Implant Date: 20150727
Implantable Lead Location: 753859
Implantable Lead Location: 753860
Implantable Lead Model: 5076
Implantable Lead Model: 5076
Implantable Pulse Generator Implant Date: 20150727
Lead Channel Impedance Value: 418 Ohm
Lead Channel Impedance Value: 494 Ohm
Lead Channel Impedance Value: 532 Ohm
Lead Channel Impedance Value: 551 Ohm
Lead Channel Pacing Threshold Amplitude: 0.625 V
Lead Channel Pacing Threshold Amplitude: 1.625 V
Lead Channel Pacing Threshold Pulse Width: 0.4 ms
Lead Channel Pacing Threshold Pulse Width: 0.4 ms
Lead Channel Sensing Intrinsic Amplitude: 0.25 mV
Lead Channel Sensing Intrinsic Amplitude: 0.25 mV
Lead Channel Sensing Intrinsic Amplitude: 3.375 mV
Lead Channel Sensing Intrinsic Amplitude: 3.375 mV
Lead Channel Setting Pacing Amplitude: 2 V
Lead Channel Setting Pacing Amplitude: 3.5 V
Lead Channel Setting Pacing Pulse Width: 1 ms
Lead Channel Setting Sensing Sensitivity: 2 mV

## 2022-07-24 NOTE — Progress Notes (Signed)
Remote pacemaker transmission.   

## 2022-08-07 DIAGNOSIS — Z23 Encounter for immunization: Secondary | ICD-10-CM | POA: Diagnosis not present

## 2022-08-07 DIAGNOSIS — S82002A Unspecified fracture of left patella, initial encounter for closed fracture: Secondary | ICD-10-CM | POA: Diagnosis not present

## 2022-08-07 DIAGNOSIS — Z6828 Body mass index (BMI) 28.0-28.9, adult: Secondary | ICD-10-CM | POA: Diagnosis not present

## 2022-08-07 DIAGNOSIS — R030 Elevated blood-pressure reading, without diagnosis of hypertension: Secondary | ICD-10-CM | POA: Diagnosis not present

## 2022-08-23 DIAGNOSIS — S82002A Unspecified fracture of left patella, initial encounter for closed fracture: Secondary | ICD-10-CM | POA: Diagnosis not present

## 2022-08-23 DIAGNOSIS — R030 Elevated blood-pressure reading, without diagnosis of hypertension: Secondary | ICD-10-CM | POA: Diagnosis not present

## 2022-08-23 DIAGNOSIS — Z6829 Body mass index (BMI) 29.0-29.9, adult: Secondary | ICD-10-CM | POA: Diagnosis not present

## 2022-09-06 DIAGNOSIS — R030 Elevated blood-pressure reading, without diagnosis of hypertension: Secondary | ICD-10-CM | POA: Diagnosis not present

## 2022-09-06 DIAGNOSIS — S82002A Unspecified fracture of left patella, initial encounter for closed fracture: Secondary | ICD-10-CM | POA: Diagnosis not present

## 2022-09-06 DIAGNOSIS — Z6829 Body mass index (BMI) 29.0-29.9, adult: Secondary | ICD-10-CM | POA: Diagnosis not present

## 2022-09-07 ENCOUNTER — Other Ambulatory Visit (HOSPITAL_COMMUNITY): Payer: Self-pay | Admitting: Family Medicine

## 2022-09-07 DIAGNOSIS — S82002A Unspecified fracture of left patella, initial encounter for closed fracture: Secondary | ICD-10-CM

## 2022-09-07 DIAGNOSIS — H26492 Other secondary cataract, left eye: Secondary | ICD-10-CM | POA: Diagnosis not present

## 2022-09-21 DIAGNOSIS — H26491 Other secondary cataract, right eye: Secondary | ICD-10-CM | POA: Diagnosis not present

## 2022-09-27 ENCOUNTER — Emergency Department (HOSPITAL_COMMUNITY): Payer: Medicare Other

## 2022-09-27 ENCOUNTER — Encounter (HOSPITAL_COMMUNITY): Payer: Self-pay | Admitting: Emergency Medicine

## 2022-09-27 ENCOUNTER — Emergency Department (HOSPITAL_COMMUNITY)
Admission: EM | Admit: 2022-09-27 | Discharge: 2022-09-27 | Disposition: A | Discharge status: Home / Self Care | Payer: Medicare Other | Attending: Emergency Medicine | Admitting: Emergency Medicine

## 2022-09-27 DIAGNOSIS — Z950 Presence of cardiac pacemaker: Secondary | ICD-10-CM | POA: Diagnosis not present

## 2022-09-27 DIAGNOSIS — S32030A Wedge compression fracture of third lumbar vertebra, initial encounter for closed fracture: Secondary | ICD-10-CM | POA: Diagnosis not present

## 2022-09-27 DIAGNOSIS — M5137 Other intervertebral disc degeneration, lumbosacral region: Secondary | ICD-10-CM | POA: Diagnosis not present

## 2022-09-27 DIAGNOSIS — M5451 Vertebrogenic low back pain: Secondary | ICD-10-CM | POA: Diagnosis present

## 2022-09-27 DIAGNOSIS — X501XXA Overexertion from prolonged static or awkward postures, initial encounter: Secondary | ICD-10-CM | POA: Diagnosis not present

## 2022-09-27 DIAGNOSIS — Z7901 Long term (current) use of anticoagulants: Secondary | ICD-10-CM | POA: Insufficient documentation

## 2022-09-27 DIAGNOSIS — M5136 Other intervertebral disc degeneration, lumbar region: Secondary | ICD-10-CM | POA: Diagnosis not present

## 2022-09-27 MED ORDER — LIDOCAINE 5 % EX PTCH
1.0000 | MEDICATED_PATCH | CUTANEOUS | Status: DC
  Administered 2022-09-27: 1 via TRANSDERMAL
  Filled 2022-09-27: qty 1

## 2022-09-27 MED ORDER — OXYCODONE HCL 5 MG PO TABS
5.0000 mg | ORAL_TABLET | Freq: Once | ORAL | Status: AC
  Administered 2022-09-27: 5 mg via ORAL
  Filled 2022-09-27: qty 1

## 2022-09-27 MED ORDER — OXYCODONE-ACETAMINOPHEN 5-325 MG PO TABS: 1.0000 | ORAL_TABLET | Freq: Four times a day (QID) | ORAL | 0 refills | Status: DC | PRN

## 2022-09-27 MED ORDER — LIDOCAINE 5 % EX PTCH: 1.0000 | MEDICATED_PATCH | CUTANEOUS | 0 refills | Status: DC

## 2022-09-27 NOTE — ED Provider Triage Note (Signed)
Emergency Medicine Provider Triage Evaluation Note  Tiffany Melton , a 71 y.o. female  was evaluated in triage.  Pt complains of lower back pain.  States on Monday she was putting some water into the fireplace and felt a pop in her lower back.  Since then she has had severe pain in the right parasternal lumbar region of her back extending to the midline.  Denies saddle anesthesia, urinary and bowel incontinence.  Denies hematuria painful urination.  Review of Systems  Positive: See above Negative: See above  Physical Exam  BP (!) 151/86 (BP Location: Right Arm)   Pulse 71   Temp 97.7 F (36.5 C)   Resp 15   Ht '5\' 9"'$  (1.753 m)   Wt 81 kg   SpO2 98%   BMI 26.37 kg/m  Gen:   Awake, no distress   Resp:  Normal effort  MSK:   Moves extremities without difficulty  Other:    Medical Decision Making  Medically screening exam initiated at 9:21 AM.  Appropriate orders placed.  Tiffany Melton was informed that the remainder of the evaluation will be completed by another provider, this initial triage assessment does not replace that evaluation, and the importance of remaining in the ED until their evaluation is complete.  Work up started   Harriet Pho, [place] 09/27/22 [phone]

## 2022-09-27 NOTE — ED Provider Notes (Signed)
[facility] EMERGENCY DEPARTMENT Provider Note   CSN: [id number] Arrival date & time: 09/27/22  7096     History  Chief Complaint  Patient presents with   Back Pain    Tiffany Melton is a 71 y.o. female.   Back Pain    Patient has history of Raynaud's disease, paroxysmal A-fib, GERD, status post pacemaker, and remote history of prior herniated disc and back surgery.  Patient presents to the ED with complaints of acute back pain.  Patient states on Monday she was bending over when she felt a pop in her lower back.  Since that time she has had some persistent back pain that waxes and wanes in severity.  At times it is very severe.  It hurts for her to turn or move certain positions.  She denies any trouble with any urinary or bladder incontinence.  She is not having any pain radiating down her leg.  She denies any numbness or weakness.  Home Medications Prior to Admission medications   Medication Sig Start Date End Date Taking? Authorizing Provider  lidocaine (LIDODERM) 5 % Place 1 patch onto the skin daily. Remove & Discard patch within 12 hours or as directed by MD 09/27/22  Yes Dorie Rank, MD  oxyCODONE-acetaminophen (PERCOCET/ROXICET) 5-325 MG tablet Take 1 tablet by mouth every 6 (six) hours as needed for severe pain. 09/27/22  Yes Dorie Rank, MD  apixaban (ELIQUIS) 5 MG TABS tablet TAKE (1) TABLET TWICE DAILY. 07/05/22   Satira Sark, MD  calcium-vitamin D 250-100 MG-UNIT tablet Take 1 tablet by mouth 2 (two) times daily.    [provider]  diltiazem (CARDIZEM CD) 240 MG 24 hr capsule TAKE 1 CAPSULE BY MOUTH ONCE DAILY. 06/20/22   Satira Sark, MD  diltiazem (CARDIZEM) 30 MG tablet Take 1 tablet (30 mg total) by mouth as needed. 12/12/19   Allred, Jeneen Rinks, MD  pantoprazole (PROTONIX) 20 MG tablet Take 1 tablet (20 mg total) by mouth daily. 02/23/22   Satira Sark, MD  simvastatin (ZOCOR) 10 MG tablet TAKE 1 TABLET DAILY 10/28/20   Satira Sark, MD  Vibegron [facility]) 75 MG TABS Take 1 capsule by mouth daily. 04/28/22   McKenzie, Candee Furbish, MD      Allergies    Patient has no known allergies.    Review of Systems   Review of Systems  Musculoskeletal:  Positive for back pain.    Physical Exam Updated Vital Signs BP (!) 151/86 (BP Location: Right Arm)   Pulse 71   Temp 97.7 F (36.5 C)   Resp 15   Ht 1.753 m ('5\' 9"'$ )   Wt 81 kg   SpO2 98%   BMI 26.37 kg/m  Physical Exam Vitals and nursing note reviewed.  Constitutional:      General: She is not in acute distress.    Appearance: She is well-developed.  HENT:     Head: Normocephalic and atraumatic.     Right Ear: External ear normal.     Left Ear: External ear normal.  Eyes:     General: No scleral icterus.       Right eye: No discharge.        Left eye: No discharge.     Conjunctiva/sclera: Conjunctivae normal.  Neck:     Trachea: No tracheal deviation.  Cardiovascular:     Rate and Rhythm: Normal rate.  Pulmonary:     Effort: Pulmonary effort is normal.  Abdominal:  General: There is no distension.     Palpations: Abdomen is soft.     Tenderness: There is no abdominal tenderness. There is no guarding or rebound.  Musculoskeletal:        General: No deformity.     Cervical back: Neck supple.     Thoracic back: Normal.     Lumbar back: Tenderness and bony tenderness present.  Skin:    General: Skin is warm and dry.     Findings: No rash.  Neurological:     General: No focal deficit present.     Mental Status: She is alert.     Cranial Nerves: No cranial nerve deficit, dysarthria or facial asymmetry.     Sensory: No sensory deficit.     Motor: No abnormal muscle tone or seizure activity.     Coordination: Coordination normal.     Comments: Normal strength and sensation bilateral lower extremities  Psychiatric:        Mood and Affect: Mood normal.     ED Results / Procedures / Treatments   Labs (all labs ordered are listed, but only  abnormal results are displayed) Labs Reviewed - No data to display  EKG None  Radiology DG Lumbar Spine Complete  Result Date: 09/27/2022 CLINICAL DATA:  Low back pain after possible injury last week. EXAM: LUMBAR SPINE - COMPLETE 4+ VIEW COMPARISON:  None Available. FINDINGS: Mild superior endplate deformity of L3 vertebral body is noted concerning for fracture of indeterminate age. No spondylolisthesis is noted. Severe degenerative disc disease is noted at L4-5. Mild degenerative disc disease is noted at L5-S1. IMPRESSION: Mild superior endplate deformity of L3 vertebral body is noted concerning for fracture of indeterminate age. CT or MRI may be performed for further evaluation. Electronically Signed   By: Marijo Conception M.D.   On: 09/27/2022 10:24    Procedures Procedures    Medications Ordered in ED Medications  lidocaine (LIDODERM) 5 % 1 patch (1 patch Transdermal Patch Applied 09/27/22 0929)  oxyCODONE (Oxy IR/ROXICODONE) immediate release tablet 5 mg (5 mg Oral Given 09/27/22 0929)  oxyCODONE (Oxy IR/ROXICODONE) immediate release tablet 5 mg (5 mg Oral Given 09/27/22 1431)    ED Course/ Medical Decision Making/ A&P Clinical Course as of 09/27/22 1615  Wed Sep 27, 2022  1030 Plain x-ray shows a mild deformity at L3.  Could correlate with a compression fracture [JK]  1539 Notified that pt cannot get the MRI today.  They are down one scanner.  SHe will have get it tomorrow am. [JK]    Clinical Course User Index [JK] Dorie Rank, MD                           Medical Decision Making Problems Addressed: Compression fracture of L3 lumbar vertebra, closed, initial encounter [facility]): acute illness or injury that poses a threat to life or bodily functions  Amount and/or Complexity of Data Reviewed Radiology: ordered and independent interpretation performed.  Risk Prescription drug management.   Patient presented to ED for evaluation of lower back pain.  No complaints of  weakness or numbness in her lower extremity.  Plain film suggested possible compression fracture of L3.  Further evaluation with CT or MRI recommended.  With her prior history of back surgeries I ordered an MRI of her lumbar spine initially.  Patient's pacemaker is pacemaker safe.  Patient held in the ED for several hours, eventually MRI notified us that we were  not going to be able to get the MRI performed acutely today and the patient would have to hold in the ED overnight until 10 AM in order to get the study.  Patient is not having any acute neurologic deficits at this time.  I will discharge her home with pain medications and order an outpatient MRI as well as outpatient neurosurgery follow-up.        Final Clinical Impression(s) / ED Diagnoses Final diagnoses:  Compression fracture of L3 lumbar vertebra, closed, initial encounter [facility])    Rx / DC Orders ED Discharge Orders          Ordered    MR LUMBAR SPINE WO CONTRAST        09/27/22 1557    oxyCODONE-acetaminophen (PERCOCET/ROXICET) 5-325 MG tablet  Every 6 hours PRN        09/27/22 1557    lidocaine (LIDODERM) 5 %  Every 24 hours        09/27/22 1557              Dorie Rank, MD 09/27/22 1615

## 2022-09-27 NOTE — Discharge Instructions (Addendum)
Take the medications as needed for pain.  Follow-up with the MRI as we discussed.  They should contact you with the specific time.  Follow up with DR Kathyrn Sheriff for further evaluation

## 2022-09-27 NOTE — ED Triage Notes (Signed)
Pt reports bending over Monday and felt a pop in her back that almost made her fall. Lower back pain with no radiation. Previous back surgeries in 70s.Ambulatory to triage.

## 2022-09-27 NOTE — ED Notes (Addendum)
Visitor x2 at [employer]. EDP at [facility]. Reports R lower back pain with some new radiation into R buttocks, but not up back or down leg. No red flag s/sx. H/o 2 back surgeries. Pending CT.

## 2022-09-28 ENCOUNTER — Telehealth (HOSPITAL_COMMUNITY): Payer: Self-pay | Admitting: Emergency Medicine

## 2022-09-28 NOTE — Telephone Encounter (Signed)
Patient called asking for MRI order.  It seems on the note from yesterday that Dr. Tomi Bamberger had ordered an MRI but then it was canceled.  I will reorder the outpatient MRI and she will need to follow-up with neurosurgery and PCP.

## 2022-10-05 DIAGNOSIS — S32030A Wedge compression fracture of third lumbar vertebra, initial encounter for closed fracture: Secondary | ICD-10-CM | POA: Diagnosis not present

## 2022-10-05 DIAGNOSIS — M5416 Radiculopathy, lumbar region: Secondary | ICD-10-CM | POA: Diagnosis not present

## 2022-10-10 ENCOUNTER — Ambulatory Visit (INDEPENDENT_AMBULATORY_CARE_PROVIDER_SITE_OTHER): Payer: Medicare Other

## 2022-10-10 DIAGNOSIS — I495 Sick sinus syndrome: Secondary | ICD-10-CM

## 2022-10-11 LAB — CUP PACEART REMOTE DEVICE CHECK
Battery Remaining Longevity: 24 mo
Battery Voltage: 2.92 V
Brady Statistic AP VP Percent: 2.07 %
Brady Statistic AP VS Percent: 79.08 %
Brady Statistic AS VP Percent: 0.46 %
Brady Statistic AS VS Percent: 18.39 %
Brady Statistic RA Percent Paced: 74.87 %
Brady Statistic RV Percent Paced: 2.44 %
Date Time Interrogation Session: 20240103120107
Implantable Lead Connection Status: 753985
Implantable Lead Connection Status: 753985
Implantable Lead Implant Date: 20150727
Implantable Lead Implant Date: 20150727
Implantable Lead Location: 753859
Implantable Lead Location: 753860
Implantable Lead Model: 5076
Implantable Lead Model: 5076
Implantable Pulse Generator Implant Date: 20150727
Lead Channel Impedance Value: 380 Ohm
Lead Channel Impedance Value: 475 Ohm
Lead Channel Impedance Value: 513 Ohm
Lead Channel Impedance Value: 532 Ohm
Lead Channel Pacing Threshold Amplitude: 0.75 V
Lead Channel Pacing Threshold Amplitude: 2.5 V
Lead Channel Pacing Threshold Pulse Width: 0.4 ms
Lead Channel Pacing Threshold Pulse Width: 0.4 ms
Lead Channel Sensing Intrinsic Amplitude: 3 mV
Lead Channel Sensing Intrinsic Amplitude: 3 mV
Lead Channel Sensing Intrinsic Amplitude: 4.875 mV
Lead Channel Sensing Intrinsic Amplitude: 4.875 mV
Lead Channel Setting Pacing Amplitude: 2 V
Lead Channel Setting Pacing Amplitude: 3.5 V
Lead Channel Setting Pacing Pulse Width: 1 ms
Lead Channel Setting Sensing Sensitivity: 2 mV
Zone Setting Status: 755011
Zone Setting Status: 755011

## 2022-10-12 DIAGNOSIS — J0101 Acute recurrent maxillary sinusitis: Secondary | ICD-10-CM | POA: Diagnosis not present

## 2022-10-12 DIAGNOSIS — Z6828 Body mass index (BMI) 28.0-28.9, adult: Secondary | ICD-10-CM | POA: Diagnosis not present

## 2022-10-12 DIAGNOSIS — R059 Cough, unspecified: Secondary | ICD-10-CM | POA: Diagnosis not present

## 2022-10-12 DIAGNOSIS — G4700 Insomnia, unspecified: Secondary | ICD-10-CM | POA: Diagnosis not present

## 2022-10-12 DIAGNOSIS — R030 Elevated blood-pressure reading, without diagnosis of hypertension: Secondary | ICD-10-CM | POA: Diagnosis not present

## 2022-10-13 ENCOUNTER — Ambulatory Visit (HOSPITAL_COMMUNITY): Payer: Medicare Other

## 2022-10-18 ENCOUNTER — Ambulatory Visit (HOSPITAL_COMMUNITY): Payer: Medicare Other

## 2022-10-18 ENCOUNTER — Encounter (HOSPITAL_COMMUNITY): Payer: Self-pay

## 2022-10-18 IMAGING — MR MR HIP*R* W/O CM
6 series · 36 of 40 positions shown · non-contrast
Comparison: Pelvis and right hip radiographs 08/22/2021

CLINICAL DATA: Right hip pain.

EXAM:
MR OF THE RIGHT HIP WITHOUT CONTRAST
TECHNIQUE: Multiplanar, multisequence MR imaging was performed. No intravenous
contrast was administered.

[Series 4: T1 · coronal · 4.0mm · 0.90mm/px · 4 of 37 slices shown]
[im 1/37]
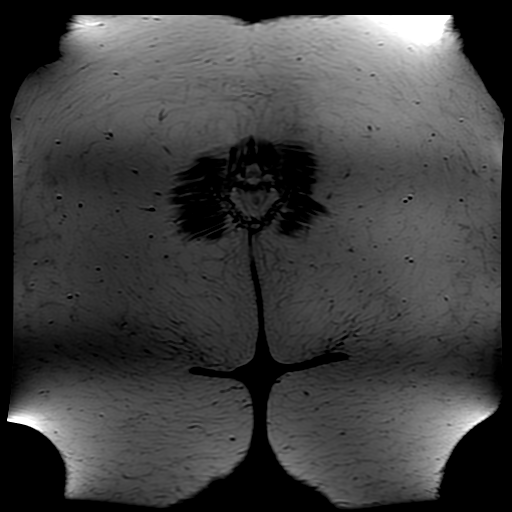
[im 7/37]
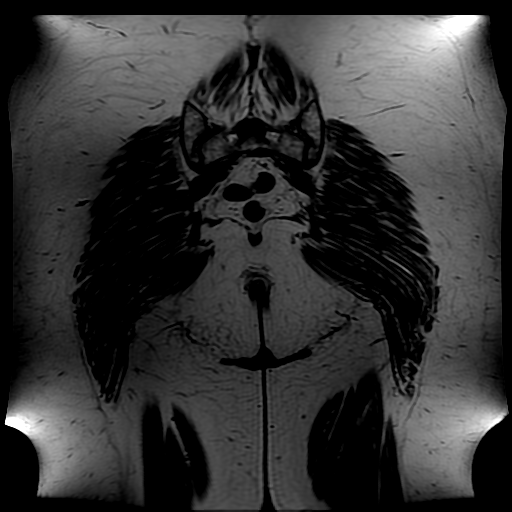
[im 13/37]
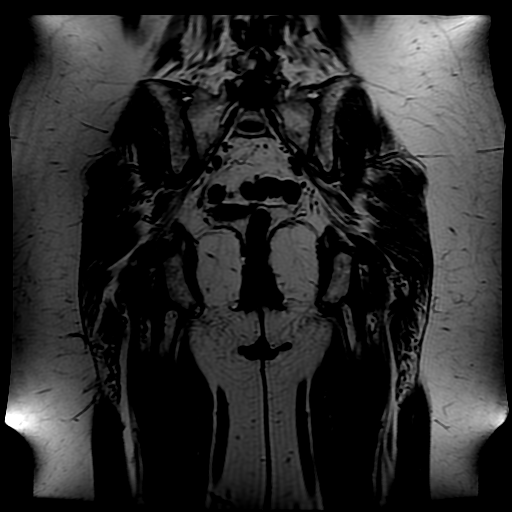
[im 19/37]
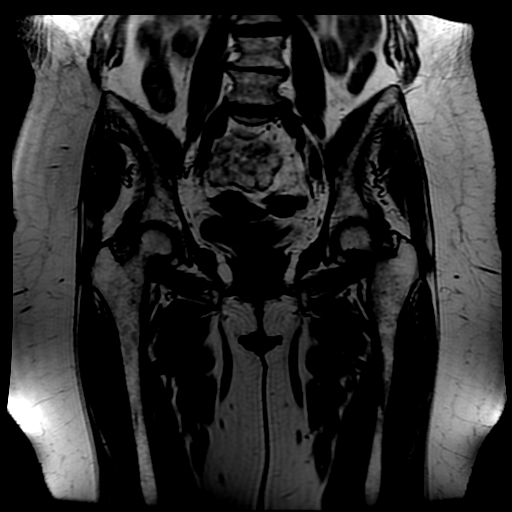

[Series 5: T2 fat-sat · coronal · 4.0mm · 0.90mm/px · 7 of 37 slices shown (1 of 2)]
[im 1/37]
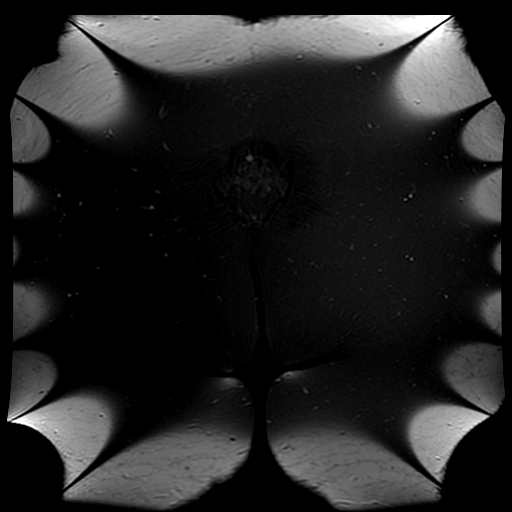
[im 7/37]
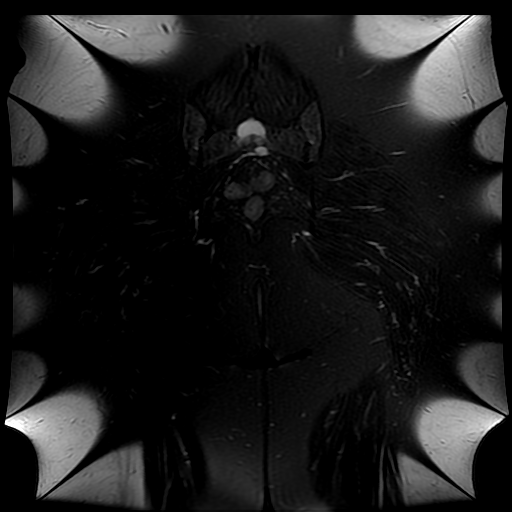
[im 13/37]
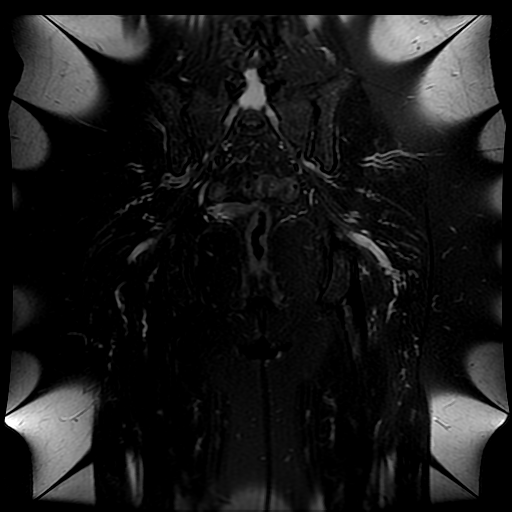
[im 19/37]
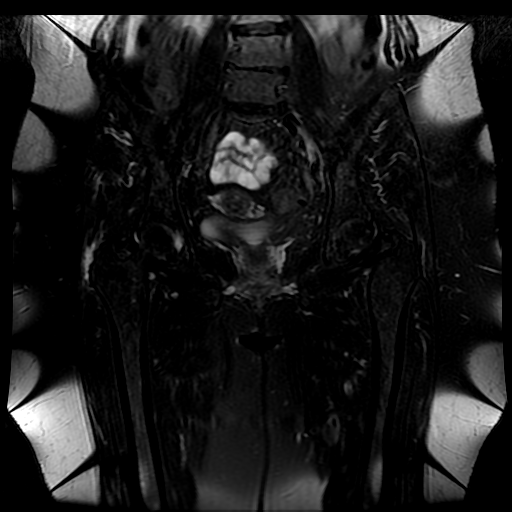
[im 25/37]
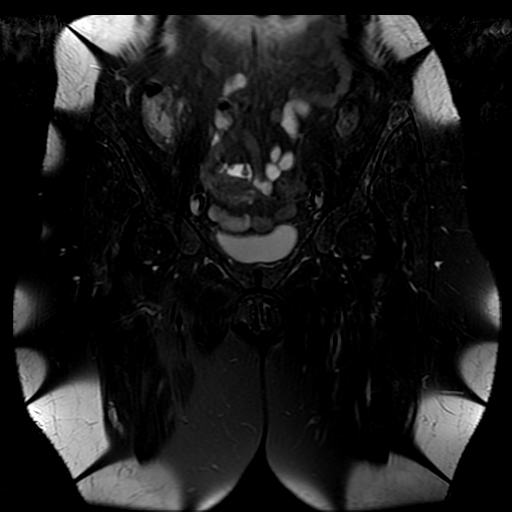
[im 31/37]
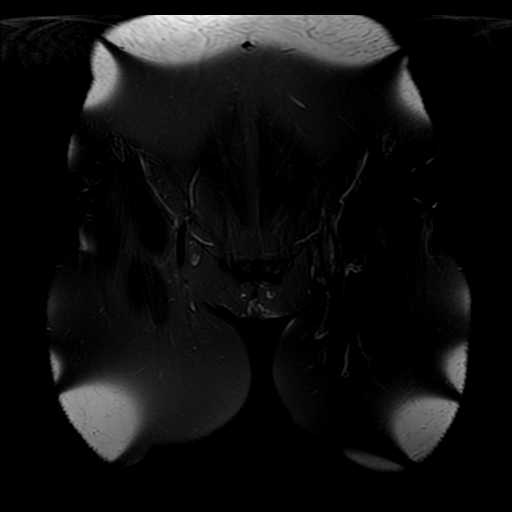
[im 37/37]
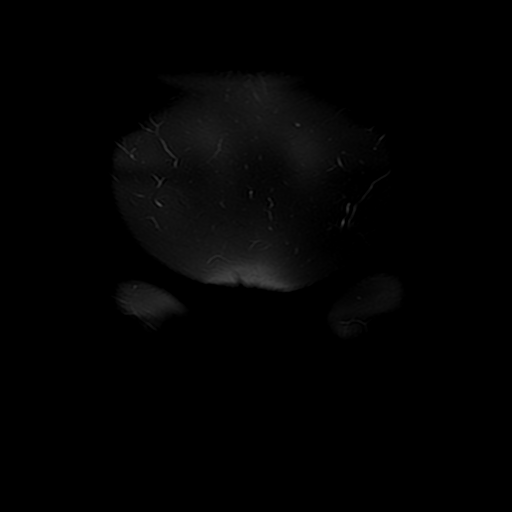

[Series 6: T2 fat-sat · axial · 4.0mm · 1.80mm/px · z∈[-120,+100]mm · 8 of 45 slices shown (2 of 2)]
[im 1/45]
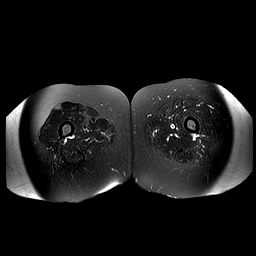
[im 6/45]
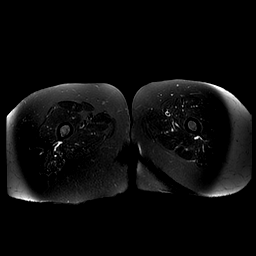
[im 12/45]
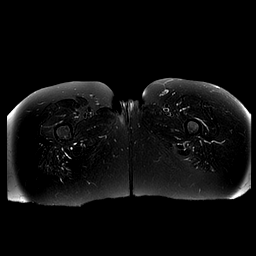
[im 17/45]
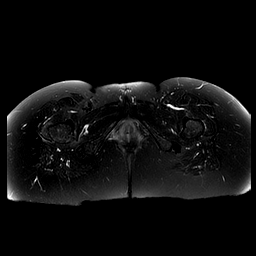
[im 28/45]
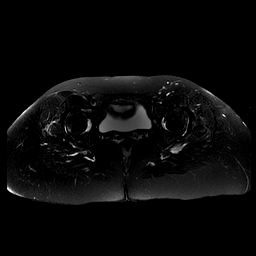
[im 34/45]
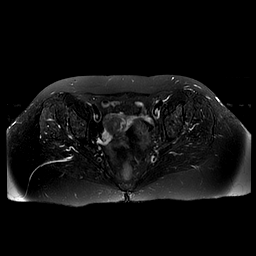
[im 39/45]
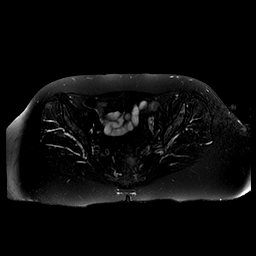
[im 45/45]
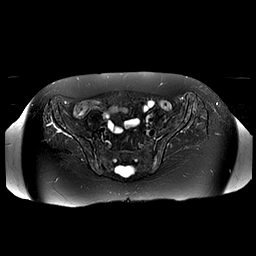

[Series 7: PD fat-sat · sagittal · 4.0mm · 0.70mm/px · 6 of 33 slices shown (1 of 3)]
[im 1/33]
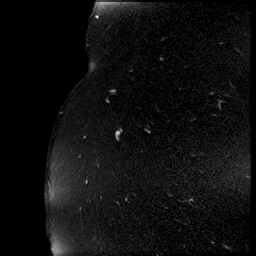
[im 7/33]
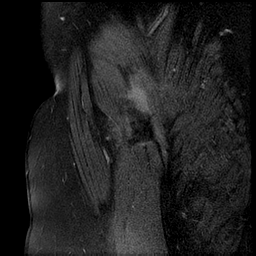
[im 13/33]
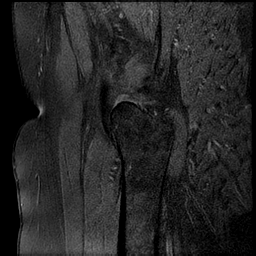
[im 20/33]
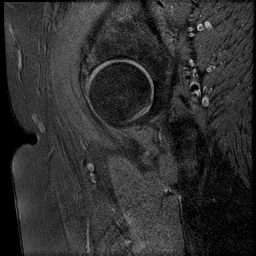
[im 26/33]
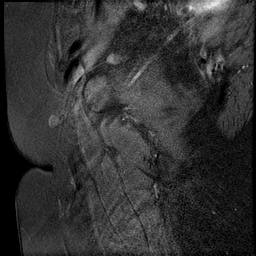
[im 33/33]
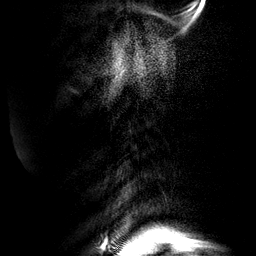

[Series 8: PD fat-sat · coronal · 4.0mm · 0.70mm/px · 5 of 24 slices shown (2 of 3)]
[im 1/24]
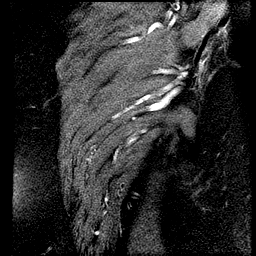
[im 6/24]
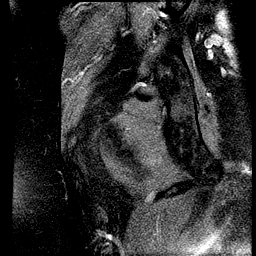
[im 12/24]
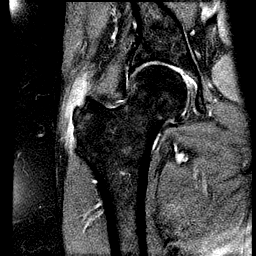
[im 18/24]
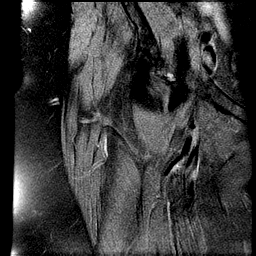
[im 24/24]
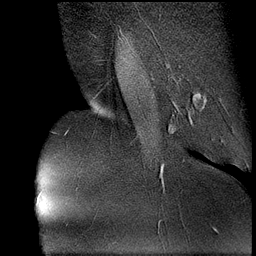

[Series 10: PD fat-sat · axial · 4.0mm · 0.70mm/px · z∈[-94,+56]mm · 6 of 31 slices shown (3 of 3)]
[im 1/31]
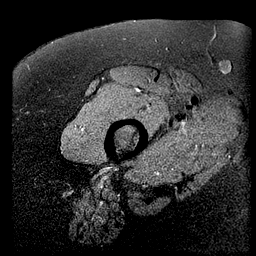
[im 7/31]
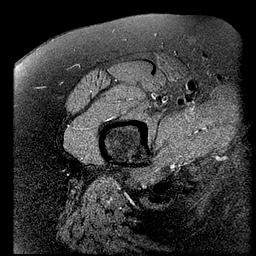
[im 13/31]
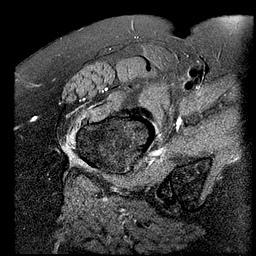
[im 19/31]
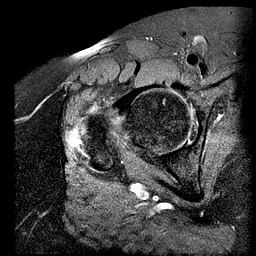
[im 25/31]
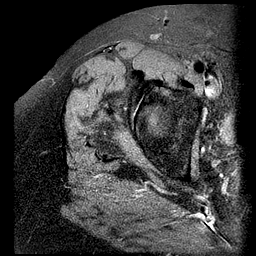
[im 31/31]
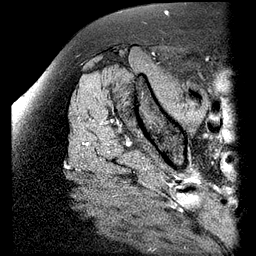

[36 of 40 positions shown; findings below may reference images not displayed]

FINDINGS: Bones: The left hip is included on large field-of-view images and
there appears to be mild-to-moderate superior left femoral head and
acetabular cartilage thinning. No acute fracture or avascular
necrosis is seen within the visualized portions of the pelvis or
either proximal femur. Mild degenerative changes of the pubic
symphysis.

Articular cartilage and labrum

Moderate right superior femoral head and acetabular cartilage
thinning. Normal morphology of the right femoral head-neck junction
without CAM-type bump deformity. Mild cystic changes at the anterior
superior right acetabulum, appearing to be outside the bone and
likely paralabral cysts.

Joint or bursal effusion

Joint effusion:  No right hip joint effusion.

Bursae: Moderate right and minimal left fluid within the
trochanteric bursae.

Muscles and tendons

Muscles and tendons: The origins of the bilateral rectus femoris
tendons and the bilateral common hamstring tendon origins are
intact. There is mild fluid deep to the anterior right gluteus
medius tendon insertion, likely a mild partial-thickness tear. The
bilateral gluteus minimus and left gluteus medius tendon insertions
are intact.

The rectus abdominis-adductor aponeuroses are intact.

Other findings

Miscellaneous: The uterus is present. No gross adnexal abnormality.
Mild sigmoid diverticulosis.

Severe right L4-5 and moderate to severe diffuse L5-S1 degenerative
disc changes.
IMPRESSION: :
IMPRESSION: 1. Mild-to-moderate bilateral femoroacetabular osteoarthritis.
2. Moderate right and minimal left trochanteric bursitis.
3. Mild anterior gluteus medius insertional partial-thickness tear.

## 2022-11-06 DIAGNOSIS — M5416 Radiculopathy, lumbar region: Secondary | ICD-10-CM | POA: Diagnosis not present

## 2022-11-06 DIAGNOSIS — S32030A Wedge compression fracture of third lumbar vertebra, initial encounter for closed fracture: Secondary | ICD-10-CM | POA: Diagnosis not present

## 2022-11-09 NOTE — Progress Notes (Signed)
Remote pacemaker transmission.   

## 2022-11-15 ENCOUNTER — Telehealth: Payer: Self-pay | Admitting: *Deleted

## 2022-11-15 ENCOUNTER — Encounter: Payer: Self-pay | Admitting: Cardiology

## 2022-11-15 NOTE — Telephone Encounter (Signed)
   Pre-operative Risk Assessment    Patient Name: Tiffany Melton  DOB: 05/16/1951 MRN: [id number]     Request for Surgical Clearance    Procedure:  Dental Extraction - Amount of Teeth to be Pulled:  1  Date of Surgery:  Clearance TBD                                 Surgeon:  Margarette Canada DDS Surgeon's Group or Practice Name:  [facility] Phone number:  [phone] Fax number:  [phone]   Type of Clearance Requested:   - Pharmacy:  Hold Apixaban (Eliquis) need recommendation for taking.   Type of Anesthesia:  Local  or IV   Additional requests/questions:   Please fax clearance and any recommended instructions back to [phone]  to attention of [facility] surgical dental assistant  Signed, Marlou Sa   11/15/2022, 3:33 PM

## 2022-11-15 NOTE — Telephone Encounter (Signed)
    Primary Cardiologist: Rozann Lesches, MD  Chart reviewed as part of pre-operative protocol coverage. Simple dental extractions are considered low risk procedures per guidelines and generally do not require any specific cardiac clearance. It is also generally accepted that for simple extractions and dental cleanings, there is no need to interrupt blood thinner therapy.   SBE prophylaxis is not required for the patient.  I will route this recommendation to the requesting party via Epic fax function and remove from pre-op pool.  Please call with questions.  Lenna Sciara, NP 11/15/2022, 3:57 PM

## 2022-11-15 NOTE — Telephone Encounter (Signed)
Error

## 2022-12-08 ENCOUNTER — Encounter: Payer: Self-pay | Admitting: Cardiovascular Disease

## 2022-12-08 ENCOUNTER — Encounter: Payer: Medicare Other | Admitting: Internal Medicine

## 2022-12-08 ENCOUNTER — Ambulatory Visit: Payer: Medicare Other | Attending: Internal Medicine | Admitting: Cardiovascular Disease

## 2022-12-08 VITALS — BP 142/90 | HR 66 | Ht 68.0 in | Wt 176.8 lb

## 2022-12-08 DIAGNOSIS — I4892 Unspecified atrial flutter: Secondary | ICD-10-CM

## 2022-12-08 DIAGNOSIS — I480 Paroxysmal atrial fibrillation: Secondary | ICD-10-CM

## 2022-12-08 LAB — CUP PACEART INCLINIC DEVICE CHECK
Battery Remaining Longevity: 21 mo
Battery Voltage: 2.92 V
Brady Statistic AP VP Percent: 1.38 %
Brady Statistic AP VS Percent: 79.62 %
Brady Statistic AS VP Percent: 0.44 %
Brady Statistic AS VS Percent: 18.57 %
Brady Statistic RA Percent Paced: 76.29 %
Brady Statistic RV Percent Paced: 1.81 %
Date Time Interrogation Session: 20240301122742
Implantable Lead Connection Status: 753985
Implantable Lead Connection Status: 753985
Implantable Lead Implant Date: 20150727
Implantable Lead Implant Date: 20150727
Implantable Lead Location: 753859
Implantable Lead Location: 753860
Implantable Lead Model: 5076
Implantable Lead Model: 5076
Implantable Pulse Generator Implant Date: 20150727
Lead Channel Impedance Value: 475 Ohm
Lead Channel Impedance Value: 513 Ohm
Lead Channel Impedance Value: 570 Ohm
Lead Channel Impedance Value: 608 Ohm
Lead Channel Pacing Threshold Amplitude: 0.75 V
Lead Channel Pacing Threshold Amplitude: 2.5 V
Lead Channel Pacing Threshold Amplitude: 2.5 V
Lead Channel Pacing Threshold Pulse Width: 0.4 ms
Lead Channel Pacing Threshold Pulse Width: 0.4 ms
Lead Channel Pacing Threshold Pulse Width: 1 ms
Lead Channel Sensing Intrinsic Amplitude: 0.25 mV
Lead Channel Sensing Intrinsic Amplitude: 3.875 mV
Lead Channel Sensing Intrinsic Amplitude: 4.5 mV
Lead Channel Sensing Intrinsic Amplitude: 4.875 mV
Lead Channel Setting Pacing Amplitude: 2 V
Lead Channel Setting Pacing Amplitude: 3.5 V
Lead Channel Setting Pacing Pulse Width: 1 ms
Lead Channel Setting Sensing Sensitivity: 2 mV
Zone Setting Status: 755011
Zone Setting Status: 755011

## 2022-12-08 NOTE — Progress Notes (Signed)
PCP: Wanita Chamberlain, PA-C Primary Cardiologist: Dr Domenic Polite Primary EP:  Dr Myles Gip  Tiffany Melton is a 72 y.o. female who presents today for routine electrophysiology followup.  Since last being seen in our clinic, the patient reports doing reasonably well.    She has paroxysms of rapid palpitations that are very disconcerting to her.  These occur fairly infrequently but medically affect her quality of life. Otherwise, not had any chest pain, presyncope, syncope.  Past Medical History:  Diagnosis Date   Abnormal stress test    GXT 9/13, cath revealed normal coronary arteries   Dysrhythmia    GERD (gastroesophageal reflux disease)    Pacemaker    Paroxysmal atrial fibrillation (HCC)    Raynaud's disease    Sick sinus syndrome ([place])    Sleep apnea    Past Surgical History:  Procedure Laterality Date   BACK SURGERY     BUNIONECTOMY WITH WEIL OSTEOTOMY Right    CARDIAC CATHETERIZATION  10/10/2011   CHOLECYSTECTOMY     PACEMAKER INSERTION  05/04/2014   MDT Advisa dual chamber pacemaker implanted by Dr Caryl Comes for SSS, atrial fibrillation post termination pauses   PERMANENT PACEMAKER INSERTION N/A 05/04/2014   Procedure: [place];  Surgeon: Deboraha Sprang, MD;  Location: [facility] CATH LAB;  Service: Cardiovascular;  Laterality: N/A;   TONSILLECTOMY      ROS- all systems are reviewed and negative except as per HPI above  Current Outpatient Medications  Medication Sig Dispense Refill   apixaban (ELIQUIS) 5 MG TABS tablet TAKE (1) TABLET TWICE DAILY. 60 tablet 5   calcium-vitamin D 250-100 MG-UNIT tablet Take 1 tablet by mouth 2 (two) times daily.     diltiazem (CARDIZEM CD) 240 MG 24 hr capsule TAKE 1 CAPSULE BY MOUTH ONCE DAILY. 90 capsule 1   diltiazem (CARDIZEM) 30 MG tablet Take 1 tablet (30 mg total) by mouth as needed. 30 tablet 1   pantoprazole (PROTONIX) 20 MG tablet Take 1 tablet (20 mg total) by mouth daily. 90 tablet 3   simvastatin (ZOCOR) 10 MG tablet  TAKE 1 TABLET DAILY 90 tablet 3   traZODone (DESYREL) 50 MG tablet Take 50 mg by mouth at bedtime.     Vibegron (GEMTESA) 75 MG TABS Take 1 capsule by mouth daily. 30 tablet 11   No current facility-administered medications for this visit.    Physical Exam: Vitals:   12/08/22 0906  BP: (!) 142/90  Pulse: 66  SpO2: 99%  Weight: 176 lb 12.8 oz (80.2 kg)  Height: '5\' 8"'$  (1.727 m)     Gen: Appears comfortable, well-nourished CV: RRR, no dependent edema The device site is normal - no tenderness, edema, drainage, redness, threatened erosion. Pulm: breathing easily   Pacemaker interrogation- reviewed in detail today,  See PACEART report Of note, she has episodes within atrial cycle length of about 240 ms and ventricular rate of about 150 beats minute consistent with atrial flutter.  Assessment and Plan:  1. Symptomatic sinus bradycardia  Normal pacemaker function See Pace Art report No changes today she is not device dependant today RV threshold is chronically elevated.  She does not RV pace and therefore would not require lead revision  2. Paroxysmal atrial fibrillation and likely atrial flutter AF Burden is 3.4 % (previuosly 3%) Continue eliquis Her burden is trending up over the years, V-rates are uncontrolled, and she is very symptomatic.  We discussed rhythm control options including antiarrhythmic drugs and ablation. We discussed the  indication, rationale, logistics, anticipated benefits, and potential risks of the ablation procedure including but not limited to -- bleed at the groin access site, chest pain, damage to nearby organs such as the diaphragm, lungs, or esophagus, need for a drainage tube, or prolonged hospitalization. I explained that the risk for stroke, heart attack, need for open chest surgery, or even death is very low but not zero. she  expressed understanding and prefers ablation to antiarrhythmic drug therapy.   3. overweight Body mass index is 26.88  kg/m. Lifestyle change advised Fasting lipids ordered  Risks, benefits and potential toxicities for medications prescribed and/or refilled reviewed with patient today.    Melida Quitter, MD 12/08/2022 9:27 AM

## 2022-12-08 NOTE — Patient Instructions (Signed)
Medication Instructions:  Continue all current medications.  Labwork: none  Testing/Procedures: Afib / flutter ablation   Follow-Up: Pending procedure   Any Other Special Instructions Will Be Listed Below (If Applicable).   If you need a refill on your cardiac medications before your next appointment, please call your pharmacy.;

## 2022-12-21 ENCOUNTER — Other Ambulatory Visit: Payer: Self-pay | Admitting: Cardiology

## 2022-12-21 DIAGNOSIS — Z1231 Encounter for screening mammogram for malignant neoplasm of breast: Secondary | ICD-10-CM | POA: Diagnosis not present

## 2022-12-21 NOTE — Telephone Encounter (Signed)
Prescription refill request for Eliquis received. Indication: PAF Last office visit: 12/08/22  A Mealor MD Scr: 0.74 on 12/19/21 Age: 72 Weight: 80.2kg  Based on above findings Eliquis '5mg'$  twice daily is the appropriate dose.  Refill approved.  Requested CBC/BMP be done at upcoming appt with Dr Domenic Polite in May.

## 2022-12-25 ENCOUNTER — Telehealth: Payer: Self-pay | Admitting: Cardiology

## 2022-12-25 MED ORDER — DILTIAZEM HCL ER COATED BEADS 240 MG PO CP24: 240.0000 mg | ORAL_CAPSULE | Freq: Every day | ORAL | 1 refills | Status: DC

## 2022-12-25 NOTE — Telephone Encounter (Signed)
*  STAT* If patient is at the pharmacy, call can be transferred to refill team.   1. Which medications need to be refilled? (please list name of each medication and dose if known) diltiazem (CARDIZEM CD) 240 MG 24 hr capsule TAKE 1 CAPSULE BY MOUTH ONCE DAILY.   2. Which pharmacy/location (including street and city if local pharmacy) is medication to be sent to?[place], [place] - [place]   3. Do they need a 30 day or 90 day supply? 90 Day Supply

## 2022-12-25 NOTE — Telephone Encounter (Signed)
Done

## 2023-01-09 ENCOUNTER — Ambulatory Visit (INDEPENDENT_AMBULATORY_CARE_PROVIDER_SITE_OTHER): Payer: Medicare Other

## 2023-01-09 DIAGNOSIS — I495 Sick sinus syndrome: Secondary | ICD-10-CM

## 2023-01-09 LAB — CUP PACEART REMOTE DEVICE CHECK
Battery Remaining Longevity: 22 mo
Battery Voltage: 2.91 V
Brady Statistic AP VP Percent: 0.83 %
Brady Statistic AP VS Percent: 77.76 %
Brady Statistic AS VP Percent: 0.35 %
Brady Statistic AS VS Percent: 21.06 %
Brady Statistic RA Percent Paced: 76.1 %
Brady Statistic RV Percent Paced: 1.27 %
Date Time Interrogation Session: 20240402070135
Implantable Lead Connection Status: 753985
Implantable Lead Connection Status: 753985
Implantable Lead Implant Date: 20150727
Implantable Lead Implant Date: 20150727
Implantable Lead Location: 753859
Implantable Lead Location: 753860
Implantable Lead Model: 5076
Implantable Lead Model: 5076
Implantable Pulse Generator Implant Date: 20150727
Lead Channel Impedance Value: 399 Ohm
Lead Channel Impedance Value: 475 Ohm
Lead Channel Impedance Value: 513 Ohm
Lead Channel Impedance Value: 532 Ohm
Lead Channel Pacing Threshold Amplitude: 0.75 V
Lead Channel Pacing Threshold Amplitude: 2.5 V
Lead Channel Pacing Threshold Pulse Width: 0.4 ms
Lead Channel Pacing Threshold Pulse Width: 0.4 ms
Lead Channel Sensing Intrinsic Amplitude: 2.25 mV
Lead Channel Sensing Intrinsic Amplitude: 2.25 mV
Lead Channel Sensing Intrinsic Amplitude: 3.125 mV
Lead Channel Sensing Intrinsic Amplitude: 3.125 mV
Lead Channel Setting Pacing Amplitude: 2 V
Lead Channel Setting Pacing Amplitude: 3.5 V
Lead Channel Setting Pacing Pulse Width: 1 ms
Lead Channel Setting Sensing Sensitivity: 2 mV
Zone Setting Status: 755011
Zone Setting Status: 755011

## 2023-01-29 ENCOUNTER — Telehealth: Payer: Self-pay | Admitting: Cardiovascular Disease

## 2023-01-29 NOTE — Telephone Encounter (Signed)
Pt called and stated on her last visit she thought there was going to be an ablation Scheduled.  She would like to know the status of this?    Best number [phone]

## 2023-01-31 ENCOUNTER — Telehealth: Payer: Self-pay | Admitting: Cardiovascular Disease

## 2023-01-31 NOTE — Telephone Encounter (Signed)
Spoke with patient and added ablation to procedure calendar.

## 2023-01-31 NOTE — Telephone Encounter (Signed)
Patient returned RN's call and stated best number to reach her is her mobile - [phone].

## 2023-02-12 ENCOUNTER — Telehealth: Payer: Self-pay

## 2023-02-12 DIAGNOSIS — I480 Paroxysmal atrial fibrillation: Secondary | ICD-10-CM

## 2023-02-12 NOTE — Telephone Encounter (Signed)
Spoke with patient about ablation, scheduled appointment with provider and ordered lab work. No further needs at this time

## 2023-02-20 NOTE — Progress Notes (Signed)
Remote pacemaker transmission.   

## 2023-02-21 DIAGNOSIS — Z6828 Body mass index (BMI) 28.0-28.9, adult: Secondary | ICD-10-CM | POA: Diagnosis not present

## 2023-02-21 DIAGNOSIS — J309 Allergic rhinitis, unspecified: Secondary | ICD-10-CM | POA: Diagnosis not present

## 2023-02-21 DIAGNOSIS — J029 Acute pharyngitis, unspecified: Secondary | ICD-10-CM | POA: Diagnosis not present

## 2023-02-21 DIAGNOSIS — R030 Elevated blood-pressure reading, without diagnosis of hypertension: Secondary | ICD-10-CM | POA: Diagnosis not present

## 2023-02-23 NOTE — Progress Notes (Unsigned)
    Cardiology Office Note  Date: 02/26/2023   ID: Tiffany Melton, DOB Dec 19, 1950, MRN [id number]  History of Present Illness: Tiffany Melton is a 72 y.o. female last seen in May 2023.  She is here for a routine visit.  Reports a general sense of increased palpitations over the last year, has been using her short acting Cardizem at least once a month.  Otherwise remains on Cardizem CD and Eliquis as before.  ECG today shows an atrial paced rhythm.  Medtronic pacemaker in place with follow-up by Dr. Nelly Laurence.  Device interrogation in April demonstrated brief episode of atrial fibrillation less than 2 minutes, overall 3% AT/AF rhythm burden.  I reviewed the office note from March, plan is for atrial fibrillation ablation in October.  She did ask about atrial fibrillation ablation today and general expectations.  She is very symptomatic when she is in atrial fibrillation and has increased ventricular rates.  Hopefully she would notice a difference following ablation, although I do wonder somewhat given relatively low frequency of her arrhythmia at baseline.  She does have a visit with Dr. Nelly Laurence in September for further discussion.  Antiarrhythmic therapy would be another potential option.  Physical Exam: VS:  BP 118/70   Pulse 66   Ht 5\' 7"  (1.702 m)   Wt 177 lb (80.3 kg)   SpO2 100%   BMI 27.72 kg/m , BMI Body mass index is 27.72 kg/m.  Wt Readings from Last 3 Encounters:  02/26/23 177 lb (80.3 kg)  12/08/22 176 lb 12.8 oz (80.2 kg)  09/27/22 178 lb 9.2 oz (81 kg)    General: Patient appears comfortable at rest. HEENT: Conjunctiva and lids normal. Neck: Supple, no elevated JVP or carotid bruits. Lungs: Clear to auscultation, nonlabored breathing at rest. Cardiac: Regular rate and rhythm, no S3 or significant systolic murmur.  ECG:  An ECG dated 12/16/2021 was personally reviewed today and demonstrated:  Atrial paced rhythm.  Labwork:    Component Value Date/Time   CHOL 159 12/19/2021  0929   TRIG 59 12/19/2021 0929   HDL 56 12/19/2021 0929   CHOLHDL 2.8 12/19/2021 0929   VLDL 12 12/19/2021 0929   LDLCALC 91 12/19/2021 0929   Other Studies Reviewed Today:  No interval cardiac testing for review today.  Assessment and Plan:  1.  Paroxysmal atrial fibrillation with CHA2DS2-VASc score of 2.  She is being considered for atrial fibrillation ablation in October by Dr. Nelly Laurence, chart reviewed.  She continues on Eliquis and Cardizem CD with short acting Cardizem available for palpitations.  ECG shows atrial pacing today.  No changes were made.  Encouraged her to get a follow-up physical with PCP this year and baseline lab work.  She does not report any spontaneous bleeding problems on Eliquis.  2.  Sick sinus syndrome with Medtronic pacemaker in place and followed by Dr. Nelly Laurence.  Disposition:  Follow up  1 year.  Signed, Jonelle Sidle, M.D., F.A.C.C. [facility]

## 2023-02-26 ENCOUNTER — Encounter: Payer: Self-pay | Admitting: Cardiology

## 2023-02-26 ENCOUNTER — Ambulatory Visit: Payer: Medicare Other | Attending: Cardiology | Admitting: Cardiology

## 2023-02-26 VITALS — BP 118/70 | HR 66 | Ht 67.0 in | Wt 177.0 lb

## 2023-02-26 DIAGNOSIS — I495 Sick sinus syndrome: Secondary | ICD-10-CM | POA: Insufficient documentation

## 2023-02-26 DIAGNOSIS — I480 Paroxysmal atrial fibrillation: Secondary | ICD-10-CM | POA: Diagnosis not present

## 2023-02-26 NOTE — Patient Instructions (Addendum)

## 2023-04-10 ENCOUNTER — Ambulatory Visit: Payer: Medicare Other

## 2023-04-10 DIAGNOSIS — I495 Sick sinus syndrome: Secondary | ICD-10-CM

## 2023-04-10 LAB — CUP PACEART REMOTE DEVICE CHECK
Battery Remaining Longevity: 17 mo
Battery Voltage: 2.9 V
Brady Statistic AP VP Percent: 0.49 %
Brady Statistic AP VS Percent: 82.83 %
Brady Statistic AS VP Percent: 0.45 %
Brady Statistic AS VS Percent: 16.23 %
Brady Statistic RA Percent Paced: 81.06 %
Brady Statistic RV Percent Paced: 0.96 %
Date Time Interrogation Session: 20240702105408
Implantable Lead Connection Status: 753985
Implantable Lead Connection Status: 753985
Implantable Lead Implant Date: 20150727
Implantable Lead Implant Date: 20150727
Implantable Lead Location: 753859
Implantable Lead Location: 753860
Implantable Lead Model: 5076
Implantable Lead Model: 5076
Implantable Pulse Generator Implant Date: 20150727
Lead Channel Impedance Value: 399 Ohm
Lead Channel Impedance Value: 475 Ohm
Lead Channel Impedance Value: 532 Ohm
Lead Channel Impedance Value: 532 Ohm
Lead Channel Pacing Threshold Amplitude: 0.75 V
Lead Channel Pacing Threshold Amplitude: 2.375 V
Lead Channel Pacing Threshold Pulse Width: 0.4 ms
Lead Channel Pacing Threshold Pulse Width: 0.4 ms
Lead Channel Sensing Intrinsic Amplitude: 2.625 mV
Lead Channel Sensing Intrinsic Amplitude: 2.625 mV
Lead Channel Sensing Intrinsic Amplitude: 3.375 mV
Lead Channel Sensing Intrinsic Amplitude: 3.375 mV
Lead Channel Setting Pacing Amplitude: 2 V
Lead Channel Setting Pacing Amplitude: 3.5 V
Lead Channel Setting Pacing Pulse Width: 1 ms
Lead Channel Setting Sensing Sensitivity: 2 mV
Zone Setting Status: 755011
Zone Setting Status: 755011

## 2023-04-27 ENCOUNTER — Other Ambulatory Visit: Payer: Self-pay | Admitting: Cardiology

## 2023-04-27 ENCOUNTER — Ambulatory Visit (INDEPENDENT_AMBULATORY_CARE_PROVIDER_SITE_OTHER): Payer: Medicare Other | Admitting: Urology

## 2023-04-27 VITALS — BP 102/70 | HR 89

## 2023-04-27 DIAGNOSIS — N3281 Overactive bladder: Secondary | ICD-10-CM

## 2023-04-27 LAB — URINALYSIS, ROUTINE W REFLEX MICROSCOPIC
Bilirubin, UA: NEGATIVE
Glucose, UA: NEGATIVE
Ketones, UA: NEGATIVE
Nitrite, UA: NEGATIVE
Protein,UA: NEGATIVE
Specific Gravity, UA: <1.005 — ABNORMAL LOW (ref 1.005–1.030)
Urobilinogen, Ur: 0.2 mg/dL (ref 0.2–1.0)
pH, UA: 7 (ref 5.0–7.5)

## 2023-04-27 LAB — MICROSCOPIC EXAMINATION: Bacteria, UA: NONE SEEN

## 2023-04-27 MED ORDER — GEMTESA 75 MG PO TABS
75.0000 mg | ORAL_TABLET | Freq: Every day | ORAL | 11 refills | Status: DC
Start: 2023-04-27 — End: 2023-05-01

## 2023-04-27 NOTE — Progress Notes (Unsigned)
04/27/2023 10:12 AM   Tiffany Melton 1950/10/23 [id number]  Referring provider: Dion Saucier, PA-C [address] [place],  [place] [address]  Followup OAB   HPI: [name] is a 72yo her efor followup for OAB Nocturia 0-1x. No urge incontinence. No urinary frequency and urgency. She remains on gemtesa 75mg  daily. No recent UTI. No other complaints today  PMH: Past Medical History:  Diagnosis Date   Abnormal stress test    GXT 9/13, cath revealed normal coronary arteries   Dysrhythmia    GERD (gastroesophageal reflux disease)    Pacemaker    Paroxysmal atrial fibrillation (HCC)    Raynaud's disease    Sick sinus syndrome (HCC)    Sleep apnea     Surgical History: Past Surgical History:  Procedure Laterality Date   BACK SURGERY     BUNIONECTOMY WITH WEIL OSTEOTOMY Right    CARDIAC CATHETERIZATION  10/10/2011   CHOLECYSTECTOMY     PACEMAKER INSERTION  05/04/2014   MDT Advisa dual chamber pacemaker implanted by Dr Graciela Husbands for SSS, atrial fibrillation post termination pauses   PERMANENT PACEMAKER INSERTION N/A 05/04/2014   Procedure: PERMANENT PACEMAKER INSERTION;  Surgeon: Duke Salvia, MD;  Location: [facility] CATH LAB;  Service: Cardiovascular;  Laterality: N/A;   TONSILLECTOMY      Home Medications:  Allergies as of 04/27/2023   No Known Allergies      Medication List        Accurate as of April 27, 2023 10:12 AM. If you have any questions, ask your nurse or doctor.          calcium-vitamin D 250-100 MG-UNIT tablet Take 1 tablet by mouth 2 (two) times daily.   diltiazem 240 MG 24 hr capsule Commonly known as: CARDIZEM CD Take 1 capsule (240 mg total) by mouth daily.   diltiazem 30 MG tablet Commonly known as: CARDIZEM Take 1 tablet (30 mg total) by mouth as needed.   Eliquis 5 MG Tabs tablet Generic drug: apixaban TAKE 1 TABLET BY MOUTH TWICE DAILY   Gemtesa 75 MG Tabs Generic drug: Vibegron Take 1 capsule by mouth daily.   pantoprazole 20 MG  tablet Commonly known as: PROTONIX Take 1 tablet (20 mg total) by mouth daily.   penicillin v potassium 500 MG tablet Commonly known as: VEETID Take 500 mg by mouth 4 (four) times daily.   simvastatin 10 MG tablet Commonly known as: ZOCOR TAKE 1 TABLET DAILY   traZODone 50 MG tablet Commonly known as: DESYREL Take 50 mg by mouth at bedtime.        Allergies: No Known Allergies  Family History: Family History  Problem Relation Age of Onset   Hypertension Other     Social History:  reports that she quit smoking about 40 years ago. Her smoking use included cigarettes. She started smoking about 60 years ago. She has a 35 pack-year smoking history. She has never used smokeless tobacco. She reports that she does not drink alcohol and does not use drugs.  ROS: All other review of systems were reviewed and are negative except what is noted above in HPI  Physical Exam: BP 102/70   Pulse 89   Constitutional:  Alert and oriented, No acute distress. HEENT: Tiffany Melton AT, moist mucus membranes.  Trachea midline, no masses. Cardiovascular: No clubbing, cyanosis, or edema. Respiratory: Normal respiratory effort, no increased work of breathing. GI: Abdomen is soft, nontender, nondistended, no abdominal masses GU: No CVA tenderness.  Lymph: No cervical  or inguinal lymphadenopathy. Skin: No rashes, bruises or suspicious lesions. Neurologic: Grossly intact, no focal deficits, moving all 4 extremities. Psychiatric: Normal mood and affect.  Laboratory Data: Lab Results  Component Value Date   WBC 5.2 12/19/2021   HGB 12.6 12/19/2021   HCT 39.2 12/19/2021   MCV 102.9 (H) 12/19/2021   PLT 199 12/19/2021    Lab Results  Component Value Date   CREATININE 0.74 12/19/2021    No results found for: "PSA"  No results found for: "TESTOSTERONE"  Lab Results  Component Value Date   HGBA1C 5.7 (H) 05/01/2014    Urinalysis    Component Value Date/Time   COLORURINE YELLOW 05/03/2014  1632   APPEARANCEUR Clear 04/28/2022 1133   LABSPEC 1.019 05/03/2014 1632   PHURINE 5.5 05/03/2014 1632   GLUCOSEU Negative 04/28/2022 1133   HGBUR NEGATIVE 05/03/2014 1632   BILIRUBINUR Negative 04/28/2022 1133   KETONESUR NEGATIVE 05/03/2014 1632   PROTEINUR Negative 04/28/2022 1133   PROTEINUR NEGATIVE 05/03/2014 1632   UROBILINOGEN 0.2 05/03/2014 1632   NITRITE Negative 04/28/2022 1133   NITRITE NEGATIVE 05/03/2014 1632   LEUKOCYTESUR Trace (A) 04/28/2022 1133    Lab Results  Component Value Date   LABMICR See below: 04/28/2022   WBCUA 0-5 04/28/2022   LABEPIT 0-10 04/28/2022   MUCUS Present 04/28/2022   BACTERIA Few 04/28/2022    Pertinent Imaging:  No results found for this or any previous visit.  No results found for this or any previous visit.  No results found for this or any previous visit.  No results found for this or any previous visit.  No results found for this or any previous visit.  No valid procedures specified. No results found for this or any previous visit.  No results found for this or any previous visit.   Assessment & Plan:    1. OAB (overactive bladder) -continue gemtesa 75mg  daily - Urinalysis, Routine w reflex microscopic   No follow-ups on file.  Tiffany Aye, MD  [facility]

## 2023-05-01 ENCOUNTER — Other Ambulatory Visit: Payer: Self-pay | Admitting: Urology

## 2023-05-01 DIAGNOSIS — N3281 Overactive bladder: Secondary | ICD-10-CM

## 2023-05-03 ENCOUNTER — Encounter: Payer: Self-pay | Admitting: Urology

## 2023-05-03 NOTE — Patient Instructions (Signed)

## 2023-05-04 NOTE — Progress Notes (Signed)
Remote pacemaker transmission.   

## 2023-05-25 DIAGNOSIS — I4891 Unspecified atrial fibrillation: Secondary | ICD-10-CM | POA: Diagnosis not present

## 2023-05-25 DIAGNOSIS — G4700 Insomnia, unspecified: Secondary | ICD-10-CM | POA: Diagnosis not present

## 2023-05-25 DIAGNOSIS — E782 Mixed hyperlipidemia: Secondary | ICD-10-CM | POA: Diagnosis not present

## 2023-05-25 DIAGNOSIS — Z0000 Encounter for general adult medical examination without abnormal findings: Secondary | ICD-10-CM | POA: Diagnosis not present

## 2023-05-25 DIAGNOSIS — Z1321 Encounter for screening for nutritional disorder: Secondary | ICD-10-CM | POA: Diagnosis not present

## 2023-05-25 DIAGNOSIS — R030 Elevated blood-pressure reading, without diagnosis of hypertension: Secondary | ICD-10-CM | POA: Diagnosis not present

## 2023-05-25 DIAGNOSIS — K5900 Constipation, unspecified: Secondary | ICD-10-CM | POA: Diagnosis not present

## 2023-05-25 DIAGNOSIS — E7849 Other hyperlipidemia: Secondary | ICD-10-CM | POA: Diagnosis not present

## 2023-05-25 DIAGNOSIS — R040 Epistaxis: Secondary | ICD-10-CM | POA: Diagnosis not present

## 2023-05-25 DIAGNOSIS — R5382 Chronic fatigue, unspecified: Secondary | ICD-10-CM | POA: Diagnosis not present

## 2023-05-25 DIAGNOSIS — Z6827 Body mass index (BMI) 27.0-27.9, adult: Secondary | ICD-10-CM | POA: Diagnosis not present

## 2023-05-25 DIAGNOSIS — B351 Tinea unguium: Secondary | ICD-10-CM | POA: Diagnosis not present

## 2023-05-25 DIAGNOSIS — E559 Vitamin D deficiency, unspecified: Secondary | ICD-10-CM | POA: Diagnosis not present

## 2023-05-25 DIAGNOSIS — Z23 Encounter for immunization: Secondary | ICD-10-CM | POA: Diagnosis not present

## 2023-06-15 ENCOUNTER — Other Ambulatory Visit: Payer: Self-pay | Admitting: Cardiology

## 2023-06-26 ENCOUNTER — Other Ambulatory Visit: Payer: Self-pay | Admitting: Cardiology

## 2023-06-26 NOTE — Telephone Encounter (Signed)
Pt last saw Dr Diona Browner 02/26/23, last labs 05/25/23 Creat 0.91, age 72, weight 80.3kg, based on specified criteria pt is on appropriate dosage of Eliquis 5mg  BID for afib.  Will refill rx.

## 2023-07-02 DIAGNOSIS — R5382 Chronic fatigue, unspecified: Secondary | ICD-10-CM | POA: Diagnosis not present

## 2023-07-02 DIAGNOSIS — I10 Essential (primary) hypertension: Secondary | ICD-10-CM | POA: Diagnosis not present

## 2023-07-06 ENCOUNTER — Encounter: Payer: Self-pay | Admitting: Cardiovascular Disease

## 2023-07-06 ENCOUNTER — Other Ambulatory Visit: Payer: Self-pay

## 2023-07-06 ENCOUNTER — Ambulatory Visit: Payer: Medicare Other | Attending: Cardiovascular Disease | Admitting: Cardiovascular Disease

## 2023-07-06 VITALS — BP 110/74 | HR 82 | Ht 67.0 in | Wt 177.8 lb

## 2023-07-06 DIAGNOSIS — I480 Paroxysmal atrial fibrillation: Secondary | ICD-10-CM

## 2023-07-06 NOTE — Progress Notes (Signed)
    PCP: Dion Saucier, PA-C Primary Cardiologist: Dr Diona Browner Primary EP:  Dr Nelly Laurence  Tiffany Melton is a 72 y.o. female who presents today for routine electrophysiology followup.  Since last being seen in our clinic, the patient reports doing reasonably well.    She has paroxysms of rapid palpitations that are very disconcerting to her.  These occur fairly infrequently but medically affect her quality of life. Otherwise, not had any chest pain, presyncope, syncope.   She reports that she is doing well. She is still having occasional episodes of palpitations.     Physical Exam:  Gen: Appears comfortable, well-nourished CV: RRR, no dependent edema The device site is normal - no tenderness, edema, drainage, redness, threatened erosion. Pulm: breathing easily   Pacemaker interrogation- reviewed in detail today,  See PACEART report   Assessment and Plan:  Symptomatic sinus bradycardia  Normal pacemaker function See Pace Art report No changes today she is not device dependant today RV threshold is chronically elevated.  She does not RV pace and therefore would not require lead revision  Paroxysmal atrial fibrillation and likely atrial flutter AF Burden is 3% (previuosly 3.3%) Continue eliquis Her burden is trending up over the years, V-rates are uncontrolled, and she is very symptomatic.  We discussed rhythm control options including antiarrhythmic drugs and ablation. We discussed the indication, rationale, logistics, anticipated benefits, and potential risks of the ablation procedure including but not limited to -- bleed at the groin access site, chest pain, damage to nearby organs such as the diaphragm, lungs, or esophagus, need for a drainage tube, or prolonged hospitalization. I explained that the risk for stroke, heart attack, need for open chest surgery, or even death is very low but not zero. she  expressed understanding and prefers ablation to antiarrhythmic drug  therapy.   overweight Body mass index is 27.85 kg/m. Lifestyle change advised Fasting lipids ordered  Risks, benefits and potential toxicities for medications prescribed and/or refilled reviewed with patient today.    Maurice Small, MD 07/06/2023 10:05 AM

## 2023-07-10 ENCOUNTER — Ambulatory Visit (INDEPENDENT_AMBULATORY_CARE_PROVIDER_SITE_OTHER): Payer: Medicare Other

## 2023-07-10 DIAGNOSIS — I495 Sick sinus syndrome: Secondary | ICD-10-CM | POA: Diagnosis not present

## 2023-07-10 LAB — CUP PACEART REMOTE DEVICE CHECK
Battery Remaining Longevity: 15 mo
Battery Voltage: 2.9 V
Brady Statistic AP VP Percent: 0.05 %
Brady Statistic AP VS Percent: 89.52 %
Brady Statistic AS VP Percent: 0 %
Brady Statistic AS VS Percent: 10.43 %
Brady Statistic RA Percent Paced: 88.92 %
Brady Statistic RV Percent Paced: 0.06 %
Date Time Interrogation Session: 20241001083036
Implantable Lead Connection Status: 753985
Implantable Lead Connection Status: 753985
Implantable Lead Implant Date: 20150727
Implantable Lead Implant Date: 20150727
Implantable Lead Location: 753859
Implantable Lead Location: 753860
Implantable Lead Model: 5076
Implantable Lead Model: 5076
Implantable Pulse Generator Implant Date: 20150727
Lead Channel Impedance Value: 399 Ohm
Lead Channel Impedance Value: 475 Ohm
Lead Channel Impedance Value: 513 Ohm
Lead Channel Impedance Value: 532 Ohm
Lead Channel Pacing Threshold Amplitude: 0.75 V
Lead Channel Pacing Threshold Amplitude: 2.375 V
Lead Channel Pacing Threshold Pulse Width: 0.4 ms
Lead Channel Pacing Threshold Pulse Width: 0.4 ms
Lead Channel Sensing Intrinsic Amplitude: 2.75 mV
Lead Channel Sensing Intrinsic Amplitude: 2.75 mV
Lead Channel Sensing Intrinsic Amplitude: 3.5 mV
Lead Channel Sensing Intrinsic Amplitude: 3.5 mV
Lead Channel Setting Pacing Amplitude: 2 V
Lead Channel Setting Pacing Amplitude: 3.5 V
Lead Channel Setting Pacing Pulse Width: 1 ms
Lead Channel Setting Sensing Sensitivity: 2 mV
Zone Setting Status: 755011
Zone Setting Status: 755011

## 2023-07-16 ENCOUNTER — Ambulatory Visit (HOSPITAL_COMMUNITY): Payer: Medicare Other

## 2023-07-25 NOTE — Progress Notes (Signed)
Remote pacemaker transmission.   

## 2023-08-16 DIAGNOSIS — R030 Elevated blood-pressure reading, without diagnosis of hypertension: Secondary | ICD-10-CM | POA: Diagnosis not present

## 2023-08-16 DIAGNOSIS — Z6827 Body mass index (BMI) 27.0-27.9, adult: Secondary | ICD-10-CM | POA: Diagnosis not present

## 2023-08-16 DIAGNOSIS — J069 Acute upper respiratory infection, unspecified: Secondary | ICD-10-CM | POA: Diagnosis not present

## 2023-08-16 DIAGNOSIS — Z20828 Contact with and (suspected) exposure to other viral communicable diseases: Secondary | ICD-10-CM | POA: Diagnosis not present

## 2023-09-26 DIAGNOSIS — I4891 Unspecified atrial fibrillation: Secondary | ICD-10-CM | POA: Diagnosis not present

## 2023-09-26 DIAGNOSIS — J0101 Acute recurrent maxillary sinusitis: Secondary | ICD-10-CM | POA: Diagnosis not present

## 2023-09-26 DIAGNOSIS — Z6828 Body mass index (BMI) 28.0-28.9, adult: Secondary | ICD-10-CM | POA: Diagnosis not present

## 2023-09-26 DIAGNOSIS — J069 Acute upper respiratory infection, unspecified: Secondary | ICD-10-CM | POA: Diagnosis not present

## 2023-09-26 DIAGNOSIS — Z20828 Contact with and (suspected) exposure to other viral communicable diseases: Secondary | ICD-10-CM | POA: Diagnosis not present

## 2023-09-26 DIAGNOSIS — R030 Elevated blood-pressure reading, without diagnosis of hypertension: Secondary | ICD-10-CM | POA: Diagnosis not present

## 2023-09-27 NOTE — Telephone Encounter (Signed)
Called patient to make her aware that instructions have been sent in MyChart as well as mailed to her.

## 2023-10-01 ENCOUNTER — Other Ambulatory Visit (HOSPITAL_COMMUNITY)
Admission: RE | Admit: 2023-10-01 | Discharge: 2023-10-01 | Disposition: A | Discharge status: Home / Self Care | Payer: Medicare Other | Source: Ambulatory Visit | Attending: Cardiovascular Disease | Admitting: Cardiovascular Disease

## 2023-10-01 DIAGNOSIS — I480 Paroxysmal atrial fibrillation: Secondary | ICD-10-CM | POA: Insufficient documentation

## 2023-10-01 LAB — BASIC METABOLIC PANEL
Anion gap: 7 (ref 5–15)
BUN: 19 mg/dL (ref 8–23)
CO2: 30 mmol/L (ref 22–32)
Calcium: 9.5 mg/dL (ref 8.9–10.3)
Chloride: 100 mmol/L (ref 98–111)
Creatinine, Ser: 0.8 mg/dL (ref 0.44–1.00)
GFR, Estimated: >60 mL/min (ref >60)
Glucose, Bld: 89 mg/dL (ref 70–99)
Potassium: 4.1 mmol/L (ref 3.5–5.1)
Sodium: 137 mmol/L (ref 135–145)

## 2023-10-01 LAB — CBC
HCT: 37.3 % (ref 36.0–46.0)
Hemoglobin: 12.3 g/dL (ref 12.0–15.0)
MCH: 33.2 pg (ref 26.0–34.0)
MCHC: 33 g/dL (ref 30.0–36.0)
MCV: 100.8 fL — ABNORMAL HIGH (ref 80.0–100.0)
Platelets: 187 10*3/uL (ref 150–400)
RBC: 3.7 MIL/uL — ABNORMAL LOW (ref 3.87–5.11)
RDW: 13.1 % (ref 11.5–15.5)
WBC: 5.3 10*3/uL (ref 4.0–10.5)
nRBC: 0 % (ref 0.0–0.2)

## 2023-10-08 ENCOUNTER — Ambulatory Visit (HOSPITAL_COMMUNITY)
Admission: RE | Admit: 2023-10-08 | Discharge: 2023-10-08 | Disposition: A | Discharge status: Home / Self Care | Payer: Medicare Other | Source: Ambulatory Visit | Attending: Cardiovascular Disease | Admitting: Cardiovascular Disease

## 2023-10-08 DIAGNOSIS — I480 Paroxysmal atrial fibrillation: Secondary | ICD-10-CM | POA: Diagnosis not present

## 2023-10-08 MED ORDER — IOHEXOL 350 MG/ML SOLN
95.0000 mL | Freq: Once | INTRAVENOUS | Status: AC | PRN
  Administered 2023-10-08: 95 mL via INTRAVENOUS

## 2023-10-09 ENCOUNTER — Ambulatory Visit (INDEPENDENT_AMBULATORY_CARE_PROVIDER_SITE_OTHER): Payer: Medicare Other

## 2023-10-09 DIAGNOSIS — I495 Sick sinus syndrome: Secondary | ICD-10-CM | POA: Diagnosis not present

## 2023-10-09 LAB — CUP PACEART REMOTE DEVICE CHECK
Battery Remaining Longevity: 11 mo
Battery Voltage: 2.88 V
Brady Statistic AP VP Percent: 0.72 %
Brady Statistic AP VS Percent: 83.39 %
Brady Statistic AS VP Percent: 0.66 %
Brady Statistic AS VS Percent: 15.23 %
Brady Statistic RA Percent Paced: 81.21 %
Brady Statistic RV Percent Paced: 1.47 %
Date Time Interrogation Session: 20241231111222
Implantable Lead Connection Status: 753985
Implantable Lead Connection Status: 753985
Implantable Lead Implant Date: 20150727
Implantable Lead Implant Date: 20150727
Implantable Lead Location: 753859
Implantable Lead Location: 753860
Implantable Lead Model: 5076
Implantable Lead Model: 5076
Implantable Pulse Generator Implant Date: 20150727
Lead Channel Impedance Value: 380 Ohm
Lead Channel Impedance Value: 456 Ohm
Lead Channel Impedance Value: 494 Ohm
Lead Channel Impedance Value: 513 Ohm
Lead Channel Pacing Threshold Amplitude: 0.75 V
Lead Channel Pacing Threshold Amplitude: 2.375 V
Lead Channel Pacing Threshold Pulse Width: 0.4 ms
Lead Channel Pacing Threshold Pulse Width: 0.4 ms
Lead Channel Sensing Intrinsic Amplitude: 0.375 mV
Lead Channel Sensing Intrinsic Amplitude: 0.375 mV
Lead Channel Sensing Intrinsic Amplitude: 3.25 mV
Lead Channel Sensing Intrinsic Amplitude: 3.25 mV
Lead Channel Setting Pacing Amplitude: 2 V
Lead Channel Setting Pacing Amplitude: 3.5 V
Lead Channel Setting Pacing Pulse Width: 1 ms
Lead Channel Setting Sensing Sensitivity: 2 mV
Zone Setting Status: 755011
Zone Setting Status: 755011

## 2023-10-25 NOTE — Anesthesia Preprocedure Evaluation (Addendum)
Anesthesia Evaluation  Patient identified by MRN, date of birth, ID band Patient awake    Reviewed: Allergy & Precautions, NPO status , Patient's Chart, lab work & pertinent test results  Airway Mallampati: II  TM Distance: >3 FB Neck ROM: Full    Dental no notable dental hx. (+) Dental Advisory Given   Pulmonary sleep apnea and Continuous Positive Airway Pressure Ventilation , former smoker   Pulmonary exam normal breath sounds clear to auscultation       Cardiovascular Exercise Tolerance: Good negative cardio ROS Normal cardiovascular exam+ dysrhythmias Atrial Fibrillation + pacemaker  Rhythm:Regular Rate:Normal  CT Coronary Ca2+ 09/2023 IMPRESSION: 1. There is normal pulmonary vein drainage into the left atrium with ostial measurements above. 2. There is no thrombus in the left atrial appendage. 3. The esophagus runs in the left atrial midline and is not in proximity to any of the pulmonary vein ostia 4. No PFO/ASD. 5. Normal coronary origin. Right dominance. 6. CAC score of 81 Agatston units which is 54th percentile for age-,race-, and sex-matched controls. This suggests intermediate risk for future cardiac events.   Echo 2020 1. The left ventricle has hyperdynamic systolic function, with an ejection fraction of >65%. The cavity size was normal. Left ventricular diastolic parameters were normal. No evidence of left ventricular regional wall motion abnormalities.   2. The right ventricle has normal systolic function. The cavity was normal. There is no increase in right ventricular wall thickness. Right ventricular systolic pressure normal with an estimated pressure of 22.48mmHg.   3. The pericardial effusion is posterior to the left ventricle.   4. Trivial pericardial effusion is present.   5. The aortic valve is tricuspid. Mild calcification of the aortic valve. Mild aortic annular calcification noted.   6. The mitral valve is  grossly normal. Mild thickening of the mitral valve leaflet.   7. The tricuspid valve is grossly normal.   8. The aortic root is normal in size and structure.     Neuro/Psych negative neurological ROS  negative psych ROS   GI/Hepatic Neg liver ROS,GERD  Medicated and Controlled,,  Endo/Other  negative endocrine ROS    Renal/GU negative Renal ROS     Musculoskeletal negative musculoskeletal ROS (+)    Abdominal   Peds  Hematology negative hematology ROS (+)   Anesthesia Other Findings   Reproductive/Obstetrics                              Anesthesia Physical Anesthesia Plan  ASA: 3  Anesthesia Plan: General   Post-op Pain Management: Tylenol PO (pre-op)*   Induction:   PONV Risk Score and Plan: 3 and Ondansetron, Dexamethasone and Treatment may vary due to age or medical condition  Airway Management Planned: Oral ETT  Additional Equipment: None  Intra-op Plan:   Post-operative Plan: Extubation in OR  Informed Consent: I have reviewed the patients History and Physical, chart, labs and discussed the procedure including the risks, benefits and alternatives for the proposed anesthesia with the patient or authorized representative who has indicated his/her understanding and acceptance.     Dental advisory given  Plan Discussed with: CRNA  Anesthesia Plan Comments:          Anesthesia Quick Evaluation

## 2023-10-25 NOTE — Pre-Procedure Instructions (Signed)
Instructed patient on the following items: Arrival time 0515 Nothing to eat or drink after midnight No meds AM of procedure Responsible person to drive you home and stay with you for 24 hrs  Have you missed any doses of anti-coagulant Eliquis-Takes twice a day, hasn't missed any doses.  Don't take dose in am.

## 2023-10-26 ENCOUNTER — Other Ambulatory Visit: Payer: Self-pay

## 2023-10-26 ENCOUNTER — Ambulatory Visit (HOSPITAL_COMMUNITY)
Admission: RE | Admit: 2023-10-26 | Discharge: 2023-10-26 | Disposition: A | Discharge status: Home / Self Care | Payer: Medicare Other | Attending: Cardiovascular Disease | Admitting: Cardiovascular Disease

## 2023-10-26 ENCOUNTER — Encounter (HOSPITAL_COMMUNITY): Payer: Self-pay | Admitting: Cardiovascular Disease

## 2023-10-26 ENCOUNTER — Ambulatory Visit (HOSPITAL_BASED_OUTPATIENT_CLINIC_OR_DEPARTMENT_OTHER): Payer: Medicare Other | Admitting: Anesthesiology

## 2023-10-26 ENCOUNTER — Ambulatory Visit (HOSPITAL_COMMUNITY)
Admission: RE | Disposition: A | Discharge status: Home / Self Care | Payer: Medicare Other | Source: Home / Self Care | Attending: Cardiovascular Disease

## 2023-10-26 ENCOUNTER — Ambulatory Visit (HOSPITAL_COMMUNITY): Payer: Medicare Other | Admitting: Anesthesiology

## 2023-10-26 DIAGNOSIS — R001 Bradycardia, unspecified: Secondary | ICD-10-CM | POA: Insufficient documentation

## 2023-10-26 DIAGNOSIS — Z6825 Body mass index (BMI) 25.0-25.9, adult: Secondary | ICD-10-CM | POA: Insufficient documentation

## 2023-10-26 DIAGNOSIS — I480 Paroxysmal atrial fibrillation: Secondary | ICD-10-CM | POA: Insufficient documentation

## 2023-10-26 DIAGNOSIS — Z7901 Long term (current) use of anticoagulants: Secondary | ICD-10-CM | POA: Insufficient documentation

## 2023-10-26 DIAGNOSIS — E663 Overweight: Secondary | ICD-10-CM | POA: Insufficient documentation

## 2023-10-26 DIAGNOSIS — I4891 Unspecified atrial fibrillation: Secondary | ICD-10-CM | POA: Diagnosis not present

## 2023-10-26 DIAGNOSIS — Z950 Presence of cardiac pacemaker: Secondary | ICD-10-CM | POA: Diagnosis not present

## 2023-10-26 DIAGNOSIS — G4730 Sleep apnea, unspecified: Secondary | ICD-10-CM | POA: Diagnosis not present

## 2023-10-26 DIAGNOSIS — K219 Gastro-esophageal reflux disease without esophagitis: Secondary | ICD-10-CM | POA: Diagnosis not present

## 2023-10-26 HISTORY — PX: ATRIAL FIBRILLATION ABLATION: EP1191

## 2023-10-26 LAB — POCT ACTIVATED CLOTTING TIME: Activated Clotting Time: 325 s

## 2023-10-26 SURGERY — ATRIAL FIBRILLATION ABLATION
Anesthesia: General

## 2023-10-26 MED ORDER — PROTAMINE SULFATE 10 MG/ML IV SOLN
INTRAVENOUS | Status: DC | PRN
  Administered 2023-10-26: 50 mg via INTRAVENOUS

## 2023-10-26 MED ORDER — CEFAZOLIN SODIUM-DEXTROSE 2-4 GM/100ML-% IV SOLN
INTRAVENOUS | Status: AC
  Filled 2023-10-26: qty 100

## 2023-10-26 MED ORDER — ONDANSETRON HCL 4 MG/2ML IJ SOLN
INTRAMUSCULAR | Status: DC | PRN
  Administered 2023-10-26: 4 mg via INTRAVENOUS

## 2023-10-26 MED ORDER — PROPOFOL 10 MG/ML IV BOLUS
INTRAVENOUS | Status: DC | PRN
  Administered 2023-10-26: 120 mg via INTRAVENOUS
  Administered 2023-10-26: 20 mg via INTRAVENOUS

## 2023-10-26 MED ORDER — SODIUM CHLORIDE 0.9 % IV SOLN: INTRAVENOUS | Status: DC

## 2023-10-26 MED ORDER — ATROPINE SULFATE 1 MG/ML IV SOLN
INTRAVENOUS | Status: DC | PRN
  Administered 2023-10-26: 1 mg via INTRAVENOUS

## 2023-10-26 MED ORDER — DEXAMETHASONE SODIUM PHOSPHATE 10 MG/ML IJ SOLN
INTRAMUSCULAR | Status: DC | PRN
  Administered 2023-10-26: 4 mg via INTRAVENOUS

## 2023-10-26 MED ORDER — FENTANYL CITRATE (PF) 250 MCG/5ML IJ SOLN
INTRAMUSCULAR | Status: DC | PRN
  Administered 2023-10-26: 50 ug via INTRAVENOUS

## 2023-10-26 MED ORDER — SODIUM CHLORIDE 0.9 % IV SOLN: 250.0000 mL | INTRAVENOUS | Status: DC | PRN

## 2023-10-26 MED ORDER — ACETAMINOPHEN 500 MG PO TABS
1000.0000 mg | ORAL_TABLET | Freq: Once | ORAL | Status: AC
  Administered 2023-10-26: 1000 mg via ORAL
  Filled 2023-10-26: qty 2

## 2023-10-26 MED ORDER — HEPARIN (PORCINE) IN NACL 1000-0.9 UT/500ML-% IV SOLN
INTRAVENOUS | Status: DC | PRN
  Administered 2023-10-26 (×3): 500 mL

## 2023-10-26 MED ORDER — CEFAZOLIN SODIUM-DEXTROSE 2-3 GM-%(50ML) IV SOLR
INTRAVENOUS | Status: DC | PRN
  Administered 2023-10-26: 2 g via INTRAVENOUS

## 2023-10-26 MED ORDER — ACETAMINOPHEN 325 MG PO TABS: 650.0000 mg | ORAL_TABLET | ORAL | Status: DC | PRN

## 2023-10-26 MED ORDER — FENTANYL CITRATE (PF) 100 MCG/2ML IJ SOLN
INTRAMUSCULAR | Status: AC
  Filled 2023-10-26: qty 2

## 2023-10-26 MED ORDER — HEPARIN SODIUM (PORCINE) 1000 UNIT/ML IJ SOLN
INTRAMUSCULAR | Status: DC | PRN
  Administered 2023-10-26: 14000 [IU] via INTRAVENOUS

## 2023-10-26 MED ORDER — SODIUM CHLORIDE 0.9% FLUSH: 3.0000 mL | Freq: Two times a day (BID) | INTRAVENOUS | Status: DC

## 2023-10-26 MED ORDER — PHENYLEPHRINE 80 MCG/ML (10ML) SYRINGE FOR IV PUSH (FOR BLOOD PRESSURE SUPPORT)
PREFILLED_SYRINGE | INTRAVENOUS | Status: DC | PRN
  Administered 2023-10-26 (×3): 80 ug via INTRAVENOUS

## 2023-10-26 MED ORDER — ONDANSETRON HCL 4 MG/2ML IJ SOLN: 4.0000 mg | Freq: Four times a day (QID) | INTRAMUSCULAR | Status: DC | PRN

## 2023-10-26 MED ORDER — ESMOLOL HCL 100 MG/10ML IV SOLN
INTRAVENOUS | Status: DC | PRN
  Administered 2023-10-26: 20 mg via INTRAVENOUS

## 2023-10-26 MED ORDER — PHENYLEPHRINE HCL-NACL 20-0.9 MG/250ML-% IV SOLN
INTRAVENOUS | Status: DC | PRN
  Administered 2023-10-26: 40 ug/min via INTRAVENOUS

## 2023-10-26 MED ORDER — LIDOCAINE 2% (20 MG/ML) 5 ML SYRINGE
INTRAMUSCULAR | Status: DC | PRN
  Administered 2023-10-26: 40 mg via INTRAVENOUS
  Administered 2023-10-26: 20 mg via INTRAVENOUS
  Administered 2023-10-26: 80 mg via INTRAVENOUS

## 2023-10-26 MED ORDER — SUGAMMADEX SODIUM 200 MG/2ML IV SOLN
INTRAVENOUS | Status: DC | PRN
  Administered 2023-10-26: 200 mg via INTRAVENOUS

## 2023-10-26 MED ORDER — HEPARIN SODIUM (PORCINE) 1000 UNIT/ML IJ SOLN
INTRAMUSCULAR | Status: AC
  Filled 2023-10-26: qty 20

## 2023-10-26 MED ORDER — ROCURONIUM BROMIDE 10 MG/ML (PF) SYRINGE
PREFILLED_SYRINGE | INTRAVENOUS | Status: DC | PRN
  Administered 2023-10-26: 60 mg via INTRAVENOUS

## 2023-10-26 MED ORDER — SODIUM CHLORIDE 0.9% FLUSH: 3.0000 mL | INTRAVENOUS | Status: DC | PRN

## 2023-10-26 MED ORDER — LACTATED RINGERS IV SOLN: INTRAVENOUS | Status: DC | PRN

## 2023-10-26 MED ORDER — EPHEDRINE SULFATE-NACL 50-0.9 MG/10ML-% IV SOSY
PREFILLED_SYRINGE | INTRAVENOUS | Status: DC | PRN
  Administered 2023-10-26 (×2): 10 mg via INTRAVENOUS

## 2023-10-26 MED ORDER — ATROPINE SULFATE 1 MG/10ML IJ SOSY
PREFILLED_SYRINGE | INTRAMUSCULAR | Status: AC
  Filled 2023-10-26: qty 10

## 2023-10-26 SURGICAL SUPPLY — 20 items
BLANKET WARM UNDERBOD FULL ACC (MISCELLANEOUS) ×1 IMPLANT
CABLE PFA RX CATH CONN (CABLE) IMPLANT
CATH FARAWAVE ABLATION 31 (CATHETERS) IMPLANT
CATH OCTARAY 2.0 F 3-3-3-3-3 (CATHETERS) IMPLANT
CATH SOUNDSTAR ECO 8FR (CATHETERS) IMPLANT
CATH WEBSTER BI DIR CS D-F CRV (CATHETERS) IMPLANT
CLOSURE PERCLOSE PROSTYLE (VASCULAR PRODUCTS) IMPLANT
COVER SWIFTLINK CONNECTOR (BAG) ×1 IMPLANT
DEVICE CLOSURE MYNXGRIP 6/7F (Vascular Products) IMPLANT
DILATOR VESSEL 38 20CM 16FR (INTRODUCER) IMPLANT
GUIDEWIRE INQWIRE 1.5J.035X260 (WIRE) IMPLANT
INQWIRE 1.5J .035X260CM (WIRE) ×1
KIT VERSACROSS CNCT FARADRIVE (KITS) IMPLANT
PACK EP LF (CUSTOM PROCEDURE TRAY) ×1 IMPLANT
PAD DEFIB RADIO PHYSIO CONN (PAD) ×1 IMPLANT
PATCH CARTO3 (PAD) IMPLANT
SHEATH FARADRIVE STEERABLE (SHEATH) IMPLANT
SHEATH PINNACLE 8F 10CM (SHEATH) IMPLANT
SHEATH PINNACLE 9F 10CM (SHEATH) IMPLANT
SHEATH PROBE COVER 6X72 (BAG) IMPLANT

## 2023-10-26 NOTE — Transfer of Care (Signed)
Immediate Anesthesia Transfer of Care Note  Patient: Tiffany Melton  Procedure(s) Performed: ATRIAL FIBRILLATION ABLATION  Patient Location: PACU  Anesthesia Type:General  Level of Consciousness: awake, alert , and oriented  Airway & Oxygen Therapy: Patient Spontanous Breathing and Patient connected to nasal cannula oxygen  Post-op Assessment: Report given to RN and Post -op Vital signs reviewed and stable  Post vital signs: Reviewed and stable  Last Vitals:  Vitals Value Taken Time  BP 129/56 10/26/23 0950  Temp 36.9 C 10/26/23 0950  Pulse 62 10/26/23 0955  Resp 11 10/26/23 0955  SpO2 98 % 10/26/23 0955  Vitals shown include unfiled device data.  Last Pain:  Vitals:   10/26/23 0950  TempSrc: Temporal  PainSc: 0-No pain         Complications: No notable events documented.

## 2023-10-26 NOTE — Discharge Instructions (Signed)

## 2023-10-26 NOTE — Progress Notes (Signed)
Patient and friend was given discharge instructions. Both verbalized understanding. 

## 2023-10-26 NOTE — Anesthesia Procedure Notes (Signed)
Procedure Name: Intubation Date/Time: 10/26/2023 7:50 AM  Performed by: Stanton Kidney, CRNAPre-anesthesia Checklist: Patient identified, Patient being monitored, Timeout performed, Emergency Drugs available and Suction available Patient Re-evaluated:Patient Re-evaluated prior to induction Oxygen Delivery Method: Circle system utilized Preoxygenation: Pre-oxygenation with 100% oxygen Induction Type: IV induction Ventilation: Mask ventilation without difficulty Laryngoscope Size: 3 and Miller Grade View: Grade I Tube type: Oral Tube size: 7.0 mm Number of attempts: 1 Airway Equipment and Method: Stylet Placement Confirmation: ETT inserted through vocal cords under direct vision, positive ETCO2 and breath sounds checked- equal and bilateral Secured at: 21 cm Tube secured with: Tape Dental Injury: Teeth and Oropharynx as per pre-operative assessment

## 2023-10-26 NOTE — H&P (Signed)
    PCP: Dion Saucier, PA-C Primary Cardiologist: Dr Diona Browner Primary EP:  Dr Nelly Laurence  Tiffany Melton is a 73 y.o. female who presents today for routine electrophysiology followup.  Since last being seen in our clinic, the patient reports doing reasonably well.    She has paroxysms of rapid palpitations that are very disconcerting to her.  These occur fairly infrequently but medically affect her quality of life. Otherwise, not had any chest pain, presyncope, syncope.   She reports that she is doing well. She is still having occasional episodes of palpitations.  I reviewed the patient's CT and labs. There was no LAA thrombus. she  has not missed any doses of anticoagulation, and she took her dose last night. There have been no changes in the patient's diagnoses, medications, or condition since our recent clinic visit.    Physical Exam:  Gen: Appears comfortable, well-nourished CV: RRR, no dependent edema The device site is normal - no tenderness, edema, drainage, redness, threatened erosion. Pulm: breathing easily   Pacemaker interrogation- reviewed in detail today,  See PACEART report   Assessment and Plan:  Symptomatic sinus bradycardia  Normal pacemaker function See Pace Art report No changes today she is not device dependant today RV threshold is chronically elevated.  She does not RV pace and therefore would not require lead revision  Paroxysmal atrial fibrillation and likely atrial flutter AF Burden is 3% (previuosly 3.3%) Continue eliquis Her burden is trending up over the years, V-rates are uncontrolled, and she is very symptomatic.  We discussed rhythm control options including antiarrhythmic drugs and ablation. We discussed the indication, rationale, logistics, anticipated benefits, and potential risks of the ablation procedure including but not limited to -- bleed at the groin access site, chest pain, damage to nearby organs such as the diaphragm, lungs, or esophagus,  need for a drainage tube, or prolonged hospitalization. I explained that the risk for stroke, heart attack, need for open chest surgery, or even death is very low but not zero. she  expressed understanding and prefers ablation to antiarrhythmic drug therapy.   overweight Body mass index is 25.55 kg/m. Lifestyle change advised Fasting lipids ordered  Risks, benefits and potential toxicities for medications prescribed and/or refilled reviewed with patient today.    Maurice Small, MD 10/26/2023 7:14 AM

## 2023-10-29 MED FILL — Fentanyl Citrate Preservative Free (PF) Inj 100 MCG/2ML: INTRAMUSCULAR | Qty: 1 | Status: AC

## 2023-10-29 MED FILL — Cefazolin Sodium-Dextrose IV Solution 2 GM/100ML-4%: INTRAVENOUS | Qty: 100 | Status: AC

## 2023-10-29 NOTE — Anesthesia Postprocedure Evaluation (Signed)
Anesthesia Post Note  Patient: Tiffany Melton  Procedure(s) Performed: ATRIAL FIBRILLATION ABLATION     Patient location during evaluation: PACU Anesthesia Type: General Level of consciousness: sedated and patient cooperative Pain management: pain level controlled Vital Signs Assessment: post-procedure vital signs reviewed and stable Respiratory status: spontaneous breathing Cardiovascular status: stable Anesthetic complications: no   No notable events documented.  Last Vitals:  Vitals:   10/26/23 1245 10/26/23 1300  BP: (!) 118/56 (!) 116/55  Pulse: 60 60  Resp: 19 16  Temp:    SpO2: 94% 93%    Last Pain:  Vitals:   10/26/23 1025  TempSrc: Temporal  PainSc: 0-No pain                 Lewie Loron

## 2023-10-31 ENCOUNTER — Encounter: Payer: Self-pay | Admitting: General Practice

## 2023-11-03 ENCOUNTER — Encounter (HOSPITAL_COMMUNITY): Payer: Self-pay

## 2023-11-03 ENCOUNTER — Emergency Department (HOSPITAL_COMMUNITY)
Admission: EM | Admit: 2023-11-03 | Discharge: 2023-11-03 | Disposition: A | Discharge status: Home / Self Care | Payer: Medicare Other | Attending: Emergency Medicine | Admitting: Emergency Medicine

## 2023-11-03 ENCOUNTER — Telehealth: Payer: Self-pay | Admitting: Cardiology

## 2023-11-03 ENCOUNTER — Emergency Department (HOSPITAL_COMMUNITY): Payer: Medicare Other

## 2023-11-03 ENCOUNTER — Other Ambulatory Visit: Payer: Self-pay

## 2023-11-03 DIAGNOSIS — R079 Chest pain, unspecified: Secondary | ICD-10-CM | POA: Diagnosis not present

## 2023-11-03 DIAGNOSIS — Z950 Presence of cardiac pacemaker: Secondary | ICD-10-CM | POA: Diagnosis not present

## 2023-11-03 DIAGNOSIS — R0602 Shortness of breath: Secondary | ICD-10-CM | POA: Diagnosis not present

## 2023-11-03 DIAGNOSIS — R42 Dizziness and giddiness: Secondary | ICD-10-CM | POA: Insufficient documentation

## 2023-11-03 DIAGNOSIS — M549 Dorsalgia, unspecified: Secondary | ICD-10-CM | POA: Insufficient documentation

## 2023-11-03 DIAGNOSIS — R0789 Other chest pain: Secondary | ICD-10-CM | POA: Diagnosis not present

## 2023-11-03 DIAGNOSIS — R9389 Abnormal findings on diagnostic imaging of other specified body structures: Secondary | ICD-10-CM | POA: Diagnosis not present

## 2023-11-03 LAB — CBC WITH DIFFERENTIAL/PLATELET
Abs Immature Granulocytes: 0.02 10*3/uL (ref 0.00–0.07)
Basophils Absolute: 0 10*3/uL (ref 0.0–0.1)
Basophils Relative: 1 %
Eosinophils Absolute: 0.1 10*3/uL (ref 0.0–0.5)
Eosinophils Relative: 1 %
HCT: 38.1 % (ref 36.0–46.0)
Hemoglobin: 12.2 g/dL (ref 12.0–15.0)
Immature Granulocytes: 0 %
Lymphocytes Relative: 26 %
Lymphs Abs: 1.5 10*3/uL (ref 0.7–4.0)
MCH: 32.3 pg (ref 26.0–34.0)
MCHC: 32 g/dL (ref 30.0–36.0)
MCV: 100.8 fL — ABNORMAL HIGH (ref 80.0–100.0)
Monocytes Absolute: 0.5 10*3/uL (ref 0.1–1.0)
Monocytes Relative: 9 %
Neutro Abs: 3.5 10*3/uL (ref 1.7–7.7)
Neutrophils Relative %: 63 %
Platelets: 210 10*3/uL (ref 150–400)
RBC: 3.78 MIL/uL — ABNORMAL LOW (ref 3.87–5.11)
RDW: 12.8 % (ref 11.5–15.5)
WBC: 5.7 10*3/uL (ref 4.0–10.5)
nRBC: 0 % (ref 0.0–0.2)

## 2023-11-03 LAB — BASIC METABOLIC PANEL
Anion gap: 11 (ref 5–15)
BUN: 15 mg/dL (ref 8–23)
CO2: 28 mmol/L (ref 22–32)
Calcium: 9.4 mg/dL (ref 8.9–10.3)
Chloride: 102 mmol/L (ref 98–111)
Creatinine, Ser: 0.75 mg/dL (ref 0.44–1.00)
GFR, Estimated: >60 mL/min (ref >60)
Glucose, Bld: 129 mg/dL — ABNORMAL HIGH (ref 70–99)
Potassium: 4.3 mmol/L (ref 3.5–5.1)
Sodium: 141 mmol/L (ref 135–145)

## 2023-11-03 LAB — BRAIN NATRIURETIC PEPTIDE: B Natriuretic Peptide: 93 pg/mL (ref 0.0–100.0)

## 2023-11-03 LAB — TROPONIN I (HIGH SENSITIVITY)
Troponin I (High Sensitivity): 33 ng/L — ABNORMAL HIGH (ref <18)
Troponin I (High Sensitivity): 30 ng/L — ABNORMAL HIGH (ref <18)

## 2023-11-03 LAB — D-DIMER, QUANTITATIVE: D-Dimer, Quant: <0.27 ug{FEU}/mL (ref 0.00–0.50)

## 2023-11-03 NOTE — Telephone Encounter (Signed)
Patient called in after having A-fib ablation done on 1/17.  Reports she was doing reasonably well up until Wednesday, 1/22 when she developed dizziness, lightheadedness, weakness.  Felt as if she was back in A-fib for period of time.  Felt somewhat better on Thursday but then had recurrent episodes of dizziness, chest pain and shortness of breath Friday night into today.  States the shortness of breath this somewhat better but she is having persistent centralized chest fullness/heaviness.  Nothing has seemed to help with her symptoms.  We discussed options, but given her ongoing symptoms would plan to have her evaluated to rule out pericarditis/cardial effusion. She is agreeable to be evaluated in the ED and thanked me for callback

## 2023-11-03 NOTE — ED Triage Notes (Signed)
Pt arrived via POV  from home c/o generalized chest pain that began 4 days ago. Pain subdued, but then returned today. Pt endorses dizziness.

## 2023-11-03 NOTE — ED Provider Notes (Signed)
[place] EMERGENCY DEPARTMENT AT [facility] Provider Note   CSN: [id number] Arrival date & time: 11/03/23  1426     History  Chief Complaint  Patient presents with   Chest Pain    Tiffany Melton is a 73 y.o. female.  HPI 73 year old female presents with chest pain.  8 days ago she had an ablation and was doing well for several days.  Then on 1/22 she started feeling bad.  A little bit of palpitations but also weakness and dizziness.  Got better the next day and then yesterday she started having chest pain last night.  Was primarily right of the midline and also going into her right back.  That pain was relatively severe last night though it is improved today.  However it is still present after she woke up.  Certain movements make it hurt a little worse but not really exertion.  There is no pleuritic component.  She does feel little short of breath.  Denies abdominal pain or leg swelling.  Called the on-call cardiology team who advised her to come in for evaluation. No current palpitations.  Home Medications Prior to Admission medications   Medication Sig Start Date End Date Taking? Authorizing Provider  acetaminophen (TYLENOL) 500 MG tablet Take 1,000 mg by mouth at bedtime.    [provider]  Calcium Carb-Cholecalciferol (CALCIUM 600 + D PO) Take 1 tablet by mouth 2 (two) times daily.    [provider]  calcium carbonate (TUMS EX) 750 MG chewable tablet Chew 2 tablets by mouth daily as needed for heartburn.    [provider]  diltiazem (CARDIZEM CD) 240 MG 24 hr capsule TAKE ONE CAPSULE BY MOUTH DAILY 06/15/23   Jonelle Sidle, MD  diltiazem (CARDIZEM) 30 MG tablet Take 1 tablet (30 mg total) by mouth as needed. 12/12/19   Allred, Fayrene Fearing, MD  ELIQUIS 5 MG TABS tablet TAKE 1 TABLET BY MOUTH TWICE DAILY 06/26/23   Jonelle Sidle, MD  GEMTESA 75 MG TABS TAKE 1 TABLET BY MOUTH DAILY 05/01/23   McKenzie, Mardene Celeste, MD  Multiple Vitamins-Minerals  (ADULT GUMMY PO) Take 2 capsules by mouth daily.    [provider]  Multiple Vitamins-Minerals (HAIR SKIN & NAILS) TABS Take 2 tablets by mouth daily.    [provider]  pantoprazole (PROTONIX) 20 MG tablet TAKE 1 TABLET DAILY 04/30/23   Jonelle Sidle, MD  simvastatin (ZOCOR) 10 MG tablet TAKE 1 TABLET DAILY 10/28/20   Jonelle Sidle, MD  traZODone (DESYREL) 100 MG tablet Take 50 mg by mouth at bedtime.    [provider]      Allergies    Patient has no known allergies.    Review of Systems   Review of Systems  Respiratory:  Positive for shortness of breath.   Cardiovascular:  Positive for chest pain. Negative for leg swelling.  Gastrointestinal:  Negative for abdominal pain.  Musculoskeletal:  Positive for back pain.  Neurological:  Positive for dizziness.    Physical Exam Updated Vital Signs BP 130/61   Pulse 60   Temp 97.8 F (36.6 C) (Oral)   Resp 19   Ht 5\' 9"  (1.753 m)   Wt 78.4 kg   SpO2 98%   BMI 25.52 kg/m  Physical Exam Vitals and nursing note reviewed.  Constitutional:      Appearance: She is well-developed.  HENT:     Head: Normocephalic and atraumatic.  Cardiovascular:  Rate and Rhythm: Normal rate and regular rhythm.     Pulses:          Radial pulses are 2+ on the right side and 2+ on the left side.     Heart sounds: Normal heart sounds.  Pulmonary:     Effort: Pulmonary effort is normal.     Breath sounds: Normal breath sounds.  Chest:     Chest wall: No tenderness.  Abdominal:     Palpations: Abdomen is soft.     Tenderness: There is no abdominal tenderness.  Musculoskeletal:     Cervical back: No tenderness.     Thoracic back: No tenderness.  Skin:    General: Skin is warm and dry.  Neurological:     Mental Status: She is alert.     ED Results / Procedures / Treatments   Labs (all labs ordered are listed, but only abnormal results are displayed) Labs Reviewed  BASIC METABOLIC PANEL - Abnormal;  Notable for the following components:      Result Value   Glucose, Bld 129 (*)    All other components within normal limits  CBC WITH DIFFERENTIAL/PLATELET - Abnormal; Notable for the following components:   RBC 3.78 (*)    MCV 100.8 (*)    All other components within normal limits  TROPONIN I (HIGH SENSITIVITY) - Abnormal; Notable for the following components:   Troponin I (High Sensitivity) 33 (*)    All other components within normal limits  TROPONIN I (HIGH SENSITIVITY) - Abnormal; Notable for the following components:   Troponin I (High Sensitivity) 30 (*)    All other components within normal limits  BRAIN NATRIURETIC PEPTIDE  D-DIMER, QUANTITATIVE    EKG EKG Interpretation Date/Time:  Saturday November 03 2023 14:40:38 EST Ventricular Rate:  62 PR Interval:  162 QRS Duration:  86 QT Interval:  418 QTC Calculation: 424 R Axis:   74  Text Interpretation: Atrial-paced rhythm  no acute ST/T changes Confirmed by Pricilla Loveless [phone]) on 11/03/2023 3:09:21 PM  Radiology DG Chest 2 View Result Date: 11/03/2023 CLINICAL DATA:  Chest pain. EXAM: CHEST - 2 VIEW COMPARISON:  05/05/2014. FINDINGS: Bilateral lung fields are clear. Bilateral costophrenic angles are clear. Note is made of elevated right hemidiaphragm. Normal cardio-mediastinal silhouette. There is a left sided 2-lead pacemaker. No acute osseous abnormalities. The soft tissues are within normal limits. IMPRESSION: No active cardiopulmonary disease. Electronically Signed   By: Jules Schick M.D.   On: 11/03/2023 15:33    Procedures Ultrasound ED Echo  Date/Time: 11/03/2023 6:30 PM  Performed by: Pricilla Loveless, MD Authorized by: Pricilla Loveless, MD   Procedure details:    Indications: chest pain     Views: parasternal long axis view     Images: archived     Limitations:  Acoustic shadowing Findings:    Pericardium: no pericardial effusion   Impression:    Impression: normal       Medications Ordered in  ED Medications - No data to display  ED Course/ Medical Decision Making/ A&P                                 Medical Decision Making Amount and/or Complexity of Data Reviewed External Data Reviewed: notes.    Details: Recent ablation procedure. Labs: ordered.    Details: Troponin is mildly elevated but flat on repeat.  No significant electrolyte disturbance.  Normal hemoglobin and D-dimer  Radiology: ordered and independent interpretation performed.    Details: No pneumothorax ECG/medicine tests: ordered and independent interpretation performed.    Details: Sinus rhythm   Patient presents with nonspecific chest pain since yesterday.  Troponin is slightly elevated with nothing to compare to.  Repeat is essentially unchanged.  I suspect this is probably more related to her procedure and the flat trend makes ACS pretty unlikely.  I doubt this is unstable angina.  Low risk for PE and has a negative D-dimer.  She otherwise has no pericardial effusion on bedside ultrasound.  She is feeling a lot better with no significant intervention and I think is stable for discharge home with return precautions.        Final Clinical Impression(s) / ED Diagnoses Final diagnoses:  Nonspecific chest pain    Rx / DC Orders ED Discharge Orders          Ordered    Ambulatory referral to Cardiology       Comments: If you have not heard from the Cardiology office within the next 72 hours please call [phone].   11/03/23 0981              Pricilla Loveless, MD 11/03/23 [phone]

## 2023-11-03 NOTE — Discharge Instructions (Addendum)
Call your cardiology group on Monday, 1/27.  If you develop new or worsening chest pain, shortness of breath, fevers, weakness, dizziness, or any other new/concerning symptoms then return to the ER or call 911.

## 2023-11-08 DIAGNOSIS — Z6827 Body mass index (BMI) 27.0-27.9, adult: Secondary | ICD-10-CM | POA: Diagnosis not present

## 2023-11-08 DIAGNOSIS — K5909 Other constipation: Secondary | ICD-10-CM | POA: Diagnosis not present

## 2023-11-18 ENCOUNTER — Other Ambulatory Visit: Payer: Self-pay | Admitting: Cardiology

## 2023-11-18 ENCOUNTER — Other Ambulatory Visit: Payer: Self-pay | Admitting: Urology

## 2023-11-18 DIAGNOSIS — N3281 Overactive bladder: Secondary | ICD-10-CM

## 2023-11-19 NOTE — Progress Notes (Deleted)
 GI Office Note    Referring Provider: Lianne Moris, PA-C Primary Care Physician:  Lianne Moris, PA-C  Primary Gastroenterologist: Gerrit Friends.Rourk, MD  Chief Complaint   No chief complaint on file.   History of Present Illness   Ryiah R Vanduyn is a 73 y.o. female presenting today at the request of Lianne Moris, Cordelia Poche for ***  ED visit 11/03/23 for chest pain.     Today:    Wt Readings from Last 3 Encounters:  11/03/23 172 lb 13.5 oz (78.4 kg)  10/26/23 173 lb (78.5 kg)  07/06/23 177 lb 12.8 oz (80.6 kg)    Current Outpatient Medications  Medication Sig Dispense Refill   acetaminophen (TYLENOL) 500 MG tablet Take 1,000 mg by mouth at bedtime.     Calcium Carb-Cholecalciferol (CALCIUM 600 + D PO) Take 1 tablet by mouth 2 (two) times daily.     calcium carbonate (TUMS EX) 750 MG chewable tablet Chew 2 tablets by mouth daily as needed for heartburn.     diltiazem (CARDIZEM CD) 240 MG 24 hr capsule TAKE ONE CAPSULE BY MOUTH DAILY 90 capsule 1   diltiazem (CARDIZEM) 30 MG tablet Take 1 tablet (30 mg total) by mouth as needed. 30 tablet 1   ELIQUIS 5 MG TABS tablet TAKE 1 TABLET BY MOUTH TWICE DAILY 60 tablet 5   GEMTESA 75 MG TABS TAKE 1 TABLET BY MOUTH DAILY 30 tablet 6   Multiple Vitamins-Minerals (ADULT GUMMY PO) Take 2 capsules by mouth daily.     Multiple Vitamins-Minerals (HAIR SKIN & NAILS) TABS Take 2 tablets by mouth daily.     pantoprazole (PROTONIX) 20 MG tablet TAKE 1 TABLET DAILY 90 tablet 3   simvastatin (ZOCOR) 10 MG tablet TAKE 1 TABLET DAILY 90 tablet 3   traZODone (DESYREL) 100 MG tablet Take 50 mg by mouth at bedtime.     No current facility-administered medications for this visit.    Past Medical History:  Diagnosis Date   Abnormal stress test    GXT 9/13, cath revealed normal coronary arteries   Dysrhythmia    GERD (gastroesophageal reflux disease)    Pacemaker    Paroxysmal atrial fibrillation (HCC)    Raynaud's disease    Sick sinus syndrome  (HCC)    Sleep apnea     Past Surgical History:  Procedure Laterality Date   ATRIAL FIBRILLATION ABLATION N/A 10/26/2023   Procedure: ATRIAL FIBRILLATION ABLATION;  Surgeon: Maurice Small, MD;  Location: [facility] INVASIVE CV LAB;  Service: Cardiovascular;  Laterality: N/A;   BACK SURGERY     BUNIONECTOMY WITH WEIL OSTEOTOMY Right    CARDIAC CATHETERIZATION  10/10/2011   CHOLECYSTECTOMY     PACEMAKER INSERTION  05/04/2014   MDT Advisa dual chamber pacemaker implanted by Dr Graciela Husbands for SSS, atrial fibrillation post termination pauses   PERMANENT PACEMAKER INSERTION N/A 05/04/2014   Procedure: PERMANENT PACEMAKER INSERTION;  Surgeon: Duke Salvia, MD;  Location: [facility] C[facility] LAB;  Service: Cardiovascular;  Laterality: N/A;   TONSILLECTOMY      Family History  Problem Relation Age of Onset   Hypertension Other     Allergies as of 11/20/2023   (No Known Allergies)    Social History   Socioeconomic History   Marital status: Widowed    Spouse name: Not on file   Number of children: Not on file   Years of education: Not on file   Highest education level: Not on file  Occupational History   Not  on file  Tobacco Use   Smoking status: Former    Current packs/day: 0.00    Average packs/day: 1 pack/day for 35.0 years (35.0 ttl pk-yrs)    Types: Cigarettes    Start date: 10/09/1962    Quit date: 08/09/1982    Years since quitting: 41.3   Smokeless tobacco: Never  Vaping Use   Vaping status: Never Used  Substance and Sexual Activity   Alcohol use: No    Alcohol/week: 0.0 standard drinks of alcohol   Drug use: No   Sexual activity: Not on file  Other Topics Concern   Not on file  Social History Narrative   Not on file   Social Drivers of Health   Financial Resource Strain: Not on file  Food Insecurity: Not on file  Transportation Needs: Not on file  Physical Activity: Not on file  Stress: Not on file  Social Connections: Not on file  Intimate Partner Violence: Not on file      Review of Systems   Gen: Denies any fever, chills, fatigue, weight loss, lack of appetite.  CV: Denies chest pain, heart palpitations, peripheral edema, syncope.  Resp: Denies shortness of breath at rest or with exertion. Denies wheezing or cough.  GI: see HPI GU : Denies urinary burning, urinary frequency, urinary hesitancy MS: Denies joint pain, muscle weakness, cramps, or limitation of movement.  Derm: Denies rash, itching, dry skin Psych: Denies depression, anxiety, memory loss, and confusion Heme: Denies bruising, bleeding, and enlarged lymph nodes.  Physical Exam   There were no vitals taken for this visit.  General:   Alert and oriented. Pleasant and cooperative. Well-nourished and well-developed.  Head:  Normocephalic and atraumatic. Eyes:  Without icterus, sclera clear and conjunctiva pink.  Ears:  Normal auditory acuity. Mouth:  No deformity or lesions, oral mucosa pink.  Lungs:  Clear to auscultation bilaterally. No wheezes, rales, or rhonchi. No distress.  Heart:  S1, S2 present without murmurs appreciated.  Abdomen:  +BS, soft, non-tender and non-distended. No HSM noted. No guarding or rebound. No masses appreciated.  Rectal:  Deferred  Msk:  Symmetrical without gross deformities. Normal posture. Extremities:  Without edema. Neurologic:  Alert and  oriented x4;  grossly normal neurologically. Skin:  Intact without significant lesions or rashes. Psych:  Alert and cooperative. Normal mood and affect.  Assessment   Matelyn R Ewton is a 73 y.o. female with a history of *** presenting today with      PLAN   ***    Brooke Bonito, MSN, FNP-BC, AGACNP-BC [facility]

## 2023-11-20 ENCOUNTER — Ambulatory Visit: Payer: Medicare Other | Admitting: Gastroenterology

## 2023-11-20 DIAGNOSIS — R509 Fever, unspecified: Secondary | ICD-10-CM | POA: Diagnosis not present

## 2023-11-20 DIAGNOSIS — R030 Elevated blood-pressure reading, without diagnosis of hypertension: Secondary | ICD-10-CM | POA: Diagnosis not present

## 2023-11-20 DIAGNOSIS — J101 Influenza due to other identified influenza virus with other respiratory manifestations: Secondary | ICD-10-CM | POA: Diagnosis not present

## 2023-11-20 DIAGNOSIS — Z6827 Body mass index (BMI) 27.0-27.9, adult: Secondary | ICD-10-CM | POA: Diagnosis not present

## 2023-11-20 DIAGNOSIS — E663 Overweight: Secondary | ICD-10-CM | POA: Diagnosis not present

## 2023-11-20 DIAGNOSIS — R059 Cough, unspecified: Secondary | ICD-10-CM | POA: Diagnosis not present

## 2023-11-20 NOTE — Progress Notes (Signed)
Remote pacemaker transmission.

## 2023-11-23 ENCOUNTER — Ambulatory Visit (HOSPITAL_COMMUNITY): Payer: Medicare Other | Admitting: Internal Medicine

## 2023-11-28 DIAGNOSIS — R590 Localized enlarged lymph nodes: Secondary | ICD-10-CM | POA: Diagnosis not present

## 2023-11-29 ENCOUNTER — Ambulatory Visit: Payer: 59 | Admitting: Gastroenterology

## 2023-11-29 ENCOUNTER — Encounter (INDEPENDENT_AMBULATORY_CARE_PROVIDER_SITE_OTHER): Payer: Self-pay

## 2023-12-04 ENCOUNTER — Ambulatory Visit (HOSPITAL_COMMUNITY)
Admission: RE | Admit: 2023-12-04 | Discharge: 2023-12-04 | Disposition: A | Discharge status: Home / Self Care | Payer: Medicare Other | Source: Ambulatory Visit | Attending: Internal Medicine | Admitting: Internal Medicine

## 2023-12-04 VITALS — BP 110/70 | HR 68 | Ht 69.0 in | Wt 181.6 lb

## 2023-12-04 DIAGNOSIS — Z7901 Long term (current) use of anticoagulants: Secondary | ICD-10-CM | POA: Insufficient documentation

## 2023-12-04 DIAGNOSIS — Z79899 Other long term (current) drug therapy: Secondary | ICD-10-CM | POA: Diagnosis not present

## 2023-12-04 DIAGNOSIS — I480 Paroxysmal atrial fibrillation: Secondary | ICD-10-CM

## 2023-12-04 NOTE — Progress Notes (Signed)
 Primary Care Physician: Lianne Moris, PA-C Primary Cardiologist: Nona Dell, MD Electrophysiologist: Maurice Small, MD     Referring Physician: Dr. Earley Favor Tiffany Melton is a 73 y.o. female with a history of sinus node dysfunction s/p PPM and paroxysmal atrial fibrillation who presents for consultation in the [facility]. Patient is on Eliquis 5 mg BID for a CHADS2VASC score of 2.  On evaluation today, patient is currently in NSR. S/p Afib ablation on 10/26/23 by Dr. Nelly Laurence. No episodes of Afib since ablation. She went to ED on 11/03/23 for chest pain but negative workup. No chest pain since ED visit. No SOB. Leg sites healed without issue. No missed doses of Eliquis 5 mg BID.   Today, she denies symptoms of orthopnea, PND, lower extremity edema, dizziness, presyncope, syncope, snoring, daytime somnolence, bleeding, or neurologic sequela. The patient is tolerating medications without difficulties and is otherwise without complaint today.    she has a BMI of Body mass index is 26.82 kg/m.Marland Kitchen Filed Weights   12/04/23 1112  Weight: 82.4 kg    Current Outpatient Medications  Medication Sig Dispense Refill   acetaminophen (TYLENOL) 500 MG tablet Take 1,000 mg by mouth at bedtime.     albuterol (VENTOLIN HFA) 108 (90 Base) MCG/ACT inhaler Inhale 2 puffs into the lungs every 6 (six) hours as needed.     Calcium Carb-Cholecalciferol (CALCIUM 600 + D PO) Take 1 tablet by mouth 2 (two) times daily.     calcium carbonate (TUMS EX) 750 MG chewable tablet Chew 2 tablets by mouth daily as needed for heartburn.     diltiazem (CARDIZEM CD) 240 MG 24 hr capsule TAKE ONE CAPSULE BY MOUTH DAILY 90 capsule 1   diltiazem (CARDIZEM) 30 MG tablet Take 1 tablet (30 mg total) by mouth as needed. 30 tablet 1   ELIQUIS 5 MG TABS tablet TAKE 1 TABLET BY MOUTH TWICE DAILY 60 tablet 5   GEMTESA 75 MG TABS TAKE 1 TABLET BY MOUTH DAILY 30 tablet 6   Multiple  Vitamins-Minerals (ADULT GUMMY PO) Taking 2 gummie's by mouth daily     Multiple Vitamins-Minerals (HAIR SKIN & NAILS) TABS Take 2 tablets by mouth daily.     pantoprazole (PROTONIX) 20 MG tablet TAKE 1 TABLET DAILY 90 tablet 3   simvastatin (ZOCOR) 10 MG tablet TAKE 1 TABLET DAILY 90 tablet 3   traZODone (DESYREL) 100 MG tablet Take 50 mg by mouth at bedtime.     No current facility-administered medications for this encounter.    Atrial Fibrillation Management history:  Previous antiarrhythmic drugs: none Previous cardioversions: none Previous ablations: 10/26/23 Anticoagulation history: Eliquis   ROS- All systems are reviewed and negative except as per the HPI above.  Physical Exam: BP 110/70   Pulse 68   Ht 5\' 9"  (1.753 m)   Wt 82.4 kg   BMI 26.82 kg/m   GEN: Well nourished, well developed in no acute distress NECK: No JVD; No carotid bruits CARDIAC: Regular rate and rhythm, no murmurs, rubs, gallops RESPIRATORY:  Clear to auscultation without rales, wheezing or rhonchi  ABDOMEN: Soft, non-tender, non-distended EXTREMITIES:  No edema; No deformity   EKG today demonstrates  Vent. rate 68 BPM PR interval 150 ms QRS duration 86 ms QT/QTcB 394/418 ms P-R-T axes 96 99 82  Suspect arm lead reversal, interpretation assumes no reversal Atrial-paced rhythm Rightward axis Abnormal ECG When compared with ECG of 03-Nov-2023 14:40, PREVIOUS ECG IS  PRESENT  Echo 02/06/19 demonstrated  1. The left ventricle has hyperdynamic systolic function, with an  ejection fraction of >65%. The cavity size was normal. Left ventricular  diastolic parameters were normal. No evidence of left ventricular regional  wall motion abnormalities.   2. The right ventricle has normal systolic function. The cavity was  normal. There is no increase in right ventricular wall thickness. Right  ventricular systolic pressure normal with an estimated pressure of 22.9  mmHg.   3. The pericardial effusion is  posterior to the left ventricle.   4. Trivial pericardial effusion is present.   5. The aortic valve is tricuspid. Mild calcification of the aortic valve.  Mild aortic annular calcification noted.   6. The mitral valve is grossly normal. Mild thickening of the mitral  valve leaflet.   7. The tricuspid valve is grossly normal.   8. The aortic root is normal in size and structure.    ASSESSMENT & PLAN CHA2DS2-VASc Score = 2  The patient's score is based upon: CHF History: 0 HTN History: 0 Diabetes History: 0 Stroke History: 0 Vascular Disease History: 0 Age Score: 1 Gender Score: 1       ASSESSMENT AND PLAN: Paroxysmal Atrial Fibrillation (ICD10:  I48.0) The patient's CHA2DS2-VASc score is 2, indicating a 2.2% annual risk of stroke.   S/p Afib ablation on 10/26/23 by Dr. Nelly Laurence.  She is currently in NSR.  Continue diltiazem 240 mg daily. Continue Eliquis 5 mg BID.     Follow up as scheduled with EP.    Lake Bells, PA-C  Af[facility] [address] [place], [place] [address] [phone]

## 2023-12-11 NOTE — Progress Notes (Unsigned)
 GI Office Note    Referring Provider: Lianne Moris, PA-C Primary Care Physician:  Lianne Moris, PA-C  Primary Gastroenterologist:  Chief Complaint   No chief complaint on file.    History of Present Illness   Tiffany Melton is a 73 y.o. female presenting today request of Lianne Moris, PA-C for worsening constipation, question if needs colonoscopy.  Currently takes laxatives on a regular basis recently not working as well.  Recently got relief with MiraLAX 10 capfuls.  Last colonoscopy in 2017 was normal, per PCP.  Recent A-fib ablation.  Seen in ED January 25 with chest pain.  Unremarkable workup except for mildly elevated troponins.  Seen back in the A-fib clinic on February 25.   Medications   Current Outpatient Medications  Medication Sig Dispense Refill   acetaminophen (TYLENOL) 500 MG tablet Take 1,000 mg by mouth at bedtime.     albuterol (VENTOLIN HFA) 108 (90 Base) MCG/ACT inhaler Inhale 2 puffs into the lungs every 6 (six) hours as needed.     Calcium Carb-Cholecalciferol (CALCIUM 600 + D PO) Take 1 tablet by mouth 2 (two) times daily.     calcium carbonate (TUMS EX) 750 MG chewable tablet Chew 2 tablets by mouth daily as needed for heartburn.     diltiazem (CARDIZEM CD) 240 MG 24 hr capsule TAKE ONE CAPSULE BY MOUTH DAILY 90 capsule 1   diltiazem (CARDIZEM) 30 MG tablet Take 1 tablet (30 mg total) by mouth as needed. 30 tablet 1   ELIQUIS 5 MG TABS tablet TAKE 1 TABLET BY MOUTH TWICE DAILY 60 tablet 5   GEMTESA 75 MG TABS TAKE 1 TABLET BY MOUTH DAILY 30 tablet 6   Multiple Vitamins-Minerals (ADULT GUMMY PO) Taking 2 gummie's by mouth daily     Multiple Vitamins-Minerals (HAIR SKIN & NAILS) TABS Take 2 tablets by mouth daily.     pantoprazole (PROTONIX) 20 MG tablet TAKE 1 TABLET DAILY 90 tablet 3   simvastatin (ZOCOR) 10 MG tablet TAKE 1 TABLET DAILY 90 tablet 3   traZODone (DESYREL) 100 MG tablet Take 50 mg by mouth at bedtime.     No current  facility-administered medications for this visit.    Allergies   Allergies as of 12/12/2023   (No Known Allergies)    Past Medical History   Past Medical History:  Diagnosis Date   Abnormal stress test    GXT 9/13, cath revealed normal coronary arteries   Dysrhythmia    GERD (gastroesophageal reflux disease)    Pacemaker    Paroxysmal atrial fibrillation (HCC)    Raynaud's disease    Sick sinus syndrome (HCC)    Sleep apnea     Past Surgical History   Past Surgical History:  Procedure Laterality Date   ATRIAL FIBRILLATION ABLATION N/A 10/26/2023   Procedure: ATRIAL FIBRILLATION ABLATION;  Surgeon: Maurice Small, MD;  Location: [facility] INVASIVE CV LAB;  Service: Cardiovascular;  Laterality: N/A;   BACK SURGERY     BUNIONECTOMY WITH WEIL OSTEOTOMY Right    CARDIAC CATHETERIZATION  10/10/2011   CHOLECYSTECTOMY     PACEMAKER INSERTION  05/04/2014   MDT Advisa dual chamber pacemaker implanted by Dr Graciela Husbands for SSS, atrial fibrillation post termination pauses   PERMANENT PACEMAKER INSERTION N/A 05/04/2014   Procedure: PERMANENT PACEMAKER INSERTION;  Surgeon: Duke Salvia, MD;  Location: [facility] CATH LAB;  Service: Cardiovascular;  Laterality: N/A;   TONSILLECTOMY      Past Family History   Family  History  Problem Relation Age of Onset   Hypertension Other     Past Social History   Social History   Socioeconomic History   Marital status: Widowed    Spouse name: Not on file   Number of children: Not on file   Years of education: Not on file   Highest education level: Not on file  Occupational History   Not on file  Tobacco Use   Smoking status: Former    Current packs/day: 0.00    Average packs/day: 1 pack/day for 35.0 years (35.0 ttl pk-yrs)    Types: Cigarettes    Start date: 10/09/1962    Quit date: 08/09/1982    Years since quitting: 41.3   Smokeless tobacco: Never  Vaping Use   Vaping status: Never Used  Substance and Sexual Activity   Alcohol use: No     Alcohol/week: 0.0 standard drinks of alcohol   Drug use: No   Sexual activity: Not on file  Other Topics Concern   Not on file  Social History Narrative   Not on file   Social Drivers of Health   Financial Resource Strain: Not on file  Food Insecurity: Not on file  Transportation Needs: Not on file  Physical Activity: Not on file  Stress: Not on file  Social Connections: Not on file  Intimate Partner Violence: Not on file    Review of Systems   General: Negative for anorexia, weight loss, fever, chills, fatigue, weakness. Eyes: Negative for vision changes.  ENT: Negative for hoarseness, difficulty swallowing , nasal congestion. CV: Negative for chest pain, angina, palpitations, dyspnea on exertion, peripheral edema.  Respiratory: Negative for dyspnea at rest, dyspnea on exertion, cough, sputum, wheezing.  GI: See history of present illness. GU:  Negative for dysuria, hematuria, urinary incontinence, urinary frequency, nocturnal urination.  MS: Negative for joint pain, low back pain.  Derm: Negative for rash or itching.  Neuro: Negative for weakness, abnormal sensation, seizure, frequent headaches, memory loss,  confusion.  Psych: Negative for anxiety, depression, suicidal ideation, hallucinations.  Endo: Negative for unusual weight change.  Heme: Negative for bruising or bleeding. Allergy: Negative for rash or hives.  Physical Exam   There were no vitals taken for this visit.   General: Well-nourished, well-developed in no acute distress.  Head: Normocephalic, atraumatic.   Eyes: Conjunctiva pink, no icterus. Mouth: Oropharyngeal mucosa moist and pink , no lesions erythema or exudate. Neck: Supple without thyromegaly, masses, or lymphadenopathy.  Lungs: Clear to auscultation bilaterally.  Heart: Regular rate and rhythm, no murmurs rubs or gallops.  Abdomen: Bowel sounds are normal, nontender, nondistended, no hepatosplenomegaly or masses,  no abdominal bruits or  hernia, no rebound or guarding.   Rectal: *** Extremities: No lower extremity edema. No clubbing or deformities.  Neuro: Alert and oriented x 4 , grossly normal neurologically.  Skin: Warm and dry, no rash or jaundice.   Psych: Alert and cooperative, normal mood and affect.  Labs   Lab Results  Component Value Date   NA 141 11/03/2023   CL 102 11/03/2023   K 4.3 11/03/2023   CO2 28 11/03/2023   BUN 15 11/03/2023   CREATININE 0.75 11/03/2023   GFRNONAA >60 11/03/2023   CALCIUM 9.4 11/03/2023   ALBUMIN 3.3 (L) 05/02/2014   GLUCOSE 129 (H) 11/03/2023   Lab Results  Component Value Date   WBC 5.7 11/03/2023   HGB 12.2 11/03/2023   HCT 38.1 11/03/2023   MCV 100.8 (H) 11/03/2023  PLT 210 11/03/2023     Imaging Studies   No results found.  Assessment       PLAN   ***   Leanna Battles. Melvyn Neth, MHS, PA-C [facility]

## 2023-12-12 ENCOUNTER — Telehealth: Payer: Self-pay | Admitting: *Deleted

## 2023-12-12 ENCOUNTER — Ambulatory Visit (INDEPENDENT_AMBULATORY_CARE_PROVIDER_SITE_OTHER): Payer: Medicare Other | Admitting: Gastroenterology

## 2023-12-12 ENCOUNTER — Encounter: Payer: Self-pay | Admitting: Gastroenterology

## 2023-12-12 VITALS — BP 109/59 | HR 81 | Temp 97.7°F | Ht 68.0 in | Wt 184.0 lb

## 2023-12-12 DIAGNOSIS — K5900 Constipation, unspecified: Secondary | ICD-10-CM | POA: Diagnosis not present

## 2023-12-12 DIAGNOSIS — K219 Gastro-esophageal reflux disease without esophagitis: Secondary | ICD-10-CM | POA: Diagnosis not present

## 2023-12-12 DIAGNOSIS — R159 Full incontinence of feces: Secondary | ICD-10-CM | POA: Diagnosis not present

## 2023-12-12 MED ORDER — TRULANCE 3 MG PO TABS: 3.0000 mg | ORAL_TABLET | Freq: Every day | ORAL | 5 refills | Status: DC

## 2023-12-12 NOTE — Telephone Encounter (Signed)
  Request for patient to stop medication prior to procedure or is needing cleareance  12/12/23  Tiffany Melton 1951-07-30  What type of surgery is being performed? Colonoscopy  When is surgery scheduled? TBD  What type of clearance is required (medical or pharmacy to hold medication or both? Both  Patient is needing to have cardiac clearance due to recent Afib ablation and needs to hold Eliquis x 2 days.  Name of physician performing surgery?  Dr.Rourk [facility] [employer]: [phone] Fax: [phone]  Anethesia type (none, local, MAC, general)? MAC

## 2023-12-12 NOTE — Patient Instructions (Addendum)
 Start Trulance 3mg  daily for constipation. Samples provided. RX sent to pharmacy. Let me know if not affordable. Let me know if your bowels are not moving at least every other day. We will work on getting you scheduled for a colonoscopy in April. Due to your recent Afib ablation, we will need to get cardiac clearance and clearance to hold Eliquis for 48 hours before your colonoscopy. Once you have been cleared we will get your colonoscopy scheduled.

## 2023-12-12 NOTE — Telephone Encounter (Signed)
 I s/w the pt and she is aware cannot schedule colonoscopy before 01/24/24 due to ablation 10/26/23. Pt said she would like to see Dr. Diona Browner in office instead of doing a tele preop appt. Pt has been scheduled to see Dr. Diona Browner in [facility] 02/04/24 @ 10:40. Pt asked if this will be for her yrly f/u as well. I assured the pt that I have noted the appt notes: PREOP CLEARANCE AND YRLY F/U. Pt thanked me for the help. I will update all parties involved.

## 2023-12-12 NOTE — Telephone Encounter (Signed)
 Patient with diagnosis of afib on Eliquis for anticoagulation.    Procedure: Colonoscopy  Date of procedure: TBD ( can not be before 01/24/24)   CHA2DS2-VASc Score = 2   This indicates a 2.2% annual risk of stroke. The patient's score is based upon: CHF History: 0 HTN History: 0 Diabetes History: 0 Stroke History: 0 Vascular Disease History: 0 Age Score: 1 Gender Score: 1       CrCl 88 mL/min Platelet count 210 K  Ablation done on 10/26/2023 uninterrupted DOAC treatment suggested for at least 3 months post ablation so ok to hold any time after 01/24/2024 Per office protocol, patient can hold Eliquis for 2 days prior to procedure.     **This guidance is not considered finalized until pre-operative APP has relayed final recommendations.**

## 2023-12-12 NOTE — Telephone Encounter (Signed)
   Name: Tiffany Melton  DOB: [birth year]  MRN: [id number]  Primary Cardiologist: Nona Dell, MD   Preoperative team, please contact this patient and set up a phone call appointment for further preoperative risk assessment. Please obtain consent and complete medication review. Thank you for your help.  I confirm that guidance regarding antiplatelet and oral anticoagulation therapy has been completed and, if necessary, noted below.  Ablation done on 10/26/2023 uninterrupted DOAC treatment suggested for at least 3 months post ablation so ok to hold any time after 01/24/2024 Per office protocol, patient can hold Eliquis for 2 days prior to procedure.    I also confirmed the patient resides in the state of [place]. As per [facility] Medical Board telemedicine laws, the patient must reside in the state in which the provider is licensed.   Denyce Robert, NP 12/12/2023, 2:19 PM [facility]

## 2023-12-13 ENCOUNTER — Encounter: Payer: Self-pay | Admitting: Gastroenterology

## 2023-12-14 NOTE — Telephone Encounter (Signed)
 noted

## 2023-12-14 NOTE — Telephone Encounter (Signed)
 Noted. Can you keep her on file to schedule after getting cardiac clearance?

## 2023-12-19 ENCOUNTER — Other Ambulatory Visit: Payer: Self-pay | Admitting: Cardiology

## 2023-12-19 NOTE — Telephone Encounter (Signed)
 Prescription refill request for Eliquis received. Indication:afib Last office visit:2/25 Scr:0.75  1/25 Age: 73 Weight:83.5  kg  Prescription refilled

## 2023-12-27 DIAGNOSIS — Z1231 Encounter for screening mammogram for malignant neoplasm of breast: Secondary | ICD-10-CM | POA: Diagnosis not present

## 2024-01-08 ENCOUNTER — Ambulatory Visit (INDEPENDENT_AMBULATORY_CARE_PROVIDER_SITE_OTHER): Payer: Medicare Other

## 2024-01-08 DIAGNOSIS — I495 Sick sinus syndrome: Secondary | ICD-10-CM | POA: Diagnosis not present

## 2024-01-10 LAB — CUP PACEART REMOTE DEVICE CHECK
Battery Remaining Longevity: 9 mo
Battery Voltage: 2.87 V
Brady Statistic AP VP Percent: 1.18 %
Brady Statistic AP VS Percent: 77.98 %
Brady Statistic AS VP Percent: 0.14 %
Brady Statistic AS VS Percent: 20.7 %
Brady Statistic RA Percent Paced: 77.68 %
Brady Statistic RV Percent Paced: 1.44 %
Date Time Interrogation Session: 20250402171923
Implantable Lead Connection Status: 753985
Implantable Lead Connection Status: 753985
Implantable Lead Implant Date: 20150727
Implantable Lead Implant Date: 20150727
Implantable Lead Location: 753859
Implantable Lead Location: 753860
Implantable Lead Model: 5076
Implantable Lead Model: 5076
Implantable Pulse Generator Implant Date: 20150727
Lead Channel Impedance Value: 361 Ohm
Lead Channel Impedance Value: 437 Ohm
Lead Channel Impedance Value: 494 Ohm
Lead Channel Impedance Value: 494 Ohm
Lead Channel Pacing Threshold Amplitude: 0.75 V
Lead Channel Pacing Threshold Amplitude: 2.375 V
Lead Channel Pacing Threshold Pulse Width: 0.4 ms
Lead Channel Pacing Threshold Pulse Width: 0.4 ms
Lead Channel Sensing Intrinsic Amplitude: 3.125 mV
Lead Channel Sensing Intrinsic Amplitude: 3.125 mV
Lead Channel Sensing Intrinsic Amplitude: 3.375 mV
Lead Channel Sensing Intrinsic Amplitude: 3.375 mV
Lead Channel Setting Pacing Amplitude: 2 V
Lead Channel Setting Pacing Amplitude: 3.5 V
Lead Channel Setting Pacing Pulse Width: 1 ms
Lead Channel Setting Sensing Sensitivity: 2 mV
Zone Setting Status: 755011
Zone Setting Status: 755011

## 2024-01-17 ENCOUNTER — Encounter: Payer: Self-pay | Admitting: Cardiovascular Disease

## 2024-02-04 ENCOUNTER — Encounter: Payer: Self-pay | Admitting: Cardiology

## 2024-02-04 ENCOUNTER — Ambulatory Visit: Attending: Cardiology | Admitting: Cardiology

## 2024-02-04 VITALS — BP 122/78 | HR 68 | Ht 68.0 in | Wt 182.4 lb

## 2024-02-04 DIAGNOSIS — R931 Abnormal findings on diagnostic imaging of heart and coronary circulation: Secondary | ICD-10-CM | POA: Insufficient documentation

## 2024-02-04 DIAGNOSIS — I495 Sick sinus syndrome: Secondary | ICD-10-CM | POA: Diagnosis not present

## 2024-02-04 DIAGNOSIS — I480 Paroxysmal atrial fibrillation: Secondary | ICD-10-CM | POA: Diagnosis not present

## 2024-02-04 MED ORDER — SIMVASTATIN 20 MG PO TABS: 20.0000 mg | ORAL_TABLET | Freq: Every day | ORAL | 3 refills | Status: AC

## 2024-02-04 NOTE — Patient Instructions (Addendum)
 Medication Instructions:  Your physician has recommended you make the following change in your medication:  Increase simvastatin  to 20 mg daily Continue all other medications as prescribed  Labwork: none  Testing/Procedures: none  Follow-Up: Your physician recommends that you schedule a follow-up appointment in: 6 months  Any Other Special Instructions Will Be Listed Below (If Applicable).  If you need a refill on your cardiac medications before your next appointment, please call your pharmacy.

## 2024-02-04 NOTE — Progress Notes (Signed)
    Cardiology Office Note  Date: 02/04/2024   ID: MONROE PRUCHA, DOB Nov 29, 1950, MRN [id number]  History of Present Illness: Tiffany Melton is a 73 y.o. female that I last saw in May 2024.  She has had subsequent follow-up with Dr. Arlester Ladd and in the atrial fibrillation clinic, I reviewed interval records.  She is here for a routine visit.  States that she has done very well, no obvious palpitations or breakthrough atrial fibrillation since her ablation in January.  Medtronic pacemaker in place with follow-up by Dr. Arlester Ladd.  Device interrogation in April demonstrated normal function.  We went over her medications.  She reports compliance with therapy.  We did discuss increasing her Zocor  to 20 mg daily if tolerated given calcium score of 81.  I reviewed her ECG today which shows an atrial paced rhythm.  She is to undergo colonoscopy with Dr. Riley Cheadle, now three months out from atrial fibrillation ablation in January and can hold Eliquis  temporarily for 48 hours prior to procedure.  Physical Exam: VS:  BP 122/78   Pulse 68   Ht 5\' 8"  (1.727 m)   Wt 182 lb 6.4 oz (82.7 kg)   SpO2 96%   BMI 27.73 kg/m , BMI Body mass index is 27.73 kg/m.  Wt Readings from Last 3 Encounters:  02/04/24 182 lb 6.4 oz (82.7 kg)  12/12/23 184 lb (83.5 kg)  12/04/23 181 lb 9.6 oz (82.4 kg)    General: Patient appears comfortable at rest. HEENT: Conjunctiva and lids normal. Neck: Supple, no elevated JVP or carotid bruits. Lungs: Clear to auscultation, nonlabored breathing at rest. Cardiac: Regular rate and rhythm, no S3 or significant systolic murmur.  ECG:  An ECG dated 12/04/2023 was personally reviewed today and demonstrated:  Atrial paced rhythm.  Labwork: August 2024: Cholesterol 176, triglycerides 91, HDL 71, LDL 89 11/03/2023: B Natriuretic Peptide 93.0; BUN 15; Creatinine, Ser 0.75; Hemoglobin 12.2; Platelets 210; Potassium 4.3; Sodium 141   Other Studies Reviewed Today:  No interval cardiac  testing for review today.  Assessment and Plan:  1.  Paroxysmal atrial fibrillation with CHA2DS2-VASc score of 2.  She underwent atrial fibrillation ablation with Dr. Arlester Ladd in January.  Continues to do well at this point.  She is on Eliquis  5 mg twice daily, Cardizem  CD 240 mg daily, and as needed short acting Cardizem  30 mg.  ECG reviewed.  No changes made today.   2.  Sick sinus syndrome with Medtronic pacemaker in place and followed by Dr. Arlester Ladd.  3.  Coronary calcium score of 81 in December 2024.  Plan to increase Zocor  to 20 mg daily if tolerated.  Last lipid panel showed HDL 71 and LDL 89.  Would ideally like to get her LDL at least under 70.  4.  Patient pending colonoscopy with Dr. Riley Cheadle.  She is now 3 months out from atrial fibrillation ablation in January and can hold Eliquis  48 hours prior to procedure.  Disposition:  Follow up  6 months.  Signed, Gerard Knight, M.D., F.A.C.[facility]

## 2024-02-05 ENCOUNTER — Telehealth: Payer: Self-pay | Admitting: Urology

## 2024-02-05 NOTE — Telephone Encounter (Signed)
 Patient is made aware Gemtesa  75 mg has been approved through her Insurance. Patient voiced understanding

## 2024-02-05 NOTE — Telephone Encounter (Signed)
 Patient states she called last week about the prior authorization for her Gemtesa  , she has not received a response back and she is out of medication .

## 2024-02-05 NOTE — Telephone Encounter (Signed)
 Patient called about prior authorization. Patient is made aware PA will be done. Spoke to Cat at [facility] regarding [employer] on cover my meds. Cat explain that patient primary could not be used due to patient Medicare part A and B do not cover patient prescription and secondary insurance had to be used to cover patient RX. Voiced understanding.   Medication prior authorization request received.  Completed PA request through cover my meds for drug Gemtesa  75mg . KEY: [id number]  Approved: Approved

## 2024-02-06 NOTE — Telephone Encounter (Signed)
 Pt was seen by cardiology on 02/04/24. We can get her scheduled for June when we get Dr.Rourks schedule.

## 2024-02-07 NOTE — Telephone Encounter (Signed)
 Ok to schedule. Will need to hold eliquis  48 hours before.  Did I give instructions for special prep due to constipation? We will need to find out from patient if her Trulance  is controlling her constipation. How often is she having a BM?

## 2024-02-07 NOTE — Telephone Encounter (Signed)
 To do Bisacodyl 10 mg daily x 3 days before prep

## 2024-02-08 ENCOUNTER — Encounter: Payer: Self-pay | Admitting: Cardiovascular Disease

## 2024-02-08 ENCOUNTER — Ambulatory Visit: Payer: Medicare Other | Attending: Cardiovascular Disease | Admitting: Cardiovascular Disease

## 2024-02-08 VITALS — BP 114/84 | HR 83 | Ht 69.0 in | Wt 184.4 lb

## 2024-02-08 DIAGNOSIS — I480 Paroxysmal atrial fibrillation: Secondary | ICD-10-CM | POA: Insufficient documentation

## 2024-02-08 DIAGNOSIS — I495 Sick sinus syndrome: Secondary | ICD-10-CM | POA: Insufficient documentation

## 2024-02-08 LAB — CUP PACEART INCLINIC DEVICE CHECK
Date Time Interrogation Session: 20250502164503
Implantable Lead Connection Status: 753985
Implantable Lead Connection Status: 753985
Implantable Lead Implant Date: 20150727
Implantable Lead Implant Date: 20150727
Implantable Lead Location: 753859
Implantable Lead Location: 753860
Implantable Lead Model: 5076
Implantable Lead Model: 5076
Implantable Pulse Generator Implant Date: 20150727

## 2024-02-08 NOTE — Telephone Encounter (Signed)
 LMTRC

## 2024-02-08 NOTE — Progress Notes (Signed)
    PCP: Fredick Jarred, PA-C Primary Cardiologist: Dr Londa Rival Primary EP:  Dr Arlester Ladd  Tiffany Melton is a 73 y.o. female who presents today for routine electrophysiology followup.  Since last being seen in our clinic, the patient reports doing reasonably well.    She has paroxysms of rapid palpitations that are very disconcerting to her.  These occur fairly infrequently but medically affect her quality of life. Otherwise, not had any chest pain, presyncope, syncope.  She underwent atrial fibrillation ablation on October 26, 2023.   She reports that she is doing well. She is still having occasional episodes of palpitations.     Physical Exam:  Gen: Appears comfortable, well-nourished CV: RRR, no dependent edema The device site is normal - no tenderness, edema, drainage, redness, threatened erosion. Pulm: breathing easily   Pacemaker interrogation- reviewed in detail today,  See PACEART report   Assessment and Plan:  Symptomatic sinus bradycardia  Normal pacemaker function See Pace Art report No changes today she is not device dependant today RV threshold is chronically elevated.  She does not RV pace and therefore would not require lead revision.  Battery life is about 7 months.  We discussed the battery change including risk of infection, bleeding, damage to existing existing leads.  She will not need additional follow-up prior to scheduling generator change.  Paroxysmal atrial fibrillation and likely atrial flutter Maintaining sinus rhythm after ablation on October 26, 2023 Continue eliquis     overweight Body mass index is 27.23 kg/m. Lifestyle change advised Fasting lipids ordered  Risks, benefits and potential toxicities for medications prescribed and/or refilled reviewed with patient today.    Efraim Grange, MD 02/08/2024 3:19 PM

## 2024-02-08 NOTE — Telephone Encounter (Signed)
 Ok. She can do Trulance  with 2 stool softener with stimulants. Do that until the day of her prep.  Do not do bisacodyl daily for 3 days before since she is already using stimulate daily.

## 2024-02-08 NOTE — Telephone Encounter (Signed)
 Ok find out if she is having more regular BMs on Trulance . If so, prep as ordered. If not, let me know.

## 2024-02-08 NOTE — Telephone Encounter (Signed)
 Pt says she is not having a full bm with just the Trulance . She says she will go just a little bit everyday. She has been using the Trulance  with 2 stool softener with stimulant and that really works, but just using the Trulance  just goes a little bit.

## 2024-02-08 NOTE — Patient Instructions (Addendum)

## 2024-02-08 NOTE — Telephone Encounter (Signed)
 Noted. Will schedule pt once get providers schedule for June.

## 2024-02-11 ENCOUNTER — Encounter: Payer: Self-pay | Admitting: *Deleted

## 2024-02-11 ENCOUNTER — Other Ambulatory Visit: Payer: Self-pay | Admitting: *Deleted

## 2024-02-11 ENCOUNTER — Other Ambulatory Visit: Payer: Self-pay | Admitting: Internal Medicine

## 2024-02-11 MED ORDER — SUFLAVE 178.7 G PO SOLR
1.0000 | Freq: Once | ORAL | 0 refills | Status: DC
Start: 2024-02-11 — End: 2024-02-12

## 2024-02-11 MED ORDER — TRULANCE 3 MG PO TABS: 3.0000 mg | ORAL_TABLET | Freq: Every day | ORAL | 5 refills | Status: DC

## 2024-02-11 NOTE — Telephone Encounter (Signed)
 Pt asked that refills for Trulance  to sent to [facility]. She has been scheduled for 04/02/24 with Dr.Rourk. instructions mailed and prep sent to the pharmacy.

## 2024-02-11 NOTE — Addendum Note (Signed)
 Addended by: Lanney Pitts on: 02/11/2024 01:36 PM   Modules accepted: Orders

## 2024-02-11 NOTE — Telephone Encounter (Signed)
 Rx sent.

## 2024-02-12 ENCOUNTER — Encounter: Payer: Self-pay | Admitting: *Deleted

## 2024-02-12 ENCOUNTER — Other Ambulatory Visit: Payer: Self-pay | Admitting: *Deleted

## 2024-02-12 MED ORDER — CLENPIQ 10-3.5-12 MG-GM -GM/175ML PO SOLN: 1.0000 | ORAL | 0 refills | Status: DC

## 2024-02-12 NOTE — Telephone Encounter (Signed)
 Clenpiq sent to pharmacy. New instructions mailed to pt

## 2024-02-12 NOTE — Addendum Note (Signed)
 Addended by: Alvester Johnson on: 02/12/2024 11:53 AM   Modules accepted: Orders

## 2024-02-19 ENCOUNTER — Ambulatory Visit: Payer: Self-pay | Admitting: Cardiovascular Disease

## 2024-02-21 NOTE — Progress Notes (Signed)
 Remote pacemaker transmission.

## 2024-02-21 NOTE — Addendum Note (Signed)
 Addended by: Lott Rouleau A on: 02/21/2024 09:11 AM   Modules accepted: Orders

## 2024-03-26 ENCOUNTER — Telehealth: Payer: Self-pay

## 2024-03-26 MED ORDER — LINACLOTIDE 290 MCG PO CAPS: 290.0000 ug | ORAL_CAPSULE | Freq: Every day | ORAL | 5 refills | Status: DC

## 2024-03-26 NOTE — Addendum Note (Signed)
 Addended by: Lanney Pitts on: 03/26/2024 04:50 PM   Modules accepted: Orders

## 2024-03-26 NOTE — Telephone Encounter (Signed)
 Linzess 290mcg daily on empty stomach sent to pharmacy. Stop trulance .

## 2024-03-26 NOTE — Telephone Encounter (Signed)
 Pt called stating that the Trulance  is not working and has been taking a laxative with it and still not having productive bm's. Pt is wanting to know if she can have an Rx for Linzess called in to try it.

## 2024-03-27 NOTE — Telephone Encounter (Signed)
 Pt was made aware and verbalized understanding.

## 2024-03-28 ENCOUNTER — Other Ambulatory Visit: Payer: Self-pay

## 2024-03-28 ENCOUNTER — Encounter (HOSPITAL_COMMUNITY): Payer: Self-pay

## 2024-03-28 ENCOUNTER — Encounter (HOSPITAL_COMMUNITY)
Admission: RE | Admit: 2024-03-28 | Discharge: 2024-03-28 | Disposition: A | Discharge status: Home / Self Care | Source: Ambulatory Visit | Attending: Internal Medicine | Admitting: Internal Medicine

## 2024-03-28 ENCOUNTER — Encounter: Payer: Self-pay | Admitting: Cardiovascular Disease

## 2024-03-28 NOTE — Patient Instructions (Addendum)
 Tiffany Melton  03/28/2024     @PREFPERIOPPHARMACY @   Your procedure is scheduled on 04/02/2024.   Report to [facility] at  0730 A.M.   Call this number if you have problems the morning of surgery:  [phone]  If you experience any cold or flu symptoms such as cough, fever, chills, shortness of breath, etc. between now and your scheduled surgery, please notify us  at the above number.   Remember:        Your last dose of eliquis  should be on 03/30/2024.        Use your inhaler before you come and bring your rescue inhaler with you.   Follow the diet and prep instructions given to you by the office.   You may drink clear liquids until 0530 am on 04/02/2024.    Clear liquids allowed are:                    Water , Juice (No red color; non-citric and without pulp; diabetics please choose diet or no sugar options), Carbonated beverages (diabetics please choose diet or no sugar options), Clear Tea (No creamer, milk, or cream, including half & half and powdered creamer), Black Coffee Only (No creamer, milk or cream, including half & half and powdered creamer), and Clear Sports drink (No red color; diabetics please choose diet or no sugar options)    Take these medicines the morning of surgery with A SIP OF WATER                                       diltiazem , pantoprazole .    Do not wear jewelry, make-up or nail polish, including gel polish,  artificial nails, or any other type of covering on natural nails (fingers and  toes).  Do not wear lotions, powders, or perfumes, or deodorant.  Do not shave 48 hours prior to surgery.  Men may shave face and neck.  Do not bring valuables to the hospital.  [facility] is not responsible for any belongings or valuables.  Contacts, dentures or bridgework may not be worn into surgery.  Leave your suitcase in the car.  After surgery it may be brought to your room.  For patients admitted to the hospital, discharge time will be determined by  your treatment team.  Patients discharged the day of surgery will not be allowed to drive home and must have someone with them for 24 hours.    Special instructions:   DO NOT smoke tobacco or vape for 24 hours before your procedure.  Please read over the following fact sheets that you were given. Anesthesia Post-op Instructions and Care and Recovery After Surgery      Colonoscopy, Adult, Care After The following information offers guidance on how to care for yourself after your procedure. Your health care provider may also give you more specific instructions. If you have problems or questions, contact your health care provider. What can I expect after the procedure? After the procedure, it is common to have: A small amount of blood in your stool for 24 hours after the procedure. Some gas. Mild cramping or bloating of your abdomen. Follow these instructions at home: Eating and drinking  Drink enough fluid to keep your urine pale yellow. Follow instructions from your health care provider about eating or drinking restrictions. Resume your normal diet as told by your  health care provider. Avoid heavy or fried foods that are hard to digest. Activity Rest as told by your health care provider. Avoid sitting for a long time without moving. Get up to take short walks every 1-2 hours. This is important to improve blood flow and breathing. Ask for help if you feel weak or unsteady. Return to your normal activities as told by your health care provider. Ask your health care provider what activities are safe for you. Managing cramping and bloating  Try walking around when you have cramps or feel bloated. If directed, apply heat to your abdomen as told by your health care provider. Use the heat source that your health care provider recommends, such as a moist heat pack or a heating pad. Place a towel between your skin and the heat source. Leave the heat on for 20-30 minutes. Remove the heat if  your skin turns bright red. This is especially important if you are unable to feel pain, heat, or cold. You have a greater risk of getting burned. General instructions If you were given a sedative during the procedure, it can affect you for several hours. Do not drive or operate machinery until your health care provider says that it is safe. For the first 24 hours after the procedure: Do not sign important documents. Do not drink alcohol . Do your regular daily activities at a slower pace than normal. Eat soft foods that are easy to digest. Take over-the-counter and prescription medicines only as told by your health care provider. Keep all follow-up visits. This is important. Contact a health care provider if: You have blood in your stool 2-3 days after the procedure. Get help right away if: You have more than a small spotting of blood in your stool. You have large blood clots in your stool. You have swelling of your abdomen. You have nausea or vomiting. You have a fever. You have increasing pain in your abdomen that is not relieved with medicine. These symptoms may be an emergency. Get help right away. Call 911. Do not wait to see if the symptoms will go away. Do not drive yourself to the hospital. Summary After the procedure, it is common to have a small amount of blood in your stool. You may also have mild cramping and bloating of your abdomen. If you were given a sedative during the procedure, it can affect you for several hours. Do not drive or operate machinery until your health care provider says that it is safe. Get help right away if you have a lot of blood in your stool, nausea or vomiting, a fever, or increased pain in your abdomen. This information is not intended to replace advice given to you by your health care provider. Make sure you discuss any questions you have with your health care provider. Document Revised: 11/07/2022 Document Reviewed: 05/18/2021 Elsevier Patient  Education  2024 [employer]General Anesthesia, Adult, Care After The following information offers guidance on how to care for yourself after your procedure. Your health care provider may also give you more specific instructions. If you have problems or questions, contact your health care provider. What can I expect after the procedure? After the procedure, it is common for people to: Have pain or discomfort at the IV site. Have nausea or vomiting. Have a sore throat or hoarseness. Have trouble concentrating. Feel cold or chills. Feel weak, sleepy, or tired (fatigue). Have soreness and body aches. These can affect parts of the body that were not involved in  surgery. Follow these instructions at home: For the time period you were told by your health care provider:  Rest. Do not participate in activities where you could fall or become injured. Do not drive or use machinery. Do not drink alcohol . Do not take sleeping pills or medicines that cause drowsiness. Do not make important decisions or sign legal documents. Do not take care of children on your own. General instructions Drink enough fluid to keep your urine pale yellow. If you have sleep apnea, surgery and certain medicines can increase your risk for breathing problems. Follow instructions from your health care provider about wearing your sleep device: Anytime you are sleeping, including during daytime naps. While taking prescription pain medicines, sleeping medicines, or medicines that make you drowsy. Return to your normal activities as told by your health care provider. Ask your health care provider what activities are safe for you. Take over-the-counter and prescription medicines only as told by your health care provider. Do not use any products that contain nicotine or tobacco. These products include cigarettes, chewing tobacco, and vaping devices, such as e-cigarettes. These can delay incision healing after surgery. If you need  help quitting, ask your health care provider. Contact a health care provider if: You have nausea or vomiting that does not get better with medicine. You vomit every time you eat or drink. You have pain that does not get better with medicine. You cannot urinate or have bloody urine. You develop a skin rash. You have a fever. Get help right away if: You have trouble breathing. You have chest pain. You vomit blood. These symptoms may be an emergency. Get help right away. Call 911. Do not wait to see if the symptoms will go away. Do not drive yourself to the hospital. Summary After the procedure, it is common to have a sore throat, hoarseness, nausea, vomiting, or to feel weak, sleepy, or fatigue. For the time period you were told by your health care provider, do not drive or use machinery. Get help right away if you have difficulty breathing, have chest pain, or vomit blood. These symptoms may be an emergency. This information is not intended to replace advice given to you by your health care provider. Make sure you discuss any questions you have with your health care provider. Document Revised: 12/23/2021 Document Reviewed: 12/23/2021 Elsevier Patient Education  2024 [employer].

## 2024-03-28 NOTE — Progress Notes (Signed)
 PERIOPERATIVE PRESCRIPTION FOR IMPLANTED CARDIAC DEVICE PROGRAMMING  Patient Information: Name:  Tiffany Melton  DOB:  1951-03-19  MRN:  [id number]  Planned Procedure:  COLONOSCOPY  Surgeon:  Suzette Espy, MD  Date of Procedure: 04/03/2024 Position during surgery:  left lateral  Device Information:  Clinic EP Physician:  Dr. Marlane Silver  Device Type:  Pacemaker Manufacturer and Phone #:  [employer]: [phone] Pacemaker Dependent?:  No. Date of Last Device Check:  02/08/2024 Normal Device Function?:  Yes.    Electrophysiologist's Recommendations:  Have magnet available. Provide continuous ECG monitoring when magnet is used or reprogramming is to be performed.  Procedure should not interfere with device function.  No device programming or magnet placement needed.  Per Device Clinic Standing Orders, Forrestine Ike, RN  3:38 PM 03/28/2024

## 2024-04-02 ENCOUNTER — Ambulatory Visit (HOSPITAL_COMMUNITY)
Admission: RE | Admit: 2024-04-02 | Discharge: 2024-04-02 | Disposition: A | Discharge status: Home / Self Care | Attending: Internal Medicine | Admitting: Internal Medicine

## 2024-04-02 ENCOUNTER — Ambulatory Visit (HOSPITAL_COMMUNITY): Admitting: Anesthesiology

## 2024-04-02 ENCOUNTER — Encounter (HOSPITAL_COMMUNITY): Payer: Self-pay | Admitting: Internal Medicine

## 2024-04-02 ENCOUNTER — Encounter (HOSPITAL_COMMUNITY)
Admission: RE | Disposition: A | Discharge status: Home / Self Care | Payer: Self-pay | Source: Home / Self Care | Attending: Internal Medicine

## 2024-04-02 DIAGNOSIS — Z1211 Encounter for screening for malignant neoplasm of colon: Secondary | ICD-10-CM | POA: Insufficient documentation

## 2024-04-02 DIAGNOSIS — Z950 Presence of cardiac pacemaker: Secondary | ICD-10-CM | POA: Diagnosis not present

## 2024-04-02 DIAGNOSIS — Z79899 Other long term (current) drug therapy: Secondary | ICD-10-CM | POA: Insufficient documentation

## 2024-04-02 DIAGNOSIS — Z7901 Long term (current) use of anticoagulants: Secondary | ICD-10-CM | POA: Insufficient documentation

## 2024-04-02 DIAGNOSIS — Z87891 Personal history of nicotine dependence: Secondary | ICD-10-CM | POA: Insufficient documentation

## 2024-04-02 DIAGNOSIS — Q438 Other specified congenital malformations of intestine: Secondary | ICD-10-CM

## 2024-04-02 DIAGNOSIS — I10 Essential (primary) hypertension: Secondary | ICD-10-CM | POA: Diagnosis not present

## 2024-04-02 DIAGNOSIS — K6389 Other specified diseases of intestine: Secondary | ICD-10-CM | POA: Diagnosis not present

## 2024-04-02 DIAGNOSIS — K219 Gastro-esophageal reflux disease without esophagitis: Secondary | ICD-10-CM | POA: Diagnosis not present

## 2024-04-02 HISTORY — PX: COLONOSCOPY: SHX5424

## 2024-04-02 SURGERY — COLONOSCOPY
Anesthesia: General

## 2024-04-02 MED ORDER — LIDOCAINE 2% (20 MG/ML) 5 ML SYRINGE
INTRAMUSCULAR | Status: DC | PRN
  Administered 2024-04-02: 60 mg via INTRAVENOUS

## 2024-04-02 MED ORDER — VASOPRESSIN 20 UNIT/ML IV SOLN
INTRAVENOUS | Status: DC | PRN
  Administered 2024-04-02: 1 [IU] via INTRAVENOUS

## 2024-04-02 MED ORDER — PROPOFOL 500 MG/50ML IV EMUL
INTRAVENOUS | Status: DC | PRN
  Administered 2024-04-02: 125 ug/kg/min via INTRAVENOUS
  Administered 2024-04-02: 30 mg via INTRAVENOUS
  Administered 2024-04-02: 60 mg via INTRAVENOUS

## 2024-04-02 MED ORDER — LACTATED RINGERS IV SOLN: INTRAVENOUS | Status: DC

## 2024-04-02 MED ORDER — EPHEDRINE SULFATE-NACL 50-0.9 MG/10ML-% IV SOSY
PREFILLED_SYRINGE | INTRAVENOUS | Status: DC | PRN
Start: 2024-04-02 — End: 2024-04-02
  Administered 2024-04-02: 10 mg via INTRAVENOUS

## 2024-04-02 MED ORDER — PHENYLEPHRINE 80 MCG/ML (10ML) SYRINGE FOR IV PUSH (FOR BLOOD PRESSURE SUPPORT)
PREFILLED_SYRINGE | INTRAVENOUS | Status: DC | PRN
  Administered 2024-04-02 (×2): 160 ug via INTRAVENOUS
  Administered 2024-04-02: 80 ug via INTRAVENOUS

## 2024-04-02 NOTE — Discharge Instructions (Signed)
  Colonoscopy Discharge Instructions  Read the instructions outlined below and refer to this sheet in the next few weeks. These discharge instructions provide you with general information on caring for yourself after you leave the hospital. Your doctor may also give you specific instructions. While your treatment has been planned according to the most current medical practices available, unavoidable complications occasionally occur. If you have any problems or questions after discharge, call Dr. Shaaron at [phone]. ACTIVITY You may resume your regular activity, but move at a slower pace for the next 24 hours.  Take frequent rest periods for the next 24 hours.  Walking will help get rid of the air and reduce the bloated feeling in your belly (abdomen).  No driving for 24 hours (because of the medicine (anesthesia) used during the test).   Do not sign any important legal documents or operate any machinery for 24 hours (because of the anesthesia used during the test).  NUTRITION Drink plenty of fluids.  You may resume your normal diet as instructed by your doctor.  Begin with a light meal and progress to your normal diet. Heavy or fried foods are harder to digest and may make you feel sick to your stomach (nauseated).  Avoid alcoholic beverages for 24 hours or as instructed.  MEDICATIONS You may resume your normal medications unless your doctor tells you otherwise.  WHAT YOU CAN EXPECT TODAY Some feelings of bloating in the abdomen.  Passage of more gas than usual.  Spotting of blood in your stool or on the toilet paper.  IF YOU HAD POLYPS REMOVED DURING THE COLONOSCOPY: No aspirin  products for 7 days or as instructed.  No alcohol  for 7 days or as instructed.  Eat a soft diet for the next 24 hours.  FINDING OUT THE RESULTS OF YOUR TEST Not all test results are available during your visit. If your test results are not back during the visit, make an appointment with your caregiver to find out the  results. Do not assume everything is normal if you have not heard from your caregiver or the medical facility. It is important for you to follow up on all of your test results.  SEEK IMMEDIATE MEDICAL ATTENTION IF: You have more than a spotting of blood in your stool.  Your belly is swollen (abdominal distention).  You are nauseated or vomiting.  You have a temperature over 101.  You have abdominal pain or discomfort that is severe or gets worse throughout the day.     No polyps found today.  A future colonoscopy for screening purposes is not recommended.  Resume Eliquis  today  Office visit with Sonny Kerns in 4 to 6 weeks  May benefit from further evaluation of constipation via a test called anorectal manometry  Further recommendations to follow  At patient request, I called Anjelina Dung at [phone] -rolled to voicemail.  Left a detailed message

## 2024-04-02 NOTE — Anesthesia Preprocedure Evaluation (Signed)
 Anesthesia Evaluation  Patient identified by MRN, date of birth, ID band Patient awake    Reviewed: Allergy & Precautions, H&P , NPO status , Patient's Chart, lab work & pertinent test results, reviewed documented beta blocker date and time   Airway Mallampati: II  TM Distance: >3 FB Neck ROM: full    Dental no notable dental hx.    Pulmonary neg pulmonary ROS, former smoker   Pulmonary exam normal breath sounds clear to auscultation       Cardiovascular Exercise Tolerance: Good hypertension, + dysrhythmias + pacemaker  Rhythm:regular Rate:Normal     Neuro/Psych negative neurological ROS  negative psych ROS   GI/Hepatic Neg liver ROS,GERD  ,,  Endo/Other  negative endocrine ROS    Renal/GU negative Renal ROS  negative genitourinary   Musculoskeletal   Abdominal   Peds  Hematology negative hematology ROS (+)   Anesthesia Other Findings   Reproductive/Obstetrics negative OB ROS                             Anesthesia Physical Anesthesia Plan  ASA: 3  Anesthesia Plan: General   Post-op Pain Management:    Induction:   PONV Risk Score and Plan: Propofol  infusion  Airway Management Planned:   Additional Equipment:   Intra-op Plan:   Post-operative Plan:   Informed Consent: I have reviewed the patients History and Physical, chart, labs and discussed the procedure including the risks, benefits and alternatives for the proposed anesthesia with the patient or authorized representative who has indicated his/her understanding and acceptance.     Dental Advisory Given  Plan Discussed with: CRNA  Anesthesia Plan Comments:        Anesthesia Quick Evaluation

## 2024-04-02 NOTE — Anesthesia Procedure Notes (Signed)
 Date/Time: 04/02/2024 8:45 AM  Performed by: Para Jerelene CROME, CRNAOxygen Delivery Method: Nasal cannula

## 2024-04-02 NOTE — Op Note (Signed)
 [facility] Patient Name: Tiffany Melton Procedure Date: 04/02/2024 8:02 AM MRN: [id number] Date of Birth: 08-04-1951 Attending MD: Lamar Ozell Hollingshead , MD, [id number] CSN: [id number] Age: 73 Admit Type: Outpatient Procedure:                Colonoscopy Indications:              Screening for colorectal malignant neoplasm Providers:                Lamar Ozell Hollingshead, MD, Jon LABOR. Gerome RN, RN,                            Dorcas Lenis, Technician Referring MD:              Medicines:                Propofol  per Anesthesia Complications:            No immediate complications. Estimated Blood Loss:     Estimated blood loss: none. Procedure:                Pre-Anesthesia Assessment:                           - Prior to the procedure, a History and Physical                            was performed, and patient medications and                            allergies were reviewed. The patient's tolerance of                            previous anesthesia was also reviewed. The risks                            and benefits of the procedure and the sedation                            options and risks were discussed with the patient.                            All questions were answered, and informed consent                            was obtained. Prior Anticoagulants: The patient                            last took Eliquis  (apixaban ) 3 days prior to the                            procedure. ASA Grade Assessment: III - A patient                            with severe systemic disease. After reviewing the  risks and benefits, the patient was deemed in                            satisfactory condition to undergo the procedure.                           After obtaining informed consent, the colonoscope                            was passed under direct vision. Throughout the                            procedure, the patient's blood pressure, pulse, and                             oxygen saturations were monitored continuously. The                            [id number] scope was introduced through the                            anus and advanced to the the colocolonic                            anastomosis. The colonoscopy was performed without                            difficulty. The patient tolerated the procedure                            well. The quality of the bowel preparation was                            adequate. The ileocecal valve, appendiceal orifice,                            and rectum were photographed. Scope In: 8:48:42 AM Scope Out: 9:07:04 AM Scope Withdrawal Time: 0 hours 6 minutes 22 seconds  Total Procedure Duration: 0 hours 18 minutes 22 seconds  Findings:      The perianal and digital rectal examinations were normal. Long,       redundant colon. 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 Strategically placed external abdominal pressure required to reach       the cecum.      The exam was otherwise without abnormality on direct and retroflexion       views. Impression:               - Long, redundant colon. Melanosis coli. The                            examination was otherwise normal on direct and                            retroflexion views.                           -  No specimens collected. Moderate Sedation:      Moderate (conscious) sedation was personally administered by an       anesthesia professional. The following parameters were monitored: oxygen       saturation, heart rate, blood pressure, respiratory rate, EKG, adequacy       of pulmonary ventilation, and response to care. Recommendation:           - Patient has a contact number available for                            emergencies. The signs and symptoms of potential                            delayed complications were discussed with the                            patient. Return to normal activities tomorrow.                             Written discharge instructions were provided to the                            patient.                           - Advance diet as tolerated.                           - Continue present medications.                           - No repeat colonoscopy due to age.                           - Return to GI clinic in 8 weeks. Will consider                            further evaluation of constipation via anorectal                            manometry in the near future. Procedure Code(s):        --- Professional ---                           [phone], Colonoscopy, flexible; diagnostic, including                            collection of specimen(s) by brushing or washing,                            when performed (separate procedure) Diagnosis Code(s):        --- Professional ---                           Z12.11, Encounter for screening for malignant  neoplasm of colon CPT copyright 2022 American Medical Association. All rights reserved. The codes documented in this report are preliminary and upon coder review may  be revised to meet current compliance requirements. [name] [name], MD Lamar Ozell Hollingshead, MD 04/02/2024 9:26:50 AM This report has been signed electronically. Number of Addenda: 0

## 2024-04-02 NOTE — Transfer of Care (Signed)
 Immediate Anesthesia Transfer of Care Note  Patient: Tiffany Melton  Procedure(s) Performed: COLONOSCOPY  Patient Location: Short Stay  Anesthesia Type:General  Level of Consciousness: drowsy and patient cooperative  Airway & Oxygen Therapy: Patient Spontanous Breathing  Post-op Assessment: Report given to RN and Post -op Vital signs reviewed and stable  Post vital signs: Reviewed and stable  Last Vitals:  Vitals Value Taken Time  BP 110/54 04/02/24 09:18  Temp 36.4 C 04/02/24 09:18  Pulse 61 04/02/24 09:18  Resp 17 04/02/24 09:18  SpO2 99 % 04/02/24 09:18    Last Pain:  Vitals:   04/02/24 0918  TempSrc: Oral  PainSc: 0-No pain      Patients Stated Pain Goal: 8 (04/02/24 0807)  Complications: No notable events documented.

## 2024-04-02 NOTE — Anesthesia Postprocedure Evaluation (Signed)
 Anesthesia Post Note  Patient: Tiffany Melton  Procedure(s) Performed: COLONOSCOPY  Patient location during evaluation: Phase II Anesthesia Type: General Level of consciousness: awake Pain management: pain level controlled Vital Signs Assessment: post-procedure vital signs reviewed and stable Respiratory status: spontaneous breathing and respiratory function stable Cardiovascular status: blood pressure returned to baseline and stable Postop Assessment: no headache and no apparent nausea or vomiting Anesthetic complications: no Comments: Late entry   No notable events documented.   Last Vitals:  Vitals:   04/02/24 0813 04/02/24 0918  BP: 108/63 (!) 110/54  Pulse:  61  Resp:  17  Temp:  (!) 36.4 C  SpO2:  99%    Last Pain:  Vitals:   04/02/24 0918  TempSrc: Oral  PainSc: 0-No pain                 Yvonna JINNY Bosworth

## 2024-04-02 NOTE — H&P (Addendum)
 @LOGO @   Primary Care Physician:  Dow Longs, PA-C Primary Gastroenterologist:  Dr.   Pre-Procedure History & Physical: HPI:  Tiffany Melton is a 73 y.o. female here for screening colonoscopy chronic constipation managed with Linzess.  Eliquis  held 3 days ago.  Past Medical History:  Diagnosis Date   Abnormal stress test    GXT 9/13, cath revealed normal coronary arteries   Dysrhythmia    GERD (gastroesophageal reflux disease)    Pacemaker    Paroxysmal atrial fibrillation (HCC)    Raynaud's disease    Sick sinus syndrome (HCC)     Past Surgical History:  Procedure Laterality Date   ATRIAL FIBRILLATION ABLATION N/A 10/26/2023   Procedure: ATRIAL FIBRILLATION ABLATION;  Surgeon: Tiffany Eulas BRAVO, MD;  Location: [facility]AS[facility];  Service: Cardiovascular;  Laterality: N/A;   BACK SURGERY     x2   BUNIONECTOMY WITH WEIL OSTEOTOMY Right    CARDIAC CATHETERIZATION  10/10/2011   CHOLECYSTECTOMY     Believes she had her appendix out at the same time.   PACEMAKER INSERTION  05/04/2014   MDT Advisa dual chamber pacemaker implanted by Dr Tiffany Melton for SSS, atrial fibrillation post termination pauses   PERMANENT PACEMAKER INSERTION N/A 05/04/2014   Procedure: PERMANENT PACEMAKER INSERTION;  Surgeon: Tiffany JAYSON Fernande, MD;  Location: [facility] CATH LAB;  Service: Cardiovascular;  Laterality: N/A;   TONSILLECTOMY      Prior to Admission medications   Medication Sig Start Date End Date Taking? Authorizing Provider  albuterol  (VENTOLIN  HFA) 108 (90 Base) MCG/ACT inhaler Inhale 2 puffs into the lungs every 6 (six) hours as needed. 11/21/23  Yes [provider]  Calcium Carb-Cholecalciferol (CALCIUM 600 + D PO) Take 1 tablet by mouth 2 (two) times daily.   Yes [provider]  calcium carbonate (TUMS EX) 750 MG chewable tablet Chew 2 tablets by mouth daily as needed for heartburn.   Yes [provider]  diltiazem  (CARDIZEM  CD) 240 MG 24 hr capsule TAKE ONE CAPSULE BY MOUTH  DAILY 11/19/23  Yes Debera Jayson MATSU, MD  GEMTESA  75 MG TABS TAKE 1 TABLET BY MOUTH DAILY 11/22/23  Yes McKenzie, Belvie CROME, MD  linaclotide [facility]) 290 MCG CAPS capsule Take 1 capsule (290 mcg total) by mouth daily before breakfast. 03/26/24  Yes Ezzard Sonny RAMAN, PA-C  Multiple Vitamins-Minerals (ADULT GUMMY PO) Taking 2 gummie's by mouth daily   Yes [provider]  pantoprazole  (PROTONIX ) 20 MG tablet TAKE 1 TABLET DAILY 04/30/23  Yes Debera Jayson MATSU, MD  simvastatin  (ZOCOR ) 20 MG tablet Take 1 tablet (20 mg total) by mouth at bedtime. 02/04/24  Yes Debera Jayson MATSU, MD  Sod Picosulfate-Mag Ox-Cit Acd (CLENPIQ ) 10-3.5-12 MG-GM -GM/175ML SOLN Take 1 kit by mouth as directed. 02/12/24  Yes Chavez Rosol, Tiffany HERO, MD  traZODone (DESYREL) 100 MG tablet Take 50 mg by mouth at bedtime.   Yes [provider]  acetaminophen  (TYLENOL ) 500 MG tablet Take 1,000 mg by mouth at bedtime.    [provider]  diltiazem  (CARDIZEM ) 30 MG tablet Take 1 tablet (30 mg total) by mouth as needed. 12/12/19   Allred, [name], MD  ELIQUIS  5 MG TABS tablet TAKE 1 TABLET BY MOUTH TWICE DAILY 12/19/23   Debera Jayson MATSU, MD    Allergies as of 02/11/2024   (No Known Allergies)    Family History  Problem Relation Age of Onset   Stomach cancer Father    Hypertension Other    Colon cancer  Neg Hx     Social History   Socioeconomic History   Marital status: Widowed    Spouse name: Not on file   Number of children: Not on file   Years of education: Not on file   Highest education level: Not on file  Occupational History   Not on file  Tobacco Use   Smoking status: Former    Current packs/day: 0.00    Average packs/day: 1 pack/day for 35.0 years (35.0 ttl pk-yrs)    Types: Cigarettes    Start date: 10/09/1962    Quit date: 08/09/1982    Years since quitting: 41.6   Smokeless tobacco: Never  Vaping Use   Vaping status: Never Used  Substance and Sexual Activity   Alcohol  use: No     Alcohol /week: 0.0 standard drinks of alcohol    Drug use: No   Sexual activity: Not on file  Other Topics Concern   Not on file  Social History Narrative   Not on file   Social Drivers of Health   Financial Resource Strain: Not on file  Food Insecurity: Not on file  Transportation Needs: Not on file  Physical Activity: Not on file  Stress: Not on file  Social Connections: Not on file  Intimate Partner Violence: Not on file    Review of Systems: See HPI, otherwise negative ROS  Physical Exam: BP 108/63   Pulse 65   Temp 97.8 F (36.6 C) (Oral)   Resp 18   SpO2 99%  General:   Alert,  Well-developed, well-nourished, pleasant and cooperative in NAD Neck:  Supple; no masses or thyromegaly. No significant cervical adenopathy. Lungs:  Clear throughout to auscultation.   No wheezes, crackles, or rhonchi. No acute distress. Heart:  Regular rate and rhythm; no murmurs, clicks, rubs,  or gallops. Abdomen: Non-distended, normal bowel sounds.  Soft and nontender without appreciable mass or hepatosplenomegaly.   Impression/Plan: 73 year old lady here for a screening colonoscopy.  The risks, benefits, limitations, alternatives and imponderables have been reviewed with the patient. Questions have been answered. All parties are agreeable.       Notice: This dictation was prepared with Dragon dictation along with smaller phrase technology. Any transcriptional errors that result from this process are unintentional and may not be corrected upon review.

## 2024-04-03 ENCOUNTER — Encounter (HOSPITAL_COMMUNITY): Payer: Self-pay | Admitting: Internal Medicine

## 2024-04-08 ENCOUNTER — Ambulatory Visit (INDEPENDENT_AMBULATORY_CARE_PROVIDER_SITE_OTHER): Payer: Medicare Other

## 2024-04-08 DIAGNOSIS — I495 Sick sinus syndrome: Secondary | ICD-10-CM

## 2024-04-10 ENCOUNTER — Other Ambulatory Visit: Payer: Self-pay | Admitting: Cardiology

## 2024-04-10 LAB — CUP PACEART REMOTE DEVICE CHECK
Battery Remaining Longevity: 5 mo
Battery Voltage: 2.85 V
Brady Statistic AP VP Percent: 1.34 %
Brady Statistic AP VS Percent: 80.69 %
Brady Statistic AS VP Percent: 0.16 %
Brady Statistic AS VS Percent: 17.81 %
Brady Statistic RA Percent Paced: 81.19 %
Brady Statistic RV Percent Paced: 1.63 %
Date Time Interrogation Session: 20250702172101
Implantable Lead Connection Status: 753985
Implantable Lead Connection Status: 753985
Implantable Lead Implant Date: 20150727
Implantable Lead Implant Date: 20150727
Implantable Lead Location: 753859
Implantable Lead Location: 753860
Implantable Lead Model: 5076
Implantable Lead Model: 5076
Implantable Pulse Generator Implant Date: 20150727
Lead Channel Impedance Value: 361 Ohm
Lead Channel Impedance Value: 437 Ohm
Lead Channel Impedance Value: 494 Ohm
Lead Channel Impedance Value: 494 Ohm
Lead Channel Pacing Threshold Amplitude: 0.75 V
Lead Channel Pacing Threshold Amplitude: 2.5 V
Lead Channel Pacing Threshold Pulse Width: 0.4 ms
Lead Channel Pacing Threshold Pulse Width: 0.4 ms
Lead Channel Sensing Intrinsic Amplitude: 0.25 mV
Lead Channel Sensing Intrinsic Amplitude: 0.25 mV
Lead Channel Sensing Intrinsic Amplitude: 3.5 mV
Lead Channel Sensing Intrinsic Amplitude: 3.5 mV
Lead Channel Setting Pacing Amplitude: 2 V
Lead Channel Setting Pacing Amplitude: 3.5 V
Lead Channel Setting Pacing Pulse Width: 1 ms
Lead Channel Setting Sensing Sensitivity: 2 mV
Zone Setting Status: 755011
Zone Setting Status: 755011

## 2024-04-14 ENCOUNTER — Ambulatory Visit: Payer: Self-pay | Admitting: Cardiovascular Disease

## 2024-04-23 DIAGNOSIS — R509 Fever, unspecified: Secondary | ICD-10-CM | POA: Diagnosis not present

## 2024-04-23 DIAGNOSIS — R030 Elevated blood-pressure reading, without diagnosis of hypertension: Secondary | ICD-10-CM | POA: Diagnosis not present

## 2024-04-23 DIAGNOSIS — L237 Allergic contact dermatitis due to plants, except food: Secondary | ICD-10-CM | POA: Diagnosis not present

## 2024-04-23 DIAGNOSIS — U071 COVID-19: Secondary | ICD-10-CM | POA: Diagnosis not present

## 2024-05-21 DIAGNOSIS — R5382 Chronic fatigue, unspecified: Secondary | ICD-10-CM | POA: Diagnosis not present

## 2024-05-21 DIAGNOSIS — Z0001 Encounter for general adult medical examination with abnormal findings: Secondary | ICD-10-CM | POA: Diagnosis not present

## 2024-05-21 DIAGNOSIS — D519 Vitamin B12 deficiency anemia, unspecified: Secondary | ICD-10-CM | POA: Diagnosis not present

## 2024-05-21 DIAGNOSIS — Z13220 Encounter for screening for lipoid disorders: Secondary | ICD-10-CM | POA: Diagnosis not present

## 2024-05-21 DIAGNOSIS — Z1329 Encounter for screening for other suspected endocrine disorder: Secondary | ICD-10-CM | POA: Diagnosis not present

## 2024-05-21 DIAGNOSIS — E559 Vitamin D deficiency, unspecified: Secondary | ICD-10-CM | POA: Diagnosis not present

## 2024-05-28 DIAGNOSIS — R4582 Worries: Secondary | ICD-10-CM | POA: Diagnosis not present

## 2024-05-28 DIAGNOSIS — F5101 Primary insomnia: Secondary | ICD-10-CM | POA: Diagnosis not present

## 2024-05-28 DIAGNOSIS — Z0000 Encounter for general adult medical examination without abnormal findings: Secondary | ICD-10-CM | POA: Diagnosis not present

## 2024-05-28 DIAGNOSIS — Z6828 Body mass index (BMI) 28.0-28.9, adult: Secondary | ICD-10-CM | POA: Diagnosis not present

## 2024-05-28 DIAGNOSIS — Z1389 Encounter for screening for other disorder: Secondary | ICD-10-CM | POA: Diagnosis not present

## 2024-05-28 DIAGNOSIS — I4891 Unspecified atrial fibrillation: Secondary | ICD-10-CM | POA: Diagnosis not present

## 2024-05-28 DIAGNOSIS — Z1331 Encounter for screening for depression: Secondary | ICD-10-CM | POA: Diagnosis not present

## 2024-06-09 ENCOUNTER — Ambulatory Visit

## 2024-06-11 LAB — CUP PACEART REMOTE DEVICE CHECK
Battery Remaining Longevity: 3 mo
Battery Voltage: 2.85 V
Brady Statistic AP VP Percent: 0.53 %
Brady Statistic AP VS Percent: 85.03 %
Brady Statistic AS VP Percent: 0.1 %
Brady Statistic AS VS Percent: 14.34 %
Brady Statistic RA Percent Paced: 85.05 %
Brady Statistic RV Percent Paced: 0.72 %
Date Time Interrogation Session: 20250903062540
Implantable Lead Connection Status: 753985
Implantable Lead Connection Status: 753985
Implantable Lead Implant Date: 20150727
Implantable Lead Implant Date: 20150727
Implantable Lead Location: 753859
Implantable Lead Location: 753860
Implantable Lead Model: 5076
Implantable Lead Model: 5076
Implantable Pulse Generator Implant Date: 20150727
Lead Channel Impedance Value: 418 Ohm
Lead Channel Impedance Value: 456 Ohm
Lead Channel Impedance Value: 513 Ohm
Lead Channel Impedance Value: 532 Ohm
Lead Channel Pacing Threshold Amplitude: 0.875 V
Lead Channel Pacing Threshold Amplitude: 2.5 V
Lead Channel Pacing Threshold Pulse Width: 0.4 ms
Lead Channel Pacing Threshold Pulse Width: 0.4 ms
Lead Channel Sensing Intrinsic Amplitude: 0.375 mV
Lead Channel Sensing Intrinsic Amplitude: 0.375 mV
Lead Channel Sensing Intrinsic Amplitude: 2.875 mV
Lead Channel Sensing Intrinsic Amplitude: 2.875 mV
Lead Channel Setting Pacing Amplitude: 2 V
Lead Channel Setting Pacing Amplitude: 3.5 V
Lead Channel Setting Pacing Pulse Width: 1 ms
Lead Channel Setting Sensing Sensitivity: 2 mV
Zone Setting Status: 755011
Zone Setting Status: 755011

## 2024-06-17 ENCOUNTER — Other Ambulatory Visit: Payer: Self-pay | Admitting: Urology

## 2024-06-17 ENCOUNTER — Other Ambulatory Visit: Payer: Self-pay | Admitting: Cardiology

## 2024-06-17 DIAGNOSIS — N3281 Overactive bladder: Secondary | ICD-10-CM

## 2024-06-17 DIAGNOSIS — I480 Paroxysmal atrial fibrillation: Secondary | ICD-10-CM

## 2024-06-17 NOTE — Telephone Encounter (Signed)
 Prescription refill request for Eliquis  received. Indication: a fib Last office visit: 02/08/24 Scr: 0.75 epic 11/03/23 Age: 73 Weight: 81kg

## 2024-07-10 ENCOUNTER — Ambulatory Visit

## 2024-07-10 DIAGNOSIS — I480 Paroxysmal atrial fibrillation: Secondary | ICD-10-CM

## 2024-07-11 LAB — CUP PACEART REMOTE DEVICE CHECK
Battery Remaining Longevity: 2 mo
Battery Voltage: 2.84 V
Brady Statistic AP VP Percent: 3.11 %
Brady Statistic AP VS Percent: 87.19 %
Brady Statistic AS VP Percent: 0.21 %
Brady Statistic AS VS Percent: 9.48 %
Brady Statistic RA Percent Paced: 90.04 %
Brady Statistic RV Percent Paced: 3.64 %
Date Time Interrogation Session: 20250930082322
Implantable Lead Connection Status: 753985
Implantable Lead Connection Status: 753985
Implantable Lead Implant Date: 20150727
Implantable Lead Implant Date: 20150727
Implantable Lead Location: 753859
Implantable Lead Location: 753860
Implantable Lead Model: 5076
Implantable Lead Model: 5076
Implantable Pulse Generator Implant Date: 20150727
Lead Channel Impedance Value: 361 Ohm
Lead Channel Impedance Value: 437 Ohm
Lead Channel Impedance Value: 494 Ohm
Lead Channel Impedance Value: 494 Ohm
Lead Channel Pacing Threshold Amplitude: 0.75 V
Lead Channel Pacing Threshold Amplitude: 2.5 V
Lead Channel Pacing Threshold Pulse Width: 0.4 ms
Lead Channel Pacing Threshold Pulse Width: 0.4 ms
Lead Channel Sensing Intrinsic Amplitude: 1.75 mV
Lead Channel Sensing Intrinsic Amplitude: 1.75 mV
Lead Channel Sensing Intrinsic Amplitude: 3.75 mV
Lead Channel Sensing Intrinsic Amplitude: 3.75 mV
Lead Channel Setting Pacing Amplitude: 2 V
Lead Channel Setting Pacing Amplitude: 3.5 V
Lead Channel Setting Pacing Pulse Width: 1 ms
Lead Channel Setting Sensing Sensitivity: 2 mV
Zone Setting Status: 755011
Zone Setting Status: 755011

## 2024-07-11 NOTE — Progress Notes (Signed)
 Remote PPM Transmission

## 2024-07-17 NOTE — Progress Notes (Signed)
 Remote PPM Transmission

## 2024-07-21 ENCOUNTER — Ambulatory Visit: Payer: Self-pay | Admitting: Cardiovascular Disease

## 2024-07-24 ENCOUNTER — Encounter: Payer: Self-pay | Admitting: Internal Medicine

## 2024-07-24 ENCOUNTER — Other Ambulatory Visit: Payer: Self-pay | Admitting: Cardiology

## 2024-07-30 DIAGNOSIS — N6002 Solitary cyst of left breast: Secondary | ICD-10-CM | POA: Diagnosis not present

## 2024-07-30 DIAGNOSIS — Z6829 Body mass index (BMI) 29.0-29.9, adult: Secondary | ICD-10-CM | POA: Diagnosis not present

## 2024-07-30 DIAGNOSIS — R051 Acute cough: Secondary | ICD-10-CM | POA: Diagnosis not present

## 2024-07-31 ENCOUNTER — Other Ambulatory Visit (HOSPITAL_COMMUNITY): Payer: Self-pay | Admitting: Family Medicine

## 2024-07-31 DIAGNOSIS — N6452 Nipple discharge: Secondary | ICD-10-CM

## 2024-08-11 ENCOUNTER — Ambulatory Visit

## 2024-08-12 ENCOUNTER — Inpatient Hospital Stay
Admission: RE | Admit: 2024-08-12 | Discharge: 2024-08-12 | Disposition: A | Discharge status: Home / Self Care | Payer: Self-pay | Source: Ambulatory Visit | Attending: Family Medicine | Admitting: Family Medicine

## 2024-08-12 ENCOUNTER — Inpatient Hospital Stay
Admission: RE | Admit: 2024-08-12 | Discharge: 2024-08-12 | Disposition: A | Payer: Self-pay | Source: Ambulatory Visit | Attending: Family Medicine | Admitting: Family Medicine

## 2024-08-12 ENCOUNTER — Other Ambulatory Visit (HOSPITAL_COMMUNITY): Payer: Self-pay | Admitting: Family Medicine

## 2024-08-12 ENCOUNTER — Ambulatory Visit (HOSPITAL_COMMUNITY)
Admission: RE | Admit: 2024-08-12 | Discharge: 2024-08-12 | Disposition: A | Discharge status: Home / Self Care | Source: Ambulatory Visit | Attending: Family Medicine | Admitting: Family Medicine

## 2024-08-12 ENCOUNTER — Ambulatory Visit (HOSPITAL_COMMUNITY)
Admission: RE | Admit: 2024-08-12 | Discharge: 2024-08-12 | Disposition: A | Source: Ambulatory Visit | Attending: Family Medicine | Admitting: Family Medicine

## 2024-08-12 ENCOUNTER — Encounter (HOSPITAL_COMMUNITY): Payer: Self-pay

## 2024-08-12 DIAGNOSIS — N6452 Nipple discharge: Secondary | ICD-10-CM

## 2024-08-12 DIAGNOSIS — R92322 Mammographic fibroglandular density, left breast: Secondary | ICD-10-CM | POA: Diagnosis not present

## 2024-08-12 LAB — CUP PACEART REMOTE DEVICE CHECK
Battery Remaining Longevity: 2 mo
Battery Voltage: 2.83 V
Brady Statistic AP VP Percent: 0.8 %
Brady Statistic AP VS Percent: 76.21 %
Brady Statistic AS VP Percent: 0.52 %
Brady Statistic AS VS Percent: 22.47 %
Brady Statistic RA Percent Paced: 76.39 %
Brady Statistic RV Percent Paced: 1.51 %
Date Time Interrogation Session: 20251104155951
Implantable Lead Connection Status: 753985
Implantable Lead Connection Status: 753985
Implantable Lead Implant Date: 20150727
Implantable Lead Implant Date: 20150727
Implantable Lead Location: 753859
Implantable Lead Location: 753860
Implantable Lead Model: 5076
Implantable Lead Model: 5076
Implantable Pulse Generator Implant Date: 20150727
Lead Channel Impedance Value: 361 Ohm
Lead Channel Impedance Value: 437 Ohm
Lead Channel Impedance Value: 494 Ohm
Lead Channel Impedance Value: 494 Ohm
Lead Channel Pacing Threshold Amplitude: 0.75 V
Lead Channel Pacing Threshold Amplitude: 2.5 V
Lead Channel Pacing Threshold Pulse Width: 0.4 ms
Lead Channel Pacing Threshold Pulse Width: 0.4 ms
Lead Channel Sensing Intrinsic Amplitude: 1.875 mV
Lead Channel Sensing Intrinsic Amplitude: 1.875 mV
Lead Channel Sensing Intrinsic Amplitude: 3.75 mV
Lead Channel Sensing Intrinsic Amplitude: 3.75 mV
Lead Channel Setting Pacing Amplitude: 2 V
Lead Channel Setting Pacing Amplitude: 3.5 V
Lead Channel Setting Pacing Pulse Width: 1 ms
Lead Channel Setting Sensing Sensitivity: 2 mV
Zone Setting Status: 755011
Zone Setting Status: 755011

## 2024-08-14 ENCOUNTER — Other Ambulatory Visit (HOSPITAL_COMMUNITY): Payer: Self-pay | Admitting: Family Medicine

## 2024-08-14 DIAGNOSIS — N6452 Nipple discharge: Secondary | ICD-10-CM

## 2024-09-11 ENCOUNTER — Ambulatory Visit: Attending: Cardiovascular Disease

## 2024-09-11 ENCOUNTER — Encounter

## 2024-09-13 LAB — CUP PACEART REMOTE DEVICE CHECK
Battery Remaining Longevity: 2 mo
Battery Voltage: 2.83 V
Brady Statistic AP VP Percent: 1.81 %
Brady Statistic AP VS Percent: 75.75 %
Brady Statistic AS VP Percent: 0.91 %
Brady Statistic AS VS Percent: 21.53 %
Brady Statistic RA Percent Paced: 76.99 %
Brady Statistic RV Percent Paced: 3 %
Date Time Interrogation Session: 20251205103044
Implantable Lead Connection Status: 753985
Implantable Lead Connection Status: 753985
Implantable Lead Implant Date: 20150727
Implantable Lead Implant Date: 20150727
Implantable Lead Location: 753859
Implantable Lead Location: 753860
Implantable Lead Model: 5076
Implantable Lead Model: 5076
Implantable Pulse Generator Implant Date: 20150727
Lead Channel Impedance Value: 418 Ohm
Lead Channel Impedance Value: 437 Ohm
Lead Channel Impedance Value: 494 Ohm
Lead Channel Impedance Value: 532 Ohm
Lead Channel Pacing Threshold Amplitude: 0.875 V
Lead Channel Pacing Threshold Amplitude: 2.25 V
Lead Channel Pacing Threshold Pulse Width: 0.4 ms
Lead Channel Pacing Threshold Pulse Width: 0.4 ms
Lead Channel Sensing Intrinsic Amplitude: 2.125 mV
Lead Channel Sensing Intrinsic Amplitude: 2.125 mV
Lead Channel Sensing Intrinsic Amplitude: 2.5 mV
Lead Channel Sensing Intrinsic Amplitude: 2.5 mV
Lead Channel Setting Pacing Amplitude: 2 V
Lead Channel Setting Pacing Amplitude: 3.5 V
Lead Channel Setting Pacing Pulse Width: 1 ms
Lead Channel Setting Sensing Sensitivity: 2 mV
Zone Setting Status: 755011
Zone Setting Status: 755011

## 2024-09-15 ENCOUNTER — Telehealth: Payer: Self-pay

## 2024-09-15 NOTE — Telephone Encounter (Signed)
 PPM Monthly battery check.  Device at [facility] on 09/08/24, routing to triage. Normal device function. No new alerts. Follow up as scheduled monthly  Had discussion regarding gen change with patient at May 2/25 OV with Dr. Nancey.  Forwarding to nurse/scheduling team for next steps.  Note: patient has RV lead at high output.  She has known hx of RV elevation and rarely VP but will bring in this Wednesday to assess lead function and see if we can program around. Review at device clinic visit if hard programming is possible to avoid high output. Patient is not device dependent.   Per Dr. Marko documentation at 02/08/24 OV:  RV threshold is chronically elevated.  She does not RV pace and therefore would not require lead revision.  Patient aware and appointment made for 09/17/24 at 1040 am with device clinic to assess.

## 2024-09-17 ENCOUNTER — Ambulatory Visit (HOSPITAL_COMMUNITY)
Admission: RE | Admit: 2024-09-17 | Discharge: 2024-09-17 | Disposition: A | Discharge status: Home / Self Care | Source: Ambulatory Visit | Attending: Family Medicine | Admitting: Family Medicine

## 2024-09-17 ENCOUNTER — Encounter (HOSPITAL_COMMUNITY): Payer: Self-pay

## 2024-09-17 ENCOUNTER — Ambulatory Visit

## 2024-09-17 DIAGNOSIS — I495 Sick sinus syndrome: Secondary | ICD-10-CM | POA: Diagnosis present

## 2024-09-17 DIAGNOSIS — N6452 Nipple discharge: Secondary | ICD-10-CM | POA: Diagnosis present

## 2024-09-17 LAB — CUP PACEART INCLINIC DEVICE CHECK
Date Time Interrogation Session: 20251210121258
Implantable Lead Connection Status: 753985
Implantable Lead Connection Status: 753985
Implantable Lead Implant Date: 20150727
Implantable Lead Implant Date: 20150727
Implantable Lead Location: 753859
Implantable Lead Location: 753860
Implantable Lead Model: 5076
Implantable Lead Model: 5076
Implantable Pulse Generator Implant Date: 20150727

## 2024-09-17 NOTE — Telephone Encounter (Signed)
 Spoke with pt and generator replacement tentatively scheduled for 10/24/2024 with Dr Nancey.  Surgical scrub provided to the patient and advised once procedure is scheduled we will place instructions on pt's MyChart and someone will contact her to review.  Pt verbalizes understanding and agrees with current plan.

## 2024-09-17 NOTE — CV Procedure (Deleted)
°  Device system confirmed to be MRI conditional, with implant date > 6 weeks ago, and no evidence of abandoned or epicardial leads in review of most recent CXR  Device last cleared by EP Provider: Daphne Barrack NP   Clearance is good through for 1 year as long as parameters remain stable at time of check. If pt undergoes a cardiac device procedure during that time, they should be re-cleared.   Tachy-therapies to be programmed off if applicable with device back to pre-MRI settings after completion of exam.  [employer] - Programming recommendation received through [employer] App/Tablet  Emogene Bouchard, RT  09/17/2024 1:02 PM

## 2024-09-17 NOTE — CV Procedure (Deleted)
°  Device system confirmed to be MRI conditional, with implant date > 6 weeks ago, and no evidence of abandoned or epicardial leads in review of most recent CXR  Device last cleared by EP Provider: Daphne Barrack 09/17/2024  Clearance is good through for 1 year as long as parameters remain stable at time of check. If pt undergoes a cardiac device procedure during that time, they should be re-cleared.   Tachy-therapies to be programmed off if applicable with device back to pre-MRI settings after completion of exam.  [employer] - Programming recommendation received through [employer] App/Tablet  Rocky Catalan, RT  09/17/2024 1:02 PM

## 2024-09-17 NOTE — Progress Notes (Signed)
 Patient seen in device clinic today to assess RV threshold value and program around if possible  R wave amplitude measured 3.6 mV today  RV threshold measured 3.5 V @ 1.0 mS today  RV auto capture testing turned OFF  RV output fixed at 4.5 V @ 1.0 mS  RV pacing 1.6% on today's interrogation  Patient seen by Aldona, RN today to schedule procedure date and provide patient with surgical scrub

## 2024-09-17 NOTE — Patient Instructions (Signed)
Patient will follow up as scheduled.

## 2024-09-18 NOTE — Telephone Encounter (Signed)
 Reviewed patient's ongoing elevated RV lead threshold readings with Dr. Nancey.   Confirmed due to lack of pacing in the RV, there are no plans to extract/revise the lead.  Patient will need gen change only scheduled.  Dr. Marko nurse Aldona Marina, RN made aware and will communicate with procedure scheduling team.

## 2024-09-19 ENCOUNTER — Other Ambulatory Visit: Payer: Self-pay

## 2024-09-19 DIAGNOSIS — I495 Sick sinus syndrome: Secondary | ICD-10-CM

## 2024-09-19 DIAGNOSIS — Z01812 Encounter for preprocedural laboratory examination: Secondary | ICD-10-CM

## 2024-09-19 NOTE — Telephone Encounter (Signed)
 Pt's PPM generator change has been scheduled for 10/29/2023.  Pt is aware that full instructions will follow.

## 2024-09-19 NOTE — Progress Notes (Signed)
 Pre-procedure labs - PPM generator change ordered.

## 2024-09-23 ENCOUNTER — Telehealth: Payer: Self-pay

## 2024-09-23 NOTE — Telephone Encounter (Signed)
-----   Message from Nurse Aldona SAUNDERS, RN sent at 09/19/2024 10:27 AM EST ----- Regarding: 01/20 PPM gen change - Mealor Important: list procedure date as first item in subject line, followed by procedure type (e.g., 06/20/24 PPM implant)  Precert:  MD: Mealor Type of implant: PPM Device manufacturer: Medtronic Diagnosis: ERI CPT code: PPM change out, dual - 66771 C-code(s), including quantity (if indicated):  Procedure scheduled (date/time): 01/20 130pm  Procedure:  Scrub given? Yes  Medication instructions: Hold Eliquis  x 48 hours Message sent to CVRR? N/A Added to calendar? Yes Orders entered? No, >30 days before procedure Letter complete? No, >30 days before procedure Scheduled with cath lab? Yes Labs ordered (CBC, BMET, PT/INR if on warfarin)? Yes Dye allergy? No Pre-meds ordered and instructions given? N/A Letter method: MyChart Special instructions: N/A H&P: 5/25  Follow-up:  Cassie/Angel, please schedule Routine.  Covering RN:  Please send this message to [employer], EP scheduler, EP Scheduling pool, and EP [employer].

## 2024-09-25 ENCOUNTER — Ambulatory Visit: Payer: Self-pay | Admitting: Cardiovascular Disease

## 2024-09-26 ENCOUNTER — Ambulatory Visit: Payer: Self-pay | Admitting: Cardiovascular Disease

## 2024-10-06 ENCOUNTER — Ambulatory Visit: Payer: Self-pay | Admitting: Cardiovascular Disease

## 2024-10-06 ENCOUNTER — Other Ambulatory Visit (HOSPITAL_COMMUNITY)
Admission: RE | Admit: 2024-10-06 | Discharge: 2024-10-06 | Disposition: A | Discharge status: Home / Self Care | Source: Ambulatory Visit | Attending: Cardiovascular Disease | Admitting: Cardiovascular Disease

## 2024-10-06 DIAGNOSIS — I495 Sick sinus syndrome: Secondary | ICD-10-CM | POA: Diagnosis present

## 2024-10-06 DIAGNOSIS — Z01812 Encounter for preprocedural laboratory examination: Secondary | ICD-10-CM | POA: Insufficient documentation

## 2024-10-06 LAB — BASIC METABOLIC PANEL WITH GFR
Anion gap: 6 (ref 5–15)
BUN: 20 mg/dL (ref 8–23)
CO2: 33 mmol/L — ABNORMAL HIGH (ref 22–32)
Calcium: 9.3 mg/dL (ref 8.9–10.3)
Chloride: 102 mmol/L (ref 98–111)
Creatinine, Ser: 0.84 mg/dL (ref 0.44–1.00)
GFR, Estimated: >60 mL/min
Glucose, Bld: 88 mg/dL (ref 70–99)
Potassium: 4.2 mmol/L (ref 3.5–5.1)
Sodium: 141 mmol/L (ref 135–145)

## 2024-10-06 LAB — CBC
HCT: 37.2 % (ref 36.0–46.0)
Hemoglobin: 12.2 g/dL (ref 12.0–15.0)
MCH: 32.7 pg (ref 26.0–34.0)
MCHC: 32.8 g/dL (ref 30.0–36.0)
MCV: 99.7 fL (ref 80.0–100.0)
Platelets: 206 K/uL (ref 150–400)
RBC: 3.73 MIL/uL — ABNORMAL LOW (ref 3.87–5.11)
RDW: 13.2 % (ref 11.5–15.5)
WBC: 5 K/uL (ref 4.0–10.5)
nRBC: 0 % (ref 0.0–0.2)

## 2024-10-12 ENCOUNTER — Ambulatory Visit: Attending: Cardiovascular Disease

## 2024-10-12 DIAGNOSIS — I495 Sick sinus syndrome: Secondary | ICD-10-CM | POA: Diagnosis not present

## 2024-10-13 ENCOUNTER — Encounter

## 2024-10-13 ENCOUNTER — Telehealth (HOSPITAL_COMMUNITY): Payer: Self-pay

## 2024-10-13 ENCOUNTER — Encounter (HOSPITAL_COMMUNITY): Payer: Self-pay

## 2024-10-13 NOTE — Telephone Encounter (Signed)
 Spoke with patient to discuss upcoming procedure.   Confirmed patient is scheduled for a PPM generator change on Tuesday, January 20 with Dr. Dr. Nancey. Instructed patient to arrive at the Main Entrance A at [facility]: [address] [place], [place] [address] and check in at Admitting at 11:30 AM.   Labs completed  Any recent signs of acute illness or been started on antibiotics? No  Any new medications started? No Any medications to hold? Yes. HOLD: Eliquis  (Apixaban ) for 2 days prior to your procedure. Your last dose will be Saturday, January 17, PM dose.  Medication instructions:  On the morning of your procedure take all other medications with sips of water .  No eating or drinking after midnight prior to procedure.   The night before your procedure and the morning of your procedure, wash thoroughly with the CHG surgical soap from the neck down, paying special attention to the area where your procedure will be performed.  Plan to go home the same day, you will only stay overnight if medically necessary. You MUST have a responsible adult to drive you home and MUST be with you the first 24 hours after you arrive home.  Informed patient a nurse will call a day before the procedure to confirm arrival time and ensure instructions are followed.  Patient verbalized understanding to all instructions provided and agreed to proceed with procedure.

## 2024-10-15 LAB — CUP PACEART REMOTE DEVICE CHECK
Battery Remaining Longevity: 2 mo
Battery Voltage: 2.82 V
Brady Statistic AP VP Percent: 2.24 %
Brady Statistic AP VS Percent: 76.8 %
Brady Statistic AS VP Percent: 0.77 %
Brady Statistic AS VS Percent: 20.19 %
Brady Statistic RA Percent Paced: 78.47 %
Brady Statistic RV Percent Paced: 3.34 %
Date Time Interrogation Session: 20260106152525
Implantable Lead Connection Status: 753985
Implantable Lead Connection Status: 753985
Implantable Lead Implant Date: 20150727
Implantable Lead Implant Date: 20150727
Implantable Lead Location: 753859
Implantable Lead Location: 753860
Implantable Lead Model: 5076
Implantable Lead Model: 5076
Implantable Pulse Generator Implant Date: 20150727
Lead Channel Impedance Value: 361 Ohm
Lead Channel Impedance Value: 437 Ohm
Lead Channel Impedance Value: 494 Ohm
Lead Channel Impedance Value: 494 Ohm
Lead Channel Pacing Threshold Amplitude: 0.875 V
Lead Channel Pacing Threshold Amplitude: 2.25 V
Lead Channel Pacing Threshold Pulse Width: 0.4 ms
Lead Channel Pacing Threshold Pulse Width: 0.4 ms
Lead Channel Sensing Intrinsic Amplitude: 2.375 mV
Lead Channel Sensing Intrinsic Amplitude: 2.375 mV
Lead Channel Sensing Intrinsic Amplitude: 2.5 mV
Lead Channel Sensing Intrinsic Amplitude: 2.5 mV
Lead Channel Setting Pacing Amplitude: 2 V
Lead Channel Setting Pacing Amplitude: 4.5 V
Lead Channel Setting Pacing Pulse Width: 1 ms
Lead Channel Setting Sensing Sensitivity: 2 mV
Zone Setting Status: 755011
Zone Setting Status: 755011

## 2024-10-16 NOTE — Progress Notes (Signed)
 Remote PPM Transmission

## 2024-10-17 NOTE — Telephone Encounter (Signed)
 Patient reports she has two permanent bracelets that she is unsure of the material, but states when she had an MRI, the magnet did not stick. She inquires will they interfere with her procedure and need to be removed (cut off) prior. States in the past, they were able to be taped prior to other procedures. Advised patient, I don't foresee it being an issue, but will check with cath lab nurse.

## 2024-10-18 ENCOUNTER — Ambulatory Visit: Payer: Self-pay | Admitting: Cardiovascular Disease

## 2024-10-20 NOTE — Telephone Encounter (Addendum)
 Per Delon KIDD., RN, yes they should be fine.

## 2024-10-27 NOTE — Pre-Procedure Instructions (Signed)
 Instructed patient on the following items: Arrival time 1130 Nothing to eat or drink after midnight No meds AM of procedure Responsible person to drive you home and stay with you for 24 hrs Wash with special soap night before and morning of procedure If on anti-coagulant drug instructions Eliquis - stopped 2 days ago.

## 2024-10-28 ENCOUNTER — Ambulatory Visit (HOSPITAL_COMMUNITY)
Admission: RE | Admit: 2024-10-28 | Discharge: 2024-10-28 | Disposition: A | Attending: Cardiovascular Disease | Admitting: Cardiovascular Disease

## 2024-10-28 ENCOUNTER — Ambulatory Visit (HOSPITAL_COMMUNITY): Admission: RE | Disposition: A | Payer: Self-pay | Source: Home / Self Care | Attending: Cardiovascular Disease

## 2024-10-28 ENCOUNTER — Other Ambulatory Visit: Payer: Self-pay

## 2024-10-28 DIAGNOSIS — E663 Overweight: Secondary | ICD-10-CM | POA: Insufficient documentation

## 2024-10-28 DIAGNOSIS — Z6827 Body mass index (BMI) 27.0-27.9, adult: Secondary | ICD-10-CM | POA: Diagnosis not present

## 2024-10-28 DIAGNOSIS — R001 Bradycardia, unspecified: Secondary | ICD-10-CM | POA: Diagnosis not present

## 2024-10-28 DIAGNOSIS — I480 Paroxysmal atrial fibrillation: Secondary | ICD-10-CM | POA: Insufficient documentation

## 2024-10-28 DIAGNOSIS — Z45010 Encounter for checking and testing of cardiac pacemaker pulse generator [battery]: Secondary | ICD-10-CM | POA: Diagnosis not present

## 2024-10-28 DIAGNOSIS — Z7901 Long term (current) use of anticoagulants: Secondary | ICD-10-CM | POA: Diagnosis not present

## 2024-10-28 HISTORY — PX: PPM GENERATOR CHANGEOUT: EP1233

## 2024-10-28 MED ORDER — SODIUM CHLORIDE 0.9 % IV SOLN
INTRAVENOUS | Status: AC
  Administered 2024-10-28: 80 mg
  Filled 2024-10-28: qty 2

## 2024-10-28 MED ORDER — MIDAZOLAM HCL 5 MG/5ML IJ SOLN
INTRAMUSCULAR | Status: DC | PRN
  Administered 2024-10-28 (×2): 1 mg via INTRAVENOUS

## 2024-10-28 MED ORDER — SODIUM CHLORIDE 0.9 % IV SOLN: 80.0000 mg | INTRAVENOUS | Status: AC

## 2024-10-28 MED ORDER — FENTANYL CITRATE (PF) 100 MCG/2ML IJ SOLN
INTRAMUSCULAR | Status: AC
  Filled 2024-10-28: qty 2

## 2024-10-28 MED ORDER — LIDOCAINE HCL 1 % IJ SOLN
INTRAMUSCULAR | Status: AC
  Filled 2024-10-28: qty 60

## 2024-10-28 MED ORDER — CHLORHEXIDINE GLUCONATE 4 % EX SOLN
4.0000 | Freq: Once | CUTANEOUS | Status: DC
  Filled 2024-10-28: qty 60

## 2024-10-28 MED ORDER — APIXABAN 5 MG PO TABS: 5.0000 mg | ORAL_TABLET | Freq: Two times a day (BID) | ORAL | Status: AC

## 2024-10-28 MED ORDER — FENTANYL CITRATE (PF) 100 MCG/2ML IJ SOLN
INTRAMUSCULAR | Status: DC | PRN
  Administered 2024-10-28 (×2): 25 ug via INTRAVENOUS

## 2024-10-28 MED ORDER — CEFAZOLIN SODIUM-DEXTROSE 2-4 GM/100ML-% IV SOLN: 2.0000 g | INTRAVENOUS | Status: AC

## 2024-10-28 MED ORDER — MIDAZOLAM HCL 2 MG/2ML IJ SOLN
INTRAMUSCULAR | Status: AC
  Filled 2024-10-28: qty 2

## 2024-10-28 MED ORDER — ACETAMINOPHEN 325 MG PO TABS: 325.0000 mg | ORAL_TABLET | ORAL | Status: DC | PRN

## 2024-10-28 MED ORDER — POVIDONE-IODINE 10 % EX SWAB: 2.0000 | Freq: Once | CUTANEOUS | Status: DC

## 2024-10-28 MED ORDER — HEPARIN (PORCINE) IN NACL 1000-0.9 UT/500ML-% IV SOLN: INTRAVENOUS | Status: DC | PRN

## 2024-10-28 MED ORDER — SODIUM CHLORIDE 0.9 % IV SOLN: INTRAVENOUS | Status: DC

## 2024-10-28 MED ORDER — ONDANSETRON HCL 4 MG/2ML IJ SOLN: 4.0000 mg | Freq: Four times a day (QID) | INTRAMUSCULAR | Status: DC | PRN

## 2024-10-28 MED ORDER — CEFAZOLIN SODIUM-DEXTROSE 2-4 GM/100ML-% IV SOLN
INTRAVENOUS | Status: AC
  Administered 2024-10-28: 2 g via INTRAVENOUS
  Filled 2024-10-28: qty 100

## 2024-10-28 NOTE — Discharge Instructions (Signed)

## 2024-10-28 NOTE — H&P (Signed)
" ° ° °  PCP: Dow Longs, PA-C Primary Cardiologist: Dr Debera Primary EP:  Dr Melton  Tiffany Melton is a 74 y.o. female who presents today for routine electrophysiology followup.  Since last being seen in our clinic, the patient reports doing reasonably well.    She has paroxysms of rapid palpitations that are very disconcerting to her.  These occur fairly infrequently but medically affect her quality of life. Otherwise, not had any chest pain, presyncope, syncope.  She underwent atrial fibrillation ablation on October 26, 2023.   She reports that she is doing well. She is still having occasional episodes of palpitations. Had some palpitations yesterday.     Physical Exam:  Gen: Appears comfortable, well-nourished CV: RRR, no dependent edema The device site is normal - no tenderness, edema, drainage, redness, threatened erosion. Pulm: breathing easily   Pacemaker interrogation- reviewed in detail today,  See PACEART report   Assessment and Plan:  Symptomatic sinus bradycardia  Normal pacemaker function See Pace Art report No changes today she is not device dependant today RV threshold is chronically elevated.  She does not RV pace and therefore would not require lead revision.  We discussed the battery change including risk of infection, bleeding, damage to existing existing leads.  She will not need additional follow-up prior to scheduling generator change.  Paroxysmal atrial fibrillation and likely atrial flutter Maintaining sinus rhythm after ablation on October 26, 2023 Continue eliquis     overweight Body mass index is 27.52 kg/m. Lifestyle change advised Fasting lipids ordered  Risks, benefits and potential toxicities for medications prescribed and/or refilled reviewed with patient today.    Tiffany FORBES Nancey, MD 10/28/2024 11:59 AM    "

## 2024-10-29 ENCOUNTER — Encounter (HOSPITAL_COMMUNITY): Payer: Self-pay | Admitting: Cardiovascular Disease

## 2024-10-29 MED FILL — Midazolam HCl Inj 2 MG/2ML (Base Equivalent): INTRAMUSCULAR | Qty: 2 | Status: AC

## 2024-10-29 MED FILL — Lidocaine HCl Local Inj 1%: INTRAMUSCULAR | Qty: 60 | Status: AC

## 2024-11-11 ENCOUNTER — Ambulatory Visit

## 2024-11-11 DIAGNOSIS — I495 Sick sinus syndrome: Secondary | ICD-10-CM

## 2024-11-11 NOTE — Patient Instructions (Signed)

## 2024-11-12 ENCOUNTER — Ambulatory Visit

## 2024-11-12 NOTE — Progress Notes (Signed)
 Normal dual chamber pacemaker wound check. Presenting rhythm: AP/VS 70. Wound well healed. Routine testing performed. Thresholds, sensing, impedance stable per trends.  Known high RV threshold, see adjustments below. No episodes.  Histograms are blunted; however, patient reports she is feeling great and no issues with activity.  Generator change only, no arm restrictions.  Pt enrolled in remote follow-up.  PROGRAMMING CHANGE:  RV at HIGH output with Adaptive on upon interrogation.    RV threshold test today:  2.25v@1 .0ms.  RV - turned off adaptive and hard programmed to 3.75v@1 .0ms (1V safety margin, patient VP 0.2%).   Patient and I discussed the RV threshold and reviewed decision not to replace per Dr. Nancey due to never VP and would be more risky to perform v/s providing her any benefit.  Patient verbalizes understanding.

## 2024-11-13 ENCOUNTER — Encounter

## 2024-11-13 LAB — CUP PACEART INCLINIC DEVICE CHECK
Date Time Interrogation Session: 20260203113349
Implantable Lead Connection Status: 753985
Implantable Lead Connection Status: 753985
Implantable Lead Implant Date: 20150727
Implantable Lead Implant Date: 20150727
Implantable Lead Location: 753859
Implantable Lead Location: 753860
Implantable Lead Model: 5076
Implantable Lead Model: 5076
Implantable Pulse Generator Implant Date: 20260120

## 2024-11-19 ENCOUNTER — Ambulatory Visit: Admitting: Cardiology

## 2024-12-13 ENCOUNTER — Ambulatory Visit

## 2024-12-15 ENCOUNTER — Encounter

## 2025-01-13 ENCOUNTER — Ambulatory Visit

## 2025-01-27 ENCOUNTER — Ambulatory Visit: Admitting: Pulmonary Disease

## 2025-04-14 ENCOUNTER — Ambulatory Visit

## 2025-07-14 ENCOUNTER — Ambulatory Visit

## 2025-10-13 ENCOUNTER — Ambulatory Visit

## 2026-01-12 ENCOUNTER — Ambulatory Visit
